# Patient Record
Sex: Female | Born: 1985
Health system: Southern US, Community
[De-identification: ages and names within clinical notes are randomized; demographics above are authoritative.]

## PROBLEM LIST (undated history)

## (undated) DIAGNOSIS — F3281 Premenstrual dysphoric disorder: Secondary | ICD-10-CM

## (undated) DIAGNOSIS — M329 Systemic lupus erythematosus, unspecified: Secondary | ICD-10-CM

## (undated) DIAGNOSIS — M069 Rheumatoid arthritis, unspecified: Secondary | ICD-10-CM

## (undated) HISTORY — DX: Premenstrual dysphoric disorder: F32.81

## (undated) HISTORY — DX: Systemic lupus erythematosus, unspecified: M32.9

## (undated) HISTORY — PX: RENAL BIOPSY: SHX156

## (undated) HISTORY — PX: WRIST SURGERY: SHX841

---
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
## 2004-03-25 ENCOUNTER — Emergency Department (HOSPITAL_COMMUNITY): Admission: EM | Admit: 2004-03-25 | Discharge: 2004-03-25 | Payer: Self-pay | Admitting: *Deleted

## 2006-03-20 ENCOUNTER — Emergency Department (HOSPITAL_COMMUNITY): Admission: EM | Admit: 2006-03-20 | Discharge: 2006-03-20 | Payer: Self-pay | Admitting: Orthopedic Surgery

## 2006-04-04 ENCOUNTER — Emergency Department (HOSPITAL_COMMUNITY): Admission: EM | Admit: 2006-04-04 | Discharge: 2006-04-04 | Payer: Self-pay | Admitting: Family Medicine

## 2008-03-20 ENCOUNTER — Encounter: Admission: RE | Admit: 2008-03-20 | Discharge: 2008-03-20 | Payer: Self-pay | Admitting: Internal Medicine

## 2008-11-19 ENCOUNTER — Emergency Department (HOSPITAL_COMMUNITY): Admission: EM | Admit: 2008-11-19 | Discharge: 2008-11-19 | Payer: Self-pay | Admitting: Emergency Medicine

## 2009-05-06 ENCOUNTER — Emergency Department (HOSPITAL_COMMUNITY): Admission: EM | Admit: 2009-05-06 | Discharge: 2009-05-06 | Payer: Self-pay | Admitting: Emergency Medicine

## 2010-08-30 LAB — POCT URINALYSIS DIP (DEVICE)
Bilirubin Urine: NEGATIVE
Glucose, UA: NEGATIVE mg/dL
Hgb urine dipstick: NEGATIVE
Nitrite: NEGATIVE
Protein, ur: NEGATIVE mg/dL
Specific Gravity, Urine: 1.02 (ref 1.005–1.030)
Urobilinogen, UA: 1 mg/dL (ref 0.0–1.0)
pH: 6.5 (ref 5.0–8.0)

## 2010-08-30 LAB — POCT PREGNANCY, URINE: Preg Test, Ur: NEGATIVE

## 2010-12-07 ENCOUNTER — Emergency Department (HOSPITAL_COMMUNITY)
Admission: EM | Admit: 2010-12-07 | Discharge: 2010-12-07 | Disposition: A | Discharge status: Home / Self Care | Payer: 59 | Attending: Emergency Medicine | Admitting: Emergency Medicine

## 2010-12-07 ENCOUNTER — Emergency Department (HOSPITAL_COMMUNITY): Payer: 59

## 2010-12-07 DIAGNOSIS — IMO0002 Reserved for concepts with insufficient information to code with codable children: Secondary | ICD-10-CM | POA: Insufficient documentation

## 2010-12-07 DIAGNOSIS — S139XXA Sprain of joints and ligaments of unspecified parts of neck, initial encounter: Secondary | ICD-10-CM | POA: Insufficient documentation

## 2010-12-07 DIAGNOSIS — Y92410 Unspecified street and highway as the place of occurrence of the external cause: Secondary | ICD-10-CM | POA: Insufficient documentation

## 2010-12-07 DIAGNOSIS — S96819A Strain of other specified muscles and tendons at ankle and foot level, unspecified foot, initial encounter: Secondary | ICD-10-CM | POA: Insufficient documentation

## 2010-12-07 DIAGNOSIS — Y998 Other external cause status: Secondary | ICD-10-CM | POA: Insufficient documentation

## 2010-12-07 DIAGNOSIS — S93499A Sprain of other ligament of unspecified ankle, initial encounter: Secondary | ICD-10-CM | POA: Insufficient documentation

## 2010-12-07 DIAGNOSIS — J45909 Unspecified asthma, uncomplicated: Secondary | ICD-10-CM | POA: Insufficient documentation

## 2010-12-07 DIAGNOSIS — S6390XA Sprain of unspecified part of unspecified wrist and hand, initial encounter: Secondary | ICD-10-CM | POA: Insufficient documentation

## 2011-10-04 ENCOUNTER — Emergency Department (HOSPITAL_COMMUNITY)
Admission: EM | Admit: 2011-10-04 | Discharge: 2011-10-04 | Disposition: A | Discharge status: Home / Self Care | Payer: BC Managed Care – PPO | Attending: Emergency Medicine | Admitting: Emergency Medicine

## 2011-10-04 ENCOUNTER — Encounter (HOSPITAL_COMMUNITY): Payer: Self-pay | Admitting: Emergency Medicine

## 2011-10-04 ENCOUNTER — Emergency Department (HOSPITAL_COMMUNITY): Payer: BC Managed Care – PPO

## 2011-10-04 DIAGNOSIS — S161XXA Strain of muscle, fascia and tendon at neck level, initial encounter: Secondary | ICD-10-CM

## 2011-10-04 DIAGNOSIS — S0990XA Unspecified injury of head, initial encounter: Secondary | ICD-10-CM | POA: Insufficient documentation

## 2011-10-04 DIAGNOSIS — Y998 Other external cause status: Secondary | ICD-10-CM | POA: Insufficient documentation

## 2011-10-04 DIAGNOSIS — J45909 Unspecified asthma, uncomplicated: Secondary | ICD-10-CM | POA: Insufficient documentation

## 2011-10-04 DIAGNOSIS — M069 Rheumatoid arthritis, unspecified: Secondary | ICD-10-CM | POA: Insufficient documentation

## 2011-10-04 DIAGNOSIS — W010XXA Fall on same level from slipping, tripping and stumbling without subsequent striking against object, initial encounter: Secondary | ICD-10-CM | POA: Insufficient documentation

## 2011-10-04 DIAGNOSIS — R51 Headache: Secondary | ICD-10-CM | POA: Insufficient documentation

## 2011-10-04 DIAGNOSIS — Y9383 Activity, rough housing and horseplay: Secondary | ICD-10-CM | POA: Insufficient documentation

## 2011-10-04 DIAGNOSIS — S139XXA Sprain of joints and ligaments of unspecified parts of neck, initial encounter: Secondary | ICD-10-CM | POA: Insufficient documentation

## 2011-10-04 HISTORY — DX: Rheumatoid arthritis, unspecified: M06.9

## 2011-10-04 MED ORDER — IBUPROFEN 800 MG PO TABS
800.0000 mg | ORAL_TABLET | Freq: Once | ORAL | Status: AC
  Administered 2011-10-04: 800 mg via ORAL
  Filled 2011-10-04: qty 1

## 2011-10-04 NOTE — ED Notes (Signed)
Patient transported to CT 

## 2011-10-04 NOTE — ED Notes (Signed)
Pt jumped up to kiss her boyfriend and fell backward hitting her head on the concrete  Denies LOC  Pt is c/o headache,neck and upper back pain

## 2011-10-04 NOTE — ED Notes (Signed)
Patient returned from CT

## 2011-10-04 NOTE — ED Provider Notes (Signed)
History     CSN: [id number]  Arrival date & time 10/04/11  0122   First MD Initiated Contact with Patient 10/04/11 0140      Chief Complaint  Patient presents with  . Fall  . Head Injury   HPI  History provided by the patient and significant other. Patient is a 26 year old female with history of asthma or presents with complaints of headache following minor head injury and fall. Patient reports playing around with her boyfriend and was jumping up to grab hold of him when she fell backwards onto her back and hitting her at on cement. There was no loss of consciousness. Patient reports having headache following injury with some neck pains. Patient also reports having some sensitivity to light. She denies any nausea or vomiting. Patient denies any dizziness or vertigo symptoms. Patient has been able to tolerate. She denies any other injury. Denies back or chest pain or difficulty breathing. Patient has not taken anything for her symptoms. Symptoms are described as moderate. She denies any other aggravating or alleviating factors.    Past Medical History  Diagnosis Date  . Asthma   . Rheumatoid arthritis     History reviewed. No pertinent past surgical history.  Family History  Problem Relation Age of Onset  . Hypertension Mother   . Arthritis Father   . Hypertension Other   . Diabetes Other   . Arthritis Other     History  Substance Use Topics  . Smoking status: Former Games developer  . Smokeless tobacco: Not on file  . Alcohol Use: Yes     social    OB History    Grav Para Term Preterm Abortions TAB SAB Ect Mult Living                  Review of Systems  Constitutional: Positive for fatigue.  HENT: Positive for neck pain.   Eyes: Negative for pain and visual disturbance.  Gastrointestinal: Negative for nausea and vomiting.  Neurological: Positive for headaches. Negative for dizziness, weakness, light-headedness and numbness.  Psychiatric/Behavioral: Negative for  confusion.    Allergies  Penicillins and Sulfa antibiotics  Home Medications  No current outpatient prescriptions on file.  LMP 09/24/2011  Physical Exam  Nursing note and vitals reviewed. Constitutional: She is oriented to person, place, and time. She appears well-developed and well-nourished. No distress.  HENT:  Head: Normocephalic.  Right Ear: Tympanic membrane normal.  Left Ear: Tympanic membrane normal.       Mild to moderate tenderness of her posterior scalp. Slight fluctuance without significant hematoma. No lacerations. No battle sign or raccoon eyes.  Eyes: Conjunctivae and EOM are normal. Pupils are equal, round, and reactive to light.  Neck: No tracheal deviation present.       Moderate midline tenderness. No step-offs or deformity.  Cardiovascular: Normal rate and regular rhythm.   Pulmonary/Chest: Effort normal and breath sounds normal. No stridor.  Musculoskeletal:       Back:  Neurological: She is alert and oriented to person, place, and time. She has normal strength. No cranial nerve deficit or sensory deficit. Coordination normal.  Skin: Skin is warm and dry. No rash noted.  Psychiatric: She has a normal mood and affect. Her behavior is normal.    ED Course  Procedures      Dg Cervical Spine Complete  10/04/2011  *RADIOLOGY REPORT*  Clinical Data: Status post fall; posterior neck pain.  CERVICAL SPINE - COMPLETE 4+ VIEW  Comparison: Cervical spine radiographs  performed 12/07/2010  Findings: There is no evidence of fracture or subluxation. Vertebral bodies demonstrate normal height and alignment. Intervertebral disc spaces are preserved.  Prevertebral soft tissues are within normal limits.  The provided odontoid view demonstrates no significant abnormality.  The visualized lung apices are clear.  IMPRESSION: No evidence of fracture or subluxation along the cervical spine.  Original Report Authenticated By: Tonia Ghent, M.D.     1. Minor head injury   2.  Neck strain       MDM  Patient seen and evaluated. Patient no acute distress. Patient with normal nonfocal neuro exam. No significant signs of trauma to the head.        Angus Seller, [place] 10/04/11 [phone]

## 2011-10-04 NOTE — ED Notes (Signed)
PA Dammen at bedside. 

## 2011-10-04 NOTE — Discharge Instructions (Signed)
You were seen and evaluated today for your head injury after a fall. Your x-rays today did not show any concerning findings or broken bones to explain your neck pains. Providers feel you have no other concerning findings on your exam. Use Tylenol or ibuprofen for aches and pains. Followup with your primary care provider for general medical care.   Head Injury, Adult You have had a head injury that does not appear serious at this time. A concussion is a state of changed mental ability, usually from a blow to the head. You should take clear liquids for the rest of the day and then resume your regular diet. You should not take sedatives or alcoholic beverages for as long as directed by your caregiver after discharge. After injuries such as yours, most problems occur within the first 24 hours. SYMPTOMS These minor symptoms may be experienced after discharge:  Memory difficulties.   Dizziness.   Headaches.   Double vision.   Hearing difficulties.   Depression.   Tiredness.   Weakness.   Difficulty with concentration.  If you experience any of these problems, you should not be alarmed. A concussion requires a few days for recovery. Many patients with head injuries frequently experience such symptoms. Usually, these problems disappear without medical care. If symptoms last for more than one day, notify your caregiver. See your caregiver sooner if symptoms are becoming worse rather than better. HOME CARE INSTRUCTIONS   During the next 24 hours you must stay with someone who can watch you for the warning signs listed below.  Although it is unlikely that serious side effects will occur, you should be aware of signs and symptoms which may necessitate your return to this location. Side effects may occur up to 7 - 10 days following the injury. It is important for you to carefully monitor your condition and contact your caregiver or seek immediate medical attention if there is a change in your  condition. SEEK IMMEDIATE MEDICAL CARE IF:   There is confusion or drowsiness.   You can not awaken the injured person.   There is nausea (feeling sick to your stomach) or continued, forceful vomiting.   You notice dizziness or unsteadiness which is getting worse, or inability to walk.   You have convulsions or unconsciousness.   You experience severe, persistent headaches not relieved by over-the-counter or prescription medicines for pain. (Do not take aspirin as this impairs clotting abilities). Take other pain medications only as directed.   You can not use arms or legs normally.   There is clear or bloody discharge from the nose or ears.  MAKE SURE YOU:   Understand these instructions.   Will watch your condition.   Will get help right away if you are not doing well or get worse.  Document Released: 05/15/2005 Document Revised: 05/04/2011 Document Reviewed: 04/02/2009 [facility] Patient Information 2012 [place], [place].   Cervical Sprain A cervical sprain is an injury in the neck in which the ligaments are stretched or torn. The ligaments are the tissues that hold the bones of the neck (vertebrae) in place.Cervical sprains can range from very mild to very severe. Most cervical sprains get better in 1 to 3 weeks, but it depends on the cause and extent of the injury. Severe cervical sprains can cause the neck vertebrae to be unstable. This can lead to damage of the spinal cord and can result in serious nervous system problems. Your caregiver will determine whether your cervical sprain is mild or severe.  CAUSES  Severe cervical sprains may be caused by:  Contact sport injuries (football, rugby, wrestling, hockey, auto racing, gymnastics, diving, martial arts, boxing).   Motor vehicle collisions.   Whiplash injuries. This means the neck is forcefully whipped backward and forward.   Falls.  Mild cervical sprains may be caused by:   Awkward positions, such as cradling a  telephone between your ear and shoulder.   Sitting in a chair that does not offer proper support.   Working at a poorly Marketing executive station.   Activities that require looking up or down for long periods of time.  SYMPTOMS   Pain, soreness, stiffness, or a burning sensation in the front, back, or sides of the neck. This discomfort may develop immediately after injury or it may develop slowly and not begin for 24 hours or more after an injury.   Pain or tenderness directly in the middle of the back of the neck.   Shoulder or upper back pain.   Limited ability to move the neck.   Headache.   Dizziness.   Weakness, numbness, or tingling in the hands or arms.   Muscle spasms.   Difficulty swallowing or chewing.   Tenderness and swelling of the neck.  DIAGNOSIS  Most of the time, your caregiver can diagnose this problem by taking your history and doing a physical exam. Your caregiver will ask about any known problems, such as arthritis in the neck or a previous neck injury. X-rays may be taken to find out if there are any other problems, such as problems with the bones of the neck. However, an X-ray often does not reveal the full extent of a cervical sprain. Other tests such as a computed tomography (CT) scan or magnetic resonance imaging (MRI) may be needed. TREATMENT  Treatment depends on the severity of the cervical sprain. Mild sprains can be treated with rest, keeping the neck in place (immobilization), and pain medicines. Severe cervical sprains need immediate immobilization and an appointment with an orthopedist or neurosurgeon. Several treatment options are available to help with pain, muscle spasms, and other symptoms. Your caregiver may prescribe:  Medicines, such as pain relievers, numbing medicines, or muscle relaxants.   Physical therapy. This can include stretching exercises, strengthening exercises, and posture training. Exercises and improved posture can help  stabilize the neck, strengthen muscles, and help stop symptoms from returning.   A neck collar to be worn for short periods of time. Often, these collars are worn for comfort. However, certain collars may be worn to protect the neck and prevent further worsening of a serious cervical sprain.  HOME CARE INSTRUCTIONS   Put ice on the injured area.   Put ice in a plastic bag.   Place a towel between your skin and the bag.   Leave the ice on for 15 to 20 minutes, 3 to 4 times a day.   Only take over-the-counter or prescription medicines for pain, discomfort, or fever as directed by your caregiver.   Keep all follow-up appointments as directed by your caregiver.   Keep all physical therapy appointments as directed by your caregiver.   If a neck collar is prescribed, wear it as directed by your caregiver.   Do not drive while wearing a neck collar.   Make any needed adjustments to your work station to promote good posture.   Avoid positions and activities that make your symptoms worse.   Warm up and stretch before being active to help prevent problems.  SEEK MEDICAL CARE IF:   Your pain is not controlled with medicine.   You are unable to decrease your pain medicine over time as planned.   Your activity level is not improving as expected.  SEEK IMMEDIATE MEDICAL CARE IF:   You develop any bleeding, stomach upset, or signs of an allergic reaction to your medicine.   Your symptoms get worse.   You develop new, unexplained symptoms.   You have numbness, tingling, weakness, or paralysis in any part of your body.  MAKE SURE YOU:   Understand these instructions.   Will watch your condition.   Will get help right away if you are not doing well or get worse.  Document Released: 03/12/2007 Document Revised: 05/04/2011 Document Reviewed: 02/15/2011 [facility] Patient Information 2012 [place], [place].    RESOURCE GUIDE  Dental Problems  Patients with Medicaid: [facility] [phone] [address]                                           [id number] Phone:  [phone]                                                  Phone:  [phone]  If unable to pay or uninsured, contact:  [facility] or [facility]. to become qualified for the adult dental clinic.  Chronic Pain Problems Contact [facility]  [phone] Patients need to be referred by their primary care doctor.  Insufficient Money for Medicine Contact [employer]:  call "211" or [facility] [phone].  No Primary Care Doctor Call [facility]  [phone] Other agencies that provide inexpensive medical care    [facility]  6076225187    [facility]  [phone]    [facility]  [phone]    [facility]  [phone]    [facility]  [phone]    [facility]  [phone]  Psychological Services [facility]  [phone] [facility]  [phone] [employer] Mental Health   [phone] (emergency services [phone])  Substance Abuse Resources [facility]  [phone] [facility] [phone] [facility] 619-044-2790 Floydene Flock [phone] Residential & Outpatient Substance Abuse Program  9311978752  Abuse/Neglect [facility] Child Abuse Hotline 220-754-8115 [facility] Child Abuse Hotline 424-420-2962 (After Hours)  Emergency Shelter [facility] ([phone]  Maternity Homes [facility] [place] [facility] 203-771-0340 [facility] [phone]  MRSA Hotline #:   364-876-7214    [facility] Resources  [facility]     [employer]                          [facility] [address] [place]                       [address]      [address]  [address]  Sela Hua Phone:  [phone]                                   Phone:  [phone]                 Phone:  [place] Phone:  [place] [phone] (After Hours)

## 2011-10-07 NOTE — ED Provider Notes (Signed)
Medical screening examination/treatment/procedure(s) were performed by non-physician practitioner and as supervising physician I was immediately available for consultation/collaboration.   Suzi Roots, MD 10/07/11 [phone]

## 2012-10-27 ENCOUNTER — Inpatient Hospital Stay (HOSPITAL_COMMUNITY)
Admission: AD | Admit: 2012-10-27 | Discharge: 2012-10-27 | Disposition: A | Discharge status: Home / Self Care | Payer: 59 | Source: Ambulatory Visit | Attending: Obstetrics and Gynecology | Admitting: Obstetrics and Gynecology

## 2012-10-27 ENCOUNTER — Encounter (HOSPITAL_COMMUNITY): Payer: Self-pay | Admitting: Family

## 2012-10-27 DIAGNOSIS — N760 Acute vaginitis: Secondary | ICD-10-CM | POA: Insufficient documentation

## 2012-10-27 DIAGNOSIS — B9689 Other specified bacterial agents as the cause of diseases classified elsewhere: Secondary | ICD-10-CM | POA: Insufficient documentation

## 2012-10-27 DIAGNOSIS — N949 Unspecified condition associated with female genital organs and menstrual cycle: Secondary | ICD-10-CM | POA: Insufficient documentation

## 2012-10-27 DIAGNOSIS — N938 Other specified abnormal uterine and vaginal bleeding: Secondary | ICD-10-CM | POA: Insufficient documentation

## 2012-10-27 DIAGNOSIS — N970 Female infertility associated with anovulation: Secondary | ICD-10-CM

## 2012-10-27 DIAGNOSIS — A499 Bacterial infection, unspecified: Secondary | ICD-10-CM | POA: Insufficient documentation

## 2012-10-27 LAB — URINALYSIS, ROUTINE W REFLEX MICROSCOPIC
Bilirubin Urine: NEGATIVE
Glucose, UA: NEGATIVE mg/dL
Ketones, ur: NEGATIVE mg/dL
Leukocytes, UA: NEGATIVE
Nitrite: NEGATIVE
Protein, ur: NEGATIVE mg/dL
Specific Gravity, Urine: 1.02 (ref 1.005–1.030)
Urobilinogen, UA: 1 mg/dL (ref 0.0–1.0)
pH: 6.5 (ref 5.0–8.0)

## 2012-10-27 LAB — URINE MICROSCOPIC-ADD ON

## 2012-10-27 LAB — WET PREP, GENITAL
Trich, Wet Prep: NONE SEEN
Yeast Wet Prep HPF POC: NONE SEEN

## 2012-10-27 LAB — POCT PREGNANCY, URINE: Preg Test, Ur: NEGATIVE

## 2012-10-27 MED ORDER — METRONIDAZOLE 500 MG PO TABS: 500.0000 mg | ORAL_TABLET | Freq: Two times a day (BID) | ORAL | Status: DC

## 2012-10-27 NOTE — MAU Note (Addendum)
Patient presents to MAU with c/o vaginal bleeding. LMP 5/14-5/22; intercourse for first time in 2 months on 5/24; began bleeding again on 5/26 and has been bleeding since.  Reports blood is very bright at times during this second bleeding. Condoms for protection. Reports lower abdominal cramping.

## 2012-10-27 NOTE — MAU Provider Note (Signed)
None     Chief Complaint:  Vaginal Bleeding   Kristin Murphy is  27 y.o. No obstetric history on file..  Patient's last menstrual period was 10/09/2012.Marland Kitchen  Her pregnancy status is negative.  She presents complaining of Vaginal Bleeding that started 3 days ago, bright red, not heavy, but persistent   Past Medical History  Diagnosis Date  . Asthma   . Rheumatoid arthritis(714.0)     No past surgical history on file.  Family History  Problem Relation Age of Onset  . Hypertension Mother   . Arthritis Father   . Hypertension Other   . Diabetes Other   . Arthritis Other     History  Substance Use Topics  . Smoking status: Former Games developer  . Smokeless tobacco: Not on file  . Alcohol Use: Yes     Comment: social    Allergies:  Allergies  Allergen Reactions  . Paba Derivatives Swelling  . Penicillins Other (See Comments)    Unknown childhood rxn  . Sulfa Antibiotics Other (See Comments)    Unknown childhood rxn    Prescriptions prior to admission  Medication Sig Dispense Refill  . albuterol (PROVENTIL HFA;VENTOLIN HFA) 108 (90 BASE) MCG/ACT inhaler Inhale 2 puffs into the lungs every 6 (six) hours as needed (asthma).      . cetirizine (ZYRTEC) 10 MG tablet Take 10 mg by mouth daily.      Marland Kitchen ibuprofen (ADVIL,MOTRIN) 200 MG tablet Take 800 mg by mouth every 6 (six) hours as needed for pain.      . naproxen sodium (ANAPROX) 220 MG tablet Take 440 mg by mouth 2 (two) times daily with a meal.        Review of Systems   Constitutional: Negative for fever and chills Eyes: Negative for visual disturbances Respiratory: Negative for shortness of breath, dyspnea Cardiovascular: Negative for chest pain or palpitations  Gastrointestinal: Negative for vomiting, diarrhea and constipation Genitourinary: Negative for dysuria and urgency Musculoskeletal: Negative for back pain, joint pain, myalgias  Neurological: Negative for dizziness and headaches     Physical Exam   Blood  pressure 122/75, pulse 63, resp. rate 18, last menstrual period 10/09/2012.  General: General appearance - alert, well appearing, and in no distress and oriented to person, place, and time Chest - clear to auscultation, no wheezes, rales or rhonchi, symmetric air entry Heart - normal rate and regular rhythm Abdomen - soft, nontender, nondistended, no masses or organomegaly Pelvic - SSE:  Small amount of bright to dark red blood coming from cervical os.  CX non friabale.  WET MOUNT done - results: clue cells,  Extremities - no pedal edema noted   Labs: Results for orders placed during the hospital encounter of 10/27/12 (from the past 24 hour(s))  URINALYSIS, ROUTINE W REFLEX MICROSCOPIC   Collection Time    10/27/12  4:00 PM      Result Value Range   Color, Urine YELLOW  YELLOW   APPearance CLEAR  CLEAR   Specific Gravity, Urine 1.020  1.005 - 1.030   pH 6.5  5.0 - 8.0   Glucose, UA NEGATIVE  NEGATIVE mg/dL   Hgb urine dipstick LARGE (*) NEGATIVE   Bilirubin Urine NEGATIVE  NEGATIVE   Ketones, ur NEGATIVE  NEGATIVE mg/dL   Protein, ur NEGATIVE  NEGATIVE mg/dL   Urobilinogen, UA 1.0  0.0 - 1.0 mg/dL   Nitrite NEGATIVE  NEGATIVE   Leukocytes, UA NEGATIVE  NEGATIVE  URINE MICROSCOPIC-ADD ON   Collection  Time    10/27/12  4:00 PM      Result Value Range   Squamous Epithelial / LPF RARE  RARE   RBC / HPF 0-2  <3 RBC/hpf   Urine-Other MUCOUS PRESENT    POCT PREGNANCY, URINE   Collection Time    10/27/12  4:31 PM      Result Value Range   Preg Test, Ur NEGATIVE  NEGATIVE  WET PREP, GENITAL   Collection Time    10/27/12  4:38 PM      Result Value Range   Yeast Wet Prep HPF POC NONE SEEN  NONE SEEN   Trich, Wet Prep NONE SEEN  NONE SEEN   Clue Cells Wet Prep HPF POC MANY (*) NONE SEEN   WBC, Wet Prep HPF POC FEW (*) NONE SEEN   Imaging Studies:  No results found.   Assessment: Most likely anovulatory cycle Incidental BV  Plan: Expectant management for now. Consider  u/s to r/o structural source of mid cycle bleeding if becomes regular Treat BV with flagyl  Kristin Murphy,Kristin Murphy

## 2012-10-27 NOTE — MAU Provider Note (Signed)
Attestation of Attending Supervision of Advanced Practitioner: Evaluation and management procedures were performed by the PA/NP/CNM/OB Fellow under my supervision/collaboration. Chart reviewed and agree with management and plan.  Tilda Burrow 10/27/2012 6:33 PM

## 2012-10-28 LAB — GC/CHLAMYDIA PROBE AMP
CT Probe RNA: NEGATIVE
GC Probe RNA: NEGATIVE

## 2014-09-27 ENCOUNTER — Encounter (HOSPITAL_COMMUNITY): Payer: Self-pay | Admitting: Emergency Medicine

## 2014-09-27 ENCOUNTER — Emergency Department (INDEPENDENT_AMBULATORY_CARE_PROVIDER_SITE_OTHER)
Admission: EM | Admit: 2014-09-27 | Discharge: 2014-09-27 | Disposition: A | Discharge status: Home / Self Care | Payer: Self-pay | Source: Home / Self Care | Attending: Family Medicine | Admitting: Family Medicine

## 2014-09-27 DIAGNOSIS — M545 Low back pain, unspecified: Secondary | ICD-10-CM

## 2014-09-27 DIAGNOSIS — S338XXA Sprain of other parts of lumbar spine and pelvis, initial encounter: Secondary | ICD-10-CM

## 2014-09-27 DIAGNOSIS — S39012A Strain of muscle, fascia and tendon of lower back, initial encounter: Secondary | ICD-10-CM

## 2014-09-27 MED ORDER — MELOXICAM 15 MG PO TABS: 15.0000 mg | ORAL_TABLET | Freq: Every day | ORAL | Status: DC

## 2014-09-27 MED ORDER — DICLOFENAC SODIUM 1 % TD GEL: 1.0000 "application " | Freq: Four times a day (QID) | TRANSDERMAL | Status: DC

## 2014-09-27 MED ORDER — KETOROLAC TROMETHAMINE 60 MG/2ML IM SOLN
INTRAMUSCULAR | Status: AC
  Filled 2014-09-27: qty 2

## 2014-09-27 MED ORDER — CYCLOBENZAPRINE HCL 5 MG PO TABS: 5.0000 mg | ORAL_TABLET | Freq: Three times a day (TID) | ORAL | Status: DC | PRN

## 2014-09-27 MED ORDER — KETOROLAC TROMETHAMINE 60 MG/2ML IM SOLN
60.0000 mg | Freq: Once | INTRAMUSCULAR | Status: AC
  Administered 2014-09-27: 60 mg via INTRAMUSCULAR

## 2014-09-27 NOTE — ED Provider Notes (Signed)
CSN: [id number]     Arrival date & time 09/27/14  1439 History   First MD Initiated Contact with Patient 09/27/14 1537     Chief Complaint  Patient presents with  . 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 It occurred yesterday. She states that she would not know where to begin to describe what may have caused her back pain. She states is worse with movement, bending, twisting and ambulation. She denies focal paresthesias or weakness.   Past Medical History  Diagnosis Date  . Asthma   . Rheumatoid arthritis(714.0)    History reviewed. No pertinent past surgical history. Family History  Problem Relation Age of Onset  . Hypertension Mother   . Arthritis Father   . Hypertension Other   . Diabetes Other   . Arthritis Other    History  Substance Use Topics  . Smoking status: Former Games developer  . Smokeless tobacco: Not on file  . Alcohol Use: Yes     Comment: social   OB History    No data available     Review of Systems  Constitutional: Positive for activity change. Negative for fever.  Respiratory: Negative.   Cardiovascular: Negative for chest pain.  Gastrointestinal: Negative.   Genitourinary: Negative.   Musculoskeletal: Positive for back pain. Negative for joint swelling, neck pain and neck stiffness.  Skin: Negative.     Allergies  Paba derivatives; Penicillins; and Sulfa antibiotics  Home Medications   Prior to Admission medications   Medication Sig Start Date End Date Taking? Authorizing Provider  albuterol (PROVENTIL HFA;VENTOLIN HFA) 108 (90 BASE) MCG/ACT inhaler Inhale 2 puffs into the lungs every 6 (six) hours as needed (asthma).    Historical Provider, MD  cetirizine (ZYRTEC) 10 MG tablet Take 10 mg by mouth daily.    Historical Provider, MD   cyclobenzaprine (FLEXERIL) 5 MG tablet Take 1 tablet (5 mg total) by mouth 3 (three) times daily as needed for muscle spasms. This may cause drowsiness 09/27/14   Hayden Rasmussen, NP  diclofenac sodium (VOLTAREN) 1 % GEL Apply 1 application topically 4 (four) times daily. 09/27/14   Hayden Rasmussen, NP  ibuprofen (ADVIL,MOTRIN) 200 MG tablet Take 800 mg by mouth every 6 (six) hours as needed for pain.    Historical Provider, MD  meloxicam (MOBIC) 15 MG tablet Take 1 tablet (15 mg total) by mouth daily. 09/27/14   Hayden Rasmussen, NP  metroNIDAZOLE (FLAGYL) 500 MG tablet Take 1 tablet (500 mg total) by mouth 2 (two) times daily. 10/27/12   Archie Patten, CNM  naproxen sodium (ANAPROX) 220 MG tablet Take 440 mg by mouth 2 (two) times daily with a meal.    Historical Provider, MD   BP 132/72 mmHg  Pulse 63  Temp(Src) 97.8 F (36.6 C) (Oral)  Resp 20  SpO2 100%  LMP 09/09/2014 Physical Exam  Constitutional: She is oriented to person, place, and time. She appears well-developed and well-nourished. No distress.  Neck: Normal range of motion. Neck supple.  Cardiovascular: Normal rate.   Pulmonary/Chest: Effort normal. No respiratory distress.  Musculoskeletal: She exhibits tenderness. She exhibits no edema.  Tenderness across the lower paralumbar musculature. The patient preferred not to test range of motion due to pain Ambulatory moves lower extremities as directed. No palpable spinal deformities.  Neurological: She is alert and oriented to  person, place, and time.  Skin: Skin is warm and dry.  Psychiatric: She has a normal mood and affect.  Nursing note and vitals reviewed.   ED Course  Procedures (including critical care time) Labs Review Labs Reviewed - No data to display  Imaging Review No results found.   MDM   1. Bilateral low back pain without sciatica   2. Lumbosacral strain, initial encounter    toradol 60 mg IM Diclofenac gel Ice then heat Flexeril with sedation prec. Stretches.  Limit back activity See your PCP as needed    Hayden Rasmussen, NP 09/27/14 1611

## 2014-09-27 NOTE — ED Notes (Signed)
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

## 2014-09-27 NOTE — Discharge Instructions (Signed)
Back Pain, Adult Low back pain is very common. About 1 in 5 people have back pain.The cause of low back pain is rarely dangerous. The pain often gets better over time.About half of people with a sudden onset of back pain feel better in just 2 weeks. About 8 in 10 people feel better by 6 weeks.  CAUSES Some common causes of back pain include:  Strain of the muscles or ligaments supporting the spine.  Wear and tear (degeneration) of the spinal discs.  Arthritis.  Direct injury to the back. DIAGNOSIS Most of the time, the direct cause of low back pain is not known.However, back pain can be treated effectively even when the exact cause of the pain is unknown.Answering your caregiver's questions about your overall health and symptoms is one of the most accurate ways to make sure the cause of your pain is not dangerous. If your caregiver needs more information, he or she may order lab work or imaging tests (X-rays or MRIs).However, even if imaging tests show changes in your back, this usually does not require surgery. HOME CARE INSTRUCTIONS For many people, back pain returns.Since low back pain is rarely dangerous, it is often a condition that people can learn to [facility] their own.   Remain active. It is stressful on the back to sit or stand in one place. Do not sit, drive, or stand in one place for more than 30 minutes at a time. Take short walks on level surfaces as soon as pain allows.Try to increase the length of time you walk each day.  Do not stay in bed.Resting more than 1 or 2 days can delay your recovery.  Do not avoid exercise or work.Your body is made to move.It is not dangerous to be active, even though your back may hurt.Your back will likely heal faster if you return to being active before your pain is gone.  Pay attention to your body when you bend and lift. Many people have less discomfortwhen lifting if they bend their knees, keep the load close to their bodies,and  avoid twisting. Often, the most comfortable positions are those that put less stress on your recovering back.  Find a comfortable position to sleep. Use a firm mattress and lie on your side with your knees slightly bent. If you lie on your back, put a pillow under your knees.  Only take over-the-counter or prescription medicines as directed by your caregiver. Over-the-counter medicines to reduce pain and inflammation are often the most helpful.Your caregiver may prescribe muscle relaxant drugs.These medicines help dull your pain so you can more quickly return to your normal activities and healthy exercise.  Put ice on the injured area.  Put ice in a plastic bag.  Place a towel between your skin and the bag.  Leave the ice on for 15-20 minutes, 03-04 times a day for the first 2 to 3 days. After that, ice and heat may be alternated to reduce pain and spasms.  Ask your caregiver about trying back exercises and gentle massage. This may be of some benefit.  Avoid feeling anxious or stressed.Stress increases muscle tension and can worsen back pain.It is important to recognize when you are anxious or stressed and learn ways to manage it.Exercise is a great option. SEEK MEDICAL CARE IF:  You have pain that is not relieved with rest or medicine.  You have pain that does not improve in 1 week.  You have new symptoms.  You are generally not feeling well. SEEK  IMMEDIATE MEDICAL CARE IF:   You have pain that radiates from your back into your legs.  You develop new bowel or bladder control problems.  You have unusual weakness or numbness in your arms or legs.  You develop nausea or vomiting.  You develop abdominal pain.  You feel faint. Document Released: 05/15/2005 Document Revised: 11/14/2011 Document Reviewed: 09/16/2013 [facility] Patient Information 2015 [place], [place]. This information is not intended to replace advice given to you by your health care provider. Make sure you  discuss any questions you have with your health care provider.  Lumbosacral Strain Lumbosacral strain is a strain of any of the parts that make up your lumbosacral vertebrae. Your lumbosacral vertebrae are the bones that make up the lower third of your backbone. Your lumbosacral vertebrae are held together by muscles and tough, fibrous tissue (ligaments).  CAUSES  A sudden blow to your back can cause lumbosacral strain. Also, anything that causes an excessive stretch of the muscles in the low back can cause this strain. This is typically seen when people exert themselves strenuously, fall, lift heavy objects, bend, or crouch repeatedly. RISK FACTORS  Physically demanding work.  Participation in pushing or pulling sports or sports that require a sudden twist of the back (tennis, golf, baseball).  Weight lifting.  Excessive lower back curvature.  Forward-tilted pelvis.  Weak back or abdominal muscles or both.  Tight hamstrings. SIGNS AND SYMPTOMS  Lumbosacral strain may cause pain in the area of your injury or pain that moves (radiates) down your leg.  DIAGNOSIS Your health care provider can often diagnose lumbosacral strain through a physical exam. In some cases, you may need tests such as X-ray exams.  TREATMENT  Treatment for your lower back injury depends on many factors that your clinician will have to evaluate. However, most treatment will include the use of anti-inflammatory medicines. HOME CARE INSTRUCTIONS   Avoid hard physical activities (tennis, racquetball, waterskiing) if you are not in proper physical condition for it. This may aggravate or create problems.  If you have a back problem, avoid sports requiring sudden body movements. Swimming and walking are generally safer activities.  Maintain good posture.  Maintain a healthy weight.  For acute conditions, you may put ice on the injured area.  Put ice in a plastic bag.  Place a towel between your skin and the  bag.  Leave the ice on for 20 minutes, 2-3 times a day.  When the low back starts healing, stretching and strengthening exercises may be recommended. SEEK MEDICAL CARE IF:  Your back pain is getting worse.  You experience severe back pain not relieved with medicines. SEEK IMMEDIATE MEDICAL CARE IF:   You have numbness, tingling, weakness, or problems with the use of your arms or legs.  There is a change in bowel or bladder control.  You have increasing pain in any area of the body, including your belly (abdomen).  You notice shortness of breath, dizziness, or feel faint.  You feel sick to your stomach (nauseous), are throwing up (vomiting), or become sweaty.  You notice discoloration of your toes or legs, or your feet get very cold. MAKE SURE YOU:   Understand these instructions.  Will watch your condition.  Will get help right away if you are not doing well or get worse. Document Released: 02/22/2005 Document Revised: 05/20/2013 Document Reviewed: 01/01/2013 [facility] Patient Information 2015 [place], [place]. This information is not intended to replace advice given to you by your health care provider.  Make sure you discuss any questions you have with your health care provider.  Muscle Strain A muscle strain (pulled muscle) happens when a muscle is stretched beyond normal length. It happens when a sudden, violent force stretches your muscle too far. Usually, a few of the fibers in your muscle are torn. Muscle strain is common in athletes. Recovery usually takes 1-2 weeks. Complete healing takes 5-6 weeks.  HOME CARE   Follow the PRICE method of treatment to help your injury get better. Do this the first 2-3 days after the injury:  Protect. Protect the muscle to keep it from getting injured again.  Rest. Limit your activity and rest the injured body part.  Ice. Put ice in a plastic bag. Place a towel between your skin and the bag. Then, apply the ice and leave it on from  15-20 minutes each hour. After the third day, switch to moist heat packs.  Compression. Use a splint or elastic bandage on the injured area for comfort. Do not put it on too tightly.  Elevate. Keep the injured body part above the level of your heart.  Only take medicine as told by your doctor.  Warm up before doing exercise to prevent future muscle strains. GET HELP IF:   You have more pain or puffiness (swelling) in the injured area.  You feel numbness, tingling, or notice a loss of strength in the injured area. MAKE SURE YOU:   Understand these instructions.  Will watch your condition.  Will get help right away if you are not doing well or get worse. Document Released: 02/22/2008 Document Revised: 03/05/2013 Document Reviewed: 12/12/2012 [facility] Patient Information 2015 [place], [place]. This information is not intended to replace advice given to you by your health care provider. Make sure you discuss any questions you have with your health care provider.

## 2015-11-18 ENCOUNTER — Other Ambulatory Visit: Payer: Self-pay | Admitting: Obstetrics & Gynecology

## 2015-11-18 ENCOUNTER — Other Ambulatory Visit (HOSPITAL_COMMUNITY)
Admission: RE | Admit: 2015-11-18 | Discharge: 2015-11-18 | Disposition: A | Discharge status: Home / Self Care | Payer: BC Managed Care – PPO | Source: Ambulatory Visit | Attending: Obstetrics & Gynecology | Admitting: Obstetrics & Gynecology

## 2015-11-18 DIAGNOSIS — Z113 Encounter for screening for infections with a predominantly sexual mode of transmission: Secondary | ICD-10-CM | POA: Insufficient documentation

## 2015-11-18 DIAGNOSIS — Z01419 Encounter for gynecological examination (general) (routine) without abnormal findings: Secondary | ICD-10-CM | POA: Insufficient documentation

## 2015-11-18 DIAGNOSIS — Z1151 Encounter for screening for human papillomavirus (HPV): Secondary | ICD-10-CM | POA: Insufficient documentation

## 2015-11-23 LAB — CYTOLOGY - PAP

## 2017-08-03 ENCOUNTER — Emergency Department (HOSPITAL_COMMUNITY): Payer: Self-pay

## 2017-08-03 ENCOUNTER — Other Ambulatory Visit: Payer: Self-pay

## 2017-08-03 ENCOUNTER — Encounter (HOSPITAL_COMMUNITY): Payer: Self-pay

## 2017-08-03 ENCOUNTER — Emergency Department (HOSPITAL_COMMUNITY)
Admission: EM | Admit: 2017-08-03 | Discharge: 2017-08-03 | Disposition: A | Discharge status: Home / Self Care | Payer: Self-pay | Attending: Emergency Medicine | Admitting: Emergency Medicine

## 2017-08-03 DIAGNOSIS — J45901 Unspecified asthma with (acute) exacerbation: Secondary | ICD-10-CM | POA: Insufficient documentation

## 2017-08-03 DIAGNOSIS — Z87891 Personal history of nicotine dependence: Secondary | ICD-10-CM | POA: Insufficient documentation

## 2017-08-03 DIAGNOSIS — Z79899 Other long term (current) drug therapy: Secondary | ICD-10-CM | POA: Insufficient documentation

## 2017-08-03 DIAGNOSIS — M069 Rheumatoid arthritis, unspecified: Secondary | ICD-10-CM | POA: Insufficient documentation

## 2017-08-03 LAB — I-STAT BETA HCG BLOOD, ED (MC, WL, AP ONLY)

## 2017-08-03 LAB — I-STAT CHEM 8, ED
BUN: 9 mg/dL (ref 6–20)
CALCIUM ION: 1.15 mmol/L (ref 1.15–1.40)
CREATININE: 0.7 mg/dL (ref 0.44–1.00)
Chloride: 104 mmol/L (ref 101–111)
GLUCOSE: 87 mg/dL (ref 65–99)
HCT: 43 % (ref 36.0–46.0)
HEMOGLOBIN: 14.6 g/dL (ref 12.0–15.0)
Potassium: 3.4 mmol/L — ABNORMAL LOW (ref 3.5–5.1)
Sodium: 143 mmol/L (ref 135–145)
TCO2: 25 mmol/L (ref 22–32)

## 2017-08-03 LAB — I-STAT TROPONIN, ED: TROPONIN I, POC: 0 ng/mL (ref 0.00–0.08)

## 2017-08-03 MED ORDER — IPRATROPIUM-ALBUTEROL 0.5-2.5 (3) MG/3ML IN SOLN
3.0000 mL | Freq: Once | RESPIRATORY_TRACT | Status: AC
  Administered 2017-08-03: 3 mL via RESPIRATORY_TRACT
  Filled 2017-08-03: qty 3

## 2017-08-03 MED ORDER — ALBUTEROL SULFATE ER 4 MG PO TB12: 4.0000 mg | ORAL_TABLET | Freq: Two times a day (BID) | ORAL | 0 refills | Status: DC

## 2017-08-03 MED ORDER — GUAIFENESIN ER 600 MG PO TB12: 600.0000 mg | ORAL_TABLET | Freq: Two times a day (BID) | ORAL | 0 refills | Status: DC

## 2017-08-03 MED ORDER — IPRATROPIUM BROMIDE 0.02 % IN SOLN: 0.5000 mg | Freq: Once | RESPIRATORY_TRACT | Status: DC

## 2017-08-03 NOTE — ED Provider Notes (Signed)
[facility]-EMERGENCY DEPT Provider Note   CSN: [id number] Arrival date & time: 08/03/17  1258     History   Chief Complaint Chief Complaint  Patient presents with  . Cough  . Sore Throat  . Asthma    HPI Kristin Murphy is a 32 y.o. female.  HPI  Is a 32 year old female with history of asthma presents the ED complaining of cough, shortness of breath, wheezing, central chest pain that began 5 days ago.  Reports cough has intermittently been productive with yellow/green sputum. No hemoptysis. She also reports sweats, chills, body aches, sore throat that began 3 days ago.  No difficulty swallowing or changes in her voice.  Patient states that she has tried using her albuterol inhaler at home, however she states it gives her chest pain so she does not like to use it very often.  Denies leg pain/swelling, hemoptysis, recent surgery/trauma, recent long travel, hormone use, personal hx of cancer, or hx of DVT/PE.   Pt states she is not currently being tx for RA.   Past Medical History:  Diagnosis Date  . Asthma   . Rheumatoid arthritis(714.0)     There are no active problems to display for this patient.   History reviewed. No pertinent surgical history.  OB History    No data available       Home Medications    Prior to Admission medications   Medication Sig Start Date End Date Taking? Authorizing Provider  albuterol (VOSPIRE ER) 4 MG 12 hr tablet Take 1 tablet (4 mg total) by mouth 2 (two) times daily. You may take 1 tablet by mouth every 12 hours as needed for shortness of breath and wheezing. 08/03/17   Kaan Tosh S, PA-C  cetirizine (ZYRTEC) 10 MG tablet Take 10 mg by mouth daily.    [provider]  cyclobenzaprine (FLEXERIL) 5 MG tablet Take 1 tablet (5 mg total) by mouth 3 (three) times daily as needed for muscle spasms. This may cause drowsiness 09/27/14   Hayden Rasmussen, NP  diclofenac sodium (VOLTAREN) 1 % GEL Apply 1 application  topically 4 (four) times daily. 09/27/14   Hayden Rasmussen, NP  guaiFENesin (MUCINEX) 600 MG 12 hr tablet Take 1 tablet (600 mg total) by mouth 2 (two) times daily. 08/03/17   Merriel Zinger S, PA-C  ibuprofen (ADVIL,MOTRIN) 200 MG tablet Take 800 mg by mouth every 6 (six) hours as needed for pain.    [provider]  meloxicam (MOBIC) 15 MG tablet Take 1 tablet (15 mg total) by mouth daily. 09/27/14   Hayden Rasmussen, NP  metroNIDAZOLE (FLAGYL) 500 MG tablet Take 1 tablet (500 mg total) by mouth 2 (two) times daily. 10/27/12   Archie Patten, CNM  naproxen sodium (ANAPROX) 220 MG tablet Take 440 mg by mouth 2 (two) times daily with a meal.    [provider]    Family History Family History  Problem Relation Age of Onset  . Hypertension Mother   . Arthritis Father   . Hypertension Other   . Diabetes Other   . Arthritis Other     Social History Social History   Tobacco Use  . Smoking status: Former Smoker  Substance Use Topics  . Alcohol use: Yes    Comment: social  . Drug use: No     Allergies   Paba derivatives; Penicillins; and Sulfa antibiotics   Review of Systems Review of Systems  Constitutional: Positive for chills.  HENT: Positive  for congestion, postnasal drip, rhinorrhea and sore throat. Negative for ear pain, trouble swallowing and voice change.   Eyes: Negative for visual disturbance.  Respiratory: Positive for cough, shortness of breath and wheezing.   Cardiovascular: Positive for chest pain. Negative for palpitations and leg swelling.  Gastrointestinal: Negative for abdominal pain, constipation, diarrhea, nausea and vomiting.  Genitourinary: Negative for flank pain.  Musculoskeletal: Positive for myalgias.  Skin: Negative for rash.  Neurological: Negative for dizziness, syncope, weakness and light-headedness.     Physical Exam Updated Vital Signs BP 116/77 (BP Location: Left Arm)   Pulse 78   Temp 98.2 F (36.8 C) (Oral)   Resp 20   LMP  07/25/2017 (Approximate)   SpO2 100%   Physical Exam  Constitutional: She appears well-developed and well-nourished. No distress.  Nontoxic, NAD  HENT:  Head: Normocephalic and atraumatic.  Right Ear: Hearing, tympanic membrane and ear canal normal. No middle ear effusion.  Left Ear: Hearing, tympanic membrane and ear canal normal.  No middle ear effusion.  Mouth/Throat: Uvula is midline and mucous membranes are normal. No oral lesions. No uvula swelling. Posterior oropharyngeal erythema present. No oropharyngeal exudate, posterior oropharyngeal edema or tonsillar abscesses. Tonsils are 1+ on the right. Tonsils are 1+ on the left. No tonsillar exudate.  Eyes: Conjunctivae and EOM are normal. Pupils are equal, round, and reactive to light.  Neck: Normal range of motion. Neck supple.  Cardiovascular: Normal rate, regular rhythm, normal heart sounds and intact distal pulses.  No murmur heard. Pulmonary/Chest: Effort normal. No stridor. No respiratory distress. She has wheezes. She has no rales.  Apnea, speaking in full sentences.  Chest tenderness to mid and bilateral upper chest which reproduces her pain.  Abdominal: Soft. There is no tenderness.  Musculoskeletal: She exhibits no edema.  No calf TTP, erythema, swelling.  Neurological: She is alert.  Skin: Skin is warm and dry. Capillary refill takes less than 2 seconds.  Psychiatric: She has a normal mood and affect.  Nursing note and vitals reviewed.    ED Treatments / Results  Labs (all labs ordered are listed, but only abnormal results are displayed) Labs Reviewed  I-STAT CHEM 8, ED - Abnormal; Notable for the following components:      Result Value   Potassium 3.4 (*)    All other components within normal limits  I-STAT BETA HCG BLOOD, ED ([facility], [facility], [facility] ONLY)  I-STAT TROPONIN, ED    EKG  EKG Interpretation  Date/Time:  Friday August 03 2017 13:59:10 EST Ventricular Rate:  75 PR Interval:    QRS Duration: 77 QT  Interval:  394 QTC Calculation: 441 R Axis:   69 Text Interpretation:  Sinus rhythm normal. no change from previous Confirmed by Arby Barrette [phone]) on 08/03/2017 2:06:51 PM       Radiology Dg Chest 2 View  Result Date: 08/03/2017 CLINICAL DATA:  Chest pain and cough for 4 days EXAM: CHEST - 2 VIEW COMPARISON:  03/20/2006 FINDINGS: The heart size and mediastinal contours are within normal limits. Both lungs are clear. The visualized skeletal structures are unremarkable. IMPRESSION: No active cardiopulmonary disease. Electronically Signed   By: Elige Ko   On: 08/03/2017 13:50    Procedures Procedures (including critical care time)  Medications Ordered in ED Medications  ipratropium-albuterol (DUONEB) 0.5-2.5 (3) MG/3ML nebulizer solution 3 mL (3 mLs Nebulization Given 08/03/17 1414)     Initial Impression / Assessment and Plan / ED Course  I have reviewed the triage vital signs  and the nursing notes.  Pertinent labs & imaging results that were available during my care of the patient were reviewed by me and considered in my medical decision making (see chart for details).      Final Clinical Impressions(s) / ED Diagnoses   Final diagnoses:  Mild asthma with exacerbation, unspecified whether persistent   32 year-old female with history of asthma presenting with likely asthma exacerbation in setting of flulike illness.  Also complaining of chest pain to mid and bilat upper chest worse with coughing. Reproducible on exam.   I-STAT Chem-8 within normal limits.  Troponin negative.  EKG without ischemic changes.  Chest x-ray negative for infiltrates. Heart score 1.  Well score for PE 0.  Doubt PE as no tachycardia, sats improved with neb tx. no reported risk factors.  DuoNeb given in the ED and patient reports breathing is back to normal and is at baseline.  Repeat pulmonary exam reveals mild extrpiraTory wheezes in the right upper lung, but good air exchange throughout.  Patient  speaking in full sentences and is in no respiratory distress.  Pulse ox 100% on room air at dc.  Will send patient home with oral albuterol.  She states she does not want steroids as she cannot tolerate them.  Advised to follow-up with PCP in 1 week, return precautions given for any new or worsening symptoms.  Patient given opportunity ask questions, all questions answered and patient understands plan.  ED Discharge Orders        Ordered    albuterol (VOSPIRE ER) 4 MG 12 hr tablet  2 times daily,   Status:  Discontinued     08/03/17 1507    guaiFENesin (MUCINEX) 600 MG 12 hr tablet  2 times daily,   Status:  Discontinued     08/03/17 1507    albuterol (VOSPIRE ER) 4 MG 12 hr tablet  2 times daily     08/03/17 1507    guaiFENesin (MUCINEX) 600 MG 12 hr tablet  2 times daily     08/03/17 1507       Vittoria Noreen S, PA-C 08/03/17 1523    Arby Barrette, MD 08/04/17 0725

## 2017-08-03 NOTE — ED Triage Notes (Addendum)
Pt reports cough and sore throat since Monday. She states that it started like a normal asthma flare-up, but it has not improved. She reports some nasal drainage and chest pain starting around Wednesday. Also reports tactile fever at home. She states that she thinks her chest aching is from the coughing, but she wants it checked out. A&Ox4.   Pt has a spot on her thumb that she wants looked at.

## 2017-08-03 NOTE — Discharge Instructions (Signed)
You are given a prescription for albuterol tablets which he should take as directed.  I also gave you a perception for Mucinex which you should take with a full glass of water.  If any of these medications give you side effects that you cannot tolerate, please stop taking the medication.  Please follow-up with the [facility] wellness clinic to establish primary care, would like to be seen within 1 week if possible.  Please return to the emergency department for any new or worsening symptoms including any persistent shortness of breath, chest pain, fevers.

## 2017-08-30 ENCOUNTER — Encounter (HOSPITAL_COMMUNITY): Payer: Self-pay | Admitting: Family Medicine

## 2017-08-30 ENCOUNTER — Ambulatory Visit (HOSPITAL_COMMUNITY)
Admission: EM | Admit: 2017-08-30 | Discharge: 2017-08-30 | Disposition: A | Discharge status: Home / Self Care | Payer: Self-pay | Attending: Family Medicine | Admitting: Family Medicine

## 2017-08-30 DIAGNOSIS — M79674 Pain in right toe(s): Secondary | ICD-10-CM

## 2017-08-30 DIAGNOSIS — S99921A Unspecified injury of right foot, initial encounter: Secondary | ICD-10-CM
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
## 2017-08-30 NOTE — Discharge Instructions (Addendum)
Wear cast shoe until you can wear your normal shoes. Ibuprofen as needed.

## 2017-11-08 ENCOUNTER — Ambulatory Visit (HOSPITAL_COMMUNITY)
Admission: EM | Admit: 2017-11-08 | Discharge: 2017-11-08 | Disposition: A | Discharge status: Home / Self Care | Payer: Self-pay | Attending: Family Medicine | Admitting: Family Medicine

## 2017-11-08 ENCOUNTER — Ambulatory Visit (INDEPENDENT_AMBULATORY_CARE_PROVIDER_SITE_OTHER): Payer: Self-pay

## 2017-11-08 ENCOUNTER — Encounter (HOSPITAL_COMMUNITY): Payer: Self-pay | Admitting: Emergency Medicine

## 2017-11-08 DIAGNOSIS — M79671 Pain in right foot: Secondary | ICD-10-CM

## 2017-11-08 NOTE — Discharge Instructions (Addendum)
You may use over the counter ibuprofen or acetaminophen as needed.  ° °

## 2017-11-08 NOTE — ED Provider Notes (Signed)
[facility]   [id number] 11/08/17 Arrival Time: 1003  ASSESSMENT & PLAN:  1. Right foot pain     Imaging: Dg Foot Complete Right  Result Date: 11/08/2017 CLINICAL DATA:  Lateral foot discomfort. EXAM: RIGHT FOOT COMPLETE - 3+ VIEW COMPARISON:  None. FINDINGS: There is no evidence of fracture or dislocation. There is no evidence of arthropathy or other focal bone abnormality. Soft tissues are unremarkable. IMPRESSION: Negative. Electronically Signed   By: Charlett Nose M.D.   On: 11/08/2017 11:05   Likely strain from overuse/dancing. Will limit. OTC analgesics as needed.  Follow-up Information    [facility].   Specialty:  Urgent Care Why:  As needed. Contact information: [address] [place] [address] [phone]         Reviewed expectations re: course of current medical issues. Questions answered. Outlined signs and symptoms indicating need for more acute intervention. Patient verbalized understanding. After Visit Summary given.  SUBJECTIVE: History from: patient. Kristin Murphy is a 32 y.o. female who reports distal/lateral R foot discomfort. I saw her back in April with a suspected R 5th toe fracture. She wore cast shoe and was better in 5-6 weeks. 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 Now with 'just some discomfort'. Ambulatory without difficulty. No direct trauma reported. No extremity sensation changes or weakness. More discomfort after dancing or after prolonged ambulation.  ROS: As per HPI.   OBJECTIVE:  Vitals:   11/08/17 1031  BP: (!) 161/97  Pulse: (!) 57  Resp: 16  Temp: 98 F (36.7 C)  SpO2: 99%    General appearance: alert; no distress Extremities: warm and well perfused; symmetrical with no gross deformities; poorly localized tenderness over R 5th toe extending over distal 5th metatarsal; no bruising or swelling CV: normal extremity capillary refill Skin: warm  and dry Neurologic: normal gait; normal symmetric reflexes in all extremities; normal sensation in all extremities Psychological: alert and cooperative; normal mood and affect  Allergies  Allergen Reactions  . Paba Derivatives Swelling  . Penicillins Other (See Comments)    Unknown childhood rxn  . Sulfa Antibiotics Other (See Comments)    Unknown childhood rxn    Past Medical History:  Diagnosis Date  . Asthma   . Rheumatoid arthritis(714.0)    Social History   Socioeconomic History  . Marital status: Single    Spouse name: Not on file  . Number of children: Not on file  . Years of education: Not on file  . Highest education level: Not on file  Occupational History  . Not on file  Social Needs  . Financial resource strain: Not on file  . Food insecurity:    Worry: Not on file    Inability: Not on file  . Transportation needs:    Medical: Not on file    Non-medical: Not on file  Tobacco Use  . Smoking status: Former Smoker  Substance and Sexual Activity  . Alcohol use: Yes    Comment: social  . Drug use: No  . Sexual activity: Not on file  Lifestyle  . Physical activity:    Days per week: Not on file    Minutes per session: Not on file  . Stress: Not on file  Relationships  . Social connections:    Talks on phone: Not on file    Gets together: Not on file    Attends religious service: Not on file  Active member of club or organization: Not on file    Attends meetings of clubs or organizations: Not on file    Relationship status: Not on file  . Intimate partner violence:    Fear of current or ex partner: Not on file    Emotionally abused: Not on file    Physically abused: Not on file    Forced sexual activity: Not on file  Other Topics Concern  . Not on file  Social History Narrative  . Not on file   Family History  Problem Relation Age of Onset  . Hypertension Mother   . Arthritis Father   . Hypertension Other   . Diabetes Other   . Arthritis  Other    History reviewed. No pertinent surgical history.    Mardella Layman, MD 11/08/17 1115

## 2017-11-08 NOTE — ED Triage Notes (Signed)
Pt states she broke her toe back in April and her job requires her a note saying she can go back to work, pt states she is on her feet a lot and its sore.

## 2019-08-26 NOTE — Progress Notes (Signed)
Office Visit Note  Patient: Kristin Murphy             Date of Birth: [birth year]           MRN: [id number]             PCP: Patient, No Pcp Per Referring: Beverley Fiedler, * Visit Date: 09/02/2019 Occupation: @GUAROCC @  Subjective:  New Patient (Initial Visit) (Abnormal labs)   History of Present Illness: Kristin Murphy is a 34 y.o. female seen in consultation per request of her PCP for evaluation of joint pain and positive ANA.  According to patient in 2013 she developed some hand discomfort and was seen by an orthopedic surgeon who advised braces only.  Patient did not wear braces but the symptoms improved.  She also had some lower back pain one time requiring injection.  In the last year she has been experiencing increased joint pain and swelling.  She states she gets pain and discomfort in her bilateral shoulders, bilateral elbows, bilateral hands, bilateral knee joints and her feet.  Her elbow joints get locked.  She states her left elbow does not open up completely.  Last year she had injection in her left elbow and right knee joint by the orthopedic surgeon and did well for a while.  She states she has noticed swelling in her elbows, hands, knees and her feet.  She believes her feet are still swollen.  She was recently seen by her PCP after acquiring insurance and the lab work showed positive ANA.  She denies any history of oral ulcers, nasal ulcers, malar rash, Raynaud's phenomenon.  She gives history of photosensitivity and lymphadenopathy.  There is positive family history of sarcoidosis in several of her maternal grand aunts. G0P0.  Activities of Daily Living:  Patient reports morning stiffness for 2 hours.   Patient Reports nocturnal pain.  Difficulty dressing/grooming: Reports Difficulty climbing stairs: Reports Difficulty getting out of chair: Reports Difficulty using hands for taps, buttons, cutlery, and/or writing: Reports  Review of Systems  Constitutional:  Positive for fatigue. Negative for night sweats, weight gain and weight loss.  HENT: Negative for mouth sores, trouble swallowing, trouble swallowing, mouth dryness and nose dryness.   Eyes: Positive for dryness. Negative for pain, redness and visual disturbance.  Respiratory: Positive for shortness of breath. Negative for cough and difficulty breathing.        Asthma  Cardiovascular: Negative for chest pain, palpitations, hypertension, irregular heartbeat and swelling in legs/feet.  Gastrointestinal: Negative for blood in stool, constipation and diarrhea.  Endocrine: Positive for heat intolerance. Negative for increased urination.  Genitourinary: Negative for difficulty urinating and vaginal dryness.  Musculoskeletal: Positive for arthralgias, joint pain, joint swelling, muscle weakness, morning stiffness and muscle tenderness. Negative for myalgias and myalgias.  Skin: Positive for sensitivity to sunlight. Negative for color change, rash, hair loss, skin tightness and ulcers.  Allergic/Immunologic: Negative for susceptible to infections.  Neurological: Positive for weakness. Negative for dizziness, numbness, memory loss and night sweats.  Hematological: Positive for swollen glands. Negative for bruising/bleeding tendency.  Psychiatric/Behavioral: Positive for depressed mood and sleep disturbance. The patient is nervous/anxious.     PMFS History:  There are no problems to display for this patient.   Past Medical History:  Diagnosis Date  . Asthma   . Rheumatoid arthritis(714.0)     Family History  Problem Relation Age of Onset  . Hypertension Mother   . Cancer Mother   . Arthritis Father   .  Hypertension Other   . Diabetes Other   . Arthritis Other    Past Surgical History:  Procedure Laterality Date  . WRIST SURGERY     biopsy   Social History   Social History Narrative  . Not on file    There is no immunization history on file for this patient.   Objective: Vital  Signs: BP 127/74 (BP Location: Right Arm, Patient Position: Sitting, Cuff Size: Normal)   Pulse 76   Resp 14   Ht 5' 6.5" (1.689 m)   Wt 232 lb 12.8 oz (105.6 kg)   LMP 08/19/2019   BMI 37.01 kg/m    Physical Exam Vitals and nursing note reviewed.  Constitutional:      Appearance: She is well-developed.  HENT:     Head: Normocephalic and atraumatic.  Eyes:     Conjunctiva/sclera: Conjunctivae normal.  Cardiovascular:     Rate and Rhythm: Normal rate and regular rhythm.     Heart sounds: Normal heart sounds.  Pulmonary:     Effort: Pulmonary effort is normal.     Breath sounds: Normal breath sounds.  Abdominal:     General: Bowel sounds are normal.     Palpations: Abdomen is soft.  Musculoskeletal:     Cervical back: Normal range of motion.  Lymphadenopathy:     Cervical: No cervical adenopathy.  Skin:    General: Skin is warm and dry.     Capillary Refill: Capillary refill takes less than 2 seconds.  Neurological:     Mental Status: She is alert and oriented to person, place, and time.  Psychiatric:        Behavior: Behavior normal.      Musculoskeletal Exam: C-spine was in good range of motion.  She had painful range of motion of bilateral shoulders.  She had contracture in her both elbows with some synovitis in her elbow joints.  Wrist joints with good range of motion.  She has synovitis in some of her MCPs as described below.  Hip joints and knee joints in good range of motion.  She has warmth and swelling in bilateral knee joints.  She has synovitis in some of her MCPs and PIPs as described below.  CDAI Exam: CDAI Score: 16.2  Patient Global: 6 mm; Provider Global: 6 mm Swollen: 8 ; Tender: 15  Joint Exam 09/02/2019      Right  Left  Glenohumeral   Tender   Tender  Elbow  Swollen Tender  Swollen Tender  Wrist   Tender   Tender  PIP 3   Tender   Tender  PIP 4      Tender  Knee  Swollen Tender  Swollen Tender  MTP 2  Swollen Tender     MTP 3  Swollen Tender   Swollen Tender  PIP 3 (toe)     Swollen Tender     Investigation: No additional findings.  Imaging: XR Elbow 2 Views Left  Result Date: 09/02/2019 Narrowing of elbow joint was noted.  No erosive changes were noted. Impression: Elbow joint space narrowing was noted consistent with inflammatory arthritis.  XR Elbow 2 Views Right  Result Date: 09/02/2019 No elbow joint narrowing was noted.  No erosive changes were noted.  No spurring was noted. Impression: Unremarkable x-ray of the elbow joint.  XR Foot 2 Views Left  Result Date: 09/02/2019 Mild PIP and DIP narrowing was noted.  No MTP, intertarsal tibiotalar joint space narrowing was noted.  No erosive changes were  noted. Impression: Mild osteoarthritic changes were noted in the foot.  XR Foot 2 Views Right  Result Date: 09/02/2019 Mild PIP and DIP narrowing was noted.  No MTP, intertarsal tibiotalar joint space narrowing was noted.  No erosive changes were noted. Impression: Mild osteoarthritic changes were noted in the foot.  XR Hand 2 View Left  Result Date: 09/02/2019 No MCP, PIP or DIP narrowing was noted.  No intercarpal or radiocarpal joint space narrowing was noted.  No erosive changes were noted. Impression: Unremarkable x-ray of the hand.  XR Hand 2 View Right  Result Date: 09/02/2019 No MCP, PIP or DIP narrowing was noted.  No intercarpal or radiocarpal joint space narrowing was noted.  No erosive changes were noted. Impression: Unremarkable x-ray of the hand.  XR KNEE 3 VIEW LEFT  Result Date: 09/02/2019 No medial or lateral compartment narrowing was noted.  No patellofemoral narrowing was noted. Impression: Unremarkable x-ray of the knee joint.  XR KNEE 3 VIEW RIGHT  Result Date: 09/02/2019 No medial or lateral compartment narrowing was noted.  No patellofemoral narrowing was noted. Impression: Unremarkable x-ray of the knee joint.   Recent Labs: Lab Results  Component Value Date   HGB 14.6 08/03/2017   NA 143  08/03/2017   K 3.4 (L) 08/03/2017   CL 104 08/03/2017   GLUCOSE 87 08/03/2017   BUN 9 08/03/2017   CREATININE 0.70 08/03/2017  May 15, 2019 CBC normal, CMP normal except low albumin and low calcium, RF 7, CCP 7, ESR 71, uric acid 4.2, Mg 2.0, TSH 1.157, Vit D 19.24, PTH 43, T3 103.58  Speciality Comments: No specialty comments available.  Procedures:  No procedures performed Allergies: Paba derivatives, Penicillins, and Sulfa antibiotics   Assessment / Plan:     Visit Diagnoses: Systemic lupus erythematosus (SLE) in adult [facility]) - 05/15/19: ANA positive, dsDNA 1, RNP>8, smith>8, Ro>8, La-, ESR 71, fatigue, inflammatory arthritis.  Detailed counseling regarding systemic lupus was provided.  Different treatment options and their side effects were discussed.  We discussed initiating Plaquenil.  Indications side effects contraindications were reviewed.  Patient is willing to proceed with Plaquenil.  I will start her on Plaquenil 200 mg p.o. twice daily.  She will need eye examination baseline and annually.  We will check labs in a month and 3 months and then every 5 months in the future.  I will use prednisone as a bridging therapy as she has pain and inflammation in multiple joints is starting at 20 mg p.o. daily and taper by 5 mg every 4 days.  Side effects of prednisone were also discussed.  Use of strict contraception was discussed.  Low estrogen oral contraceptives or IUD was emphasized.  She is using condoms for contraception currently.  Use of sunscreen was also emphasized.  She states that she is allergic to sunscreen.  Association of anti-Ro antibody with rash, arrhythmias and fetal cardiac abnormalities were discussed at length.  Patient states she has had a EKG which was normal.- Plan: Urinalysis, Routine w reflex microscopic, Anti-scleroderma antibody, ANA, C3 and C4, Beta-2 glycoprotein antibodies, Cardiolipin antibodies, IgG, IgM, IgA, Lupus Anticoagulant Eval  w/Reflex  Polyarthralgia-she has pain and discomfort in almost all of her joints.  Pain of both elbows -she has pain and discomfort in her bilateral upper joints.  Synovial thickening was noted.  She has contractures in her both elbows.  Plan: XR Elbow 2 Views Right, XR Elbow 2 Views Left radial chip joint x-ray was unremarkable.  Left elbow  joint x-ray showed joint space narrowing.  Contracture of joint of both elbows  Pain in both hands -she has tenderness over PIPs bilaterally.  No synovitis was noted.  Plan: XR Hand 2 View Right, XR Hand 2 View Left, x-ray of bilateral hands were unremarkable.  14-3-3 eta Protein  Chronic pain of both knees -she has warmth and swelling in her bilateral knee joints.  Plan: XR KNEE 3 VIEW RIGHT, XR KNEE 3 VIEW LEFT.  X-ray of bilateral knee joints were unremarkable.  Pain in both feet -she has tenderness over several of her PIPs and MTPs.  Plan: XR Foot 2 Views Right, XR Foot 2 Views Left.  X-ray of bilateral feet showed mild osteoarthritic changes in the PIPs.  High risk medication use -Plaquenil may not be adequate to control her symptoms.  In anticipation to start her on more aggressive therapy I will obtain additional labs today.  Patient had a chest x-ray in 2019 which was normal.  Plan: Hepatitis B core antibody, IgM, Hepatitis B surface antigen, Hepatitis C antibody, HIV Antibody (routine testing w rflx), QuantiFERON-TB Gold Plus, Serum protein electrophoresis with reflex, IgG, IgA, IgM, Thiopurine methyltransferase(tpmt)rbc, Glucose 6 phosphate dehydrogenase, CBC with Differential/Platelet, COMPLETE METABOLIC PANEL WITH GFR  Other fatigue - Plan: CK  Vitamin D deficiency-it is important to treat vitamin D deficiency aggressively in people with autoimmune disease.  Have placed her on vitamin D 50,000 units twice a week for 3 months.  We will repeat vitamin D level in 3 months.  Other medical problems are listed as follows:  History of  asthma  Seasonal allergies  Former smoker - 1 pack/day for 1 year and quit in 2010.  Orders: Orders Placed This Encounter  Procedures  . XR Hand 2 View Right  . XR Hand 2 View Left  . XR Elbow 2 Views Right  . XR Elbow 2 Views Left  . XR KNEE 3 VIEW RIGHT  . XR KNEE 3 VIEW LEFT  . XR Foot 2 Views Right  . XR Foot 2 Views Left  . Urinalysis, Routine w reflex microscopic  . CK  . 14-3-3 eta Protein  . Anti-scleroderma antibody  . ANA  . C3 and C4  . Beta-2 glycoprotein antibodies  . Cardiolipin antibodies, IgG, IgM, IgA  . Lupus Anticoagulant Eval w/Reflex  . Hepatitis B core antibody, IgM  . Hepatitis B surface antigen  . Hepatitis C antibody  . HIV Antibody (routine testing w rflx)  . QuantiFERON-TB Gold Plus  . Serum protein electrophoresis with reflex  . IgG, IgA, IgM  . Thiopurine methyltransferase(tpmt)rbc  . Glucose 6 phosphate dehydrogenase  . CBC with Differential/Platelet  . COMPLETE METABOLIC PANEL WITH GFR  . CBC with Differential/Platelet  . COMPLETE METABOLIC PANEL WITH GFR  . Ambulatory referral to Allergy   Meds ordered this encounter  Medications  . predniSONE (DELTASONE) 5 MG tablet    Sig: Take 4 tablets (20 mg total) by mouth daily for 4 days, THEN 3 tablets (15 mg total) daily for 4 days, THEN 2 tablets (10 mg total) daily for 4 days, THEN 1 tablet (5 mg total) daily for 4 days, THEN 0.5 tablets (2.5 mg total) daily for 4 days.    Dispense:  42 tablet    Refill:  0  . hydroxychloroquine (PLAQUENIL) 200 MG tablet    Sig: Take 1 tablet (200 mg total) by mouth 2 (two) times daily.    Dispense:  60 tablet    Refill:  2  . Cholecalciferol (VITAMIN D3) 1.25 MG (50000 UT) CAPS    Sig: Take 1 capsule by mouth 2 (two) times a week. For 12 weeks.    Dispense:  24 capsule    Refill:  0    Face-to-face time spent with patient was 60 minutes. Greater than 50% of time was spent in counseling and coordination of care.  Follow-Up Instructions: Return for  Systemic lupus.   Bo Merino, MD  Note - This record has been created using Editor, commissioning.  Chart creation errors have been sought, but may not always  have been located. Such creation errors do not reflect on  the standard of medical care.

## 2019-09-02 ENCOUNTER — Ambulatory Visit: Payer: Self-pay

## 2019-09-02 ENCOUNTER — Encounter: Payer: Self-pay | Admitting: Rheumatology

## 2019-09-02 ENCOUNTER — Other Ambulatory Visit: Payer: Self-pay

## 2019-09-02 ENCOUNTER — Ambulatory Visit: Payer: PRIVATE HEALTH INSURANCE | Admitting: Rheumatology

## 2019-09-02 VITALS — BP 127/74 | HR 76 | Resp 14 | Ht 66.5 in | Wt 232.8 lb

## 2019-09-02 DIAGNOSIS — M25561 Pain in right knee: Secondary | ICD-10-CM

## 2019-09-02 DIAGNOSIS — M24521 Contracture, right elbow: Secondary | ICD-10-CM | POA: Diagnosis not present

## 2019-09-02 DIAGNOSIS — M79641 Pain in right hand: Secondary | ICD-10-CM | POA: Diagnosis not present

## 2019-09-02 DIAGNOSIS — Z889 Allergy status to unspecified drugs, medicaments and biological substances status: Secondary | ICD-10-CM

## 2019-09-02 DIAGNOSIS — E559 Vitamin D deficiency, unspecified: Secondary | ICD-10-CM

## 2019-09-02 DIAGNOSIS — M25521 Pain in right elbow: Secondary | ICD-10-CM

## 2019-09-02 DIAGNOSIS — Z87891 Personal history of nicotine dependence: Secondary | ICD-10-CM

## 2019-09-02 DIAGNOSIS — M79672 Pain in left foot: Secondary | ICD-10-CM | POA: Diagnosis not present

## 2019-09-02 DIAGNOSIS — M329 Systemic lupus erythematosus, unspecified: Secondary | ICD-10-CM | POA: Diagnosis not present

## 2019-09-02 DIAGNOSIS — M79642 Pain in left hand: Secondary | ICD-10-CM

## 2019-09-02 DIAGNOSIS — M2550 Pain in unspecified joint: Secondary | ICD-10-CM

## 2019-09-02 DIAGNOSIS — Z9189 Other specified personal risk factors, not elsewhere classified: Secondary | ICD-10-CM

## 2019-09-02 DIAGNOSIS — M79671 Pain in right foot: Secondary | ICD-10-CM | POA: Diagnosis not present

## 2019-09-02 DIAGNOSIS — M24522 Contracture, left elbow: Secondary | ICD-10-CM

## 2019-09-02 DIAGNOSIS — R5383 Other fatigue: Secondary | ICD-10-CM

## 2019-09-02 DIAGNOSIS — Z79899 Other long term (current) drug therapy: Secondary | ICD-10-CM

## 2019-09-02 DIAGNOSIS — Z8709 Personal history of other diseases of the respiratory system: Secondary | ICD-10-CM

## 2019-09-02 DIAGNOSIS — G8929 Other chronic pain: Secondary | ICD-10-CM

## 2019-09-02 DIAGNOSIS — J302 Other seasonal allergic rhinitis: Secondary | ICD-10-CM

## 2019-09-02 DIAGNOSIS — M25522 Pain in left elbow: Secondary | ICD-10-CM

## 2019-09-02 DIAGNOSIS — M25562 Pain in left knee: Secondary | ICD-10-CM

## 2019-09-02 MED ORDER — HYDROXYCHLOROQUINE SULFATE 200 MG PO TABS: 200.0000 mg | ORAL_TABLET | Freq: Two times a day (BID) | ORAL | 2 refills | Status: DC

## 2019-09-02 MED ORDER — VITAMIN D3 1.25 MG (50000 UT) PO CAPS: 1.0000 | ORAL_CAPSULE | ORAL | 0 refills | Status: DC

## 2019-09-02 MED ORDER — PREDNISONE 5 MG PO TABS: ORAL_TABLET | ORAL | 0 refills | Status: AC

## 2019-09-02 NOTE — Progress Notes (Signed)
Pharmacy Note  Subjective: Patient presents today to [facility] for follow up office visit.   Patient seen by the pharmacist for counseling on hydroxychloroquine autoimmune disease.  She is naive to therapy.  Objective: CMP     Component Value Date/Time   NA 143 08/03/2017 1419   K 3.4 (L) 08/03/2017 1419   CL 104 08/03/2017 1419   GLUCOSE 87 08/03/2017 1419   BUN 9 08/03/2017 1419   CREATININE 0.70 08/03/2017 1419  Repeat in December 2020 stable  CBC    Component Value Date/Time   HGB 14.6 08/03/2017 1419   HCT 43.0 08/03/2017 1419  Repeat in December 2020 stable  Assessment/Plan: Patient was counseled on the purpose, proper use, and adverse effects of hydroxychloroquine including nausea/diarrhea, skin rash, headaches, and sun sensitivity.  Discussed importance of annual eye exams while on hydroxychloroquine to monitor to ocular toxicity and discussed importance of frequent laboratory monitoring.  Provided patient with eye exam form for baseline ophthalmologic exam and standing lab instructions.  Provided patient with educational materials on hydroxychloroquine and answered all questions.  Patient consented to hydroxychloroquine.  Will upload consent in the media tab.     Dose will be Plaquenil 200 mg twice daily based on ABW of 72kg and height of 5'6.5".    Patient also given prednisone taper starting at 20 mg and tapering by 5 mg every 4 days. Patient was counseled on the purpose, proper use, and adverse effects of prednisone including upset stomach, increased appetite, and trouble sleeping. Encouraged patient to take with breakfast to minimize stomach upset and prevent insomnia.  Stressed that prednisone is not to be used for long term management due to adverse effects with chronic use such as osteoporosis, skin thinning, increased risk of infection, and increase blood glucose/blood pressure.Patient verbalized understanding.  Her vitamin D level is low.  Prescription  sent for vitamin D 50,000 units twice a week for 12 weeks.  She will then need to be on mainteance dose of Vitamin D at 2000 units daily. Patient verbalized understanding.  All questions encouraged and answered.  Instructed patient to call with any questions or concerns.   Verlin Fester, PharmD, [place], CPP Clinical Specialty Pharmacist [phone]  09/02/2019 9:24 AM

## 2019-09-02 NOTE — Patient Instructions (Addendum)
START Plaquenil 200 mg 1 tablet twice daily, Vitamin D 50,000 units 1 capsule twice a week, and prednisone taper  Complete baseline Plaquenil toxicity exam within the next month  Standing Labs We placed an order today for your standing lab work.    Please come back and get your standing labs in 1 month, 3 months, then every 5 months.  We have open lab daily Monday through Thursday from 8:30-12:30 PM and 1:30-4:30 PM and Friday from 8:30-12:30 PM and 1:30-4:00 PM at the office of Dr. Pollyann Savoy.   You may experience shorter wait times on Monday and Friday afternoons. The office is located at [address], [address], [place], [place] [address] No appointment is necessary.   Labs are drawn by [facility].  You may receive a bill from [facility] for your lab work.  If you wish to have your labs drawn at another location, please call the office 24 hours in advance to send orders.  If you have any questions regarding directions or hours of operation,  please call [phone].   Just as a reminder please drink plenty of water prior to coming for your lab work. Thanks!  Hydroxychloroquine tablets What is this medicine? HYDROXYCHLOROQUINE (hye drox ee KLOR oh kwin) is used to treat rheumatoid arthritis and systemic lupus erythematosus. It is also used to treat malaria. This medicine may be used for other purposes; ask your health care provider or pharmacist if you have questions. COMMON BRAND NAME(S): Plaquenil, Quineprox What should I tell my health care provider before I take this medicine? They need to know if you have any of these conditions:  diabetes  eye disease, vision problems  G6PD deficiency  heart disease  history of irregular heartbeat  if you often drink alcohol  kidney disease  liver disease  porphyria  psoriasis  an unusual or allergic reaction to chloroquine, hydroxychloroquine, other medicines, foods, dyes, or preservatives  pregnant or trying to get  pregnant  breast-feeding How should I use this medicine? Take this medicine by mouth with a glass of water. Follow the directions on the prescription label. Do not cut, crush or chew this medicine. Swallow the tablets whole. Take this medicine with food. Avoid taking antacids within 4 hours of taking this medicine. It is best to separate these medicines by at least 4 hours. Take your medicine at regular intervals. Do not take it more often than directed. Take all of your medicine as directed even if you think you are better. Do not skip doses or stop your medicine early. Talk to your pediatrician regarding the use of this medicine in children. While this drug may be prescribed for selected conditions, precautions do apply. Overdosage: If you think you have taken too much of this medicine contact a poison control center or emergency room at once. NOTE: This medicine is only for you. Do not share this medicine with others. What if I miss a dose? If you miss a dose, take it as soon as you can. If it is almost time for your next dose, take only that dose. Do not take double or extra doses. What may interact with this medicine? Do not take this medicine with any of the following medications:  cisapride  dronedarone  pimozide  thioridazine This medicine may also interact with the following medications:  ampicillin  antacids  cimetidine  cyclosporine  digoxin  kaolin  medicines for diabetes, like insulin, glipizide, glyburide  medicines for seizures like carbamazepine, phenobarbital, phenytoin  mefloquine  methotrexate  other medicines that prolong the QT interval (cause an abnormal heart rhythm)  praziquantel This list may not describe all possible interactions. Give your health care provider a list of all the medicines, herbs, non-prescription drugs, or dietary supplements you use. Also tell them if you smoke, drink alcohol, or use illegal drugs. Some items may interact with  your medicine. What should I watch for while using this medicine? Visit your health care professional for regular checks on your progress. Tell your health care professional if your symptoms do not start to get better or if they get worse. You may need blood work done while you are taking this medicine. If you take other medicines that can affect heart rhythm, you may need more testing. Talk to your health care professional if you have questions. Your vision may be tested before and during use of this medicine. Tell your health care professional right away if you have any change in your eyesight. What side effects may I notice from receiving this medicine? Side effects that you should report to your doctor or health care professional as soon as possible:  allergic reactions like skin rash, itching or hives, swelling of the face, lips, or tongue  changes in vision  decreased hearing or ringing of the ears  muscle weakness  redness, blistering, peeling or loosening of the skin, including inside the mouth  sensitivity to light  signs and symptoms of a dangerous change in heartbeat or heart rhythm like chest pain; dizziness; fast or irregular heartbeat; palpitations; feeling faint or lightheaded, falls; breathing problems  signs and symptoms of liver injury like dark yellow or brown urine; general ill feeling or flu-like symptoms; light-colored stools; loss of appetite; nausea; right upper belly pain; unusually weak or tired; yellowing of the eyes or skin  signs and symptoms of low blood sugar such as feeling anxious; confusion; dizziness; increased hunger; unusually weak or tired; sweating; shakiness; cold; irritable; headache; blurred vision; fast heartbeat; loss of consciousness  suicidal thoughts  uncontrollable head, mouth, neck, arm, or leg movements Side effects that usually do not require medical attention (report to your doctor or health care professional if they continue or are  bothersome):  diarrhea  dizziness  hair loss  headache  irritable  loss of appetite  nausea, vomiting  stomach pain This list may not describe all possible side effects. Call your doctor for medical advice about side effects. You may report side effects to FDA at 1-800-FDA-1088. Where should I keep my medicine? Keep out of the reach of children. Store at room temperature between 15 and 30 degrees C (59 and 86 degrees F). Protect from moisture and light. Throw away any unused medicine after the expiration date. NOTE: This sheet is a summary. It may not cover all possible information. If you have questions about this medicine, talk to your doctor, pharmacist, or health care provider.  2020 Elsevier/Gold Standard (2018-09-23 12:56:32)

## 2019-09-03 ENCOUNTER — Telehealth: Payer: Self-pay | Admitting: *Deleted

## 2019-09-03 DIAGNOSIS — M329 Systemic lupus erythematosus, unspecified: Secondary | ICD-10-CM

## 2019-09-03 NOTE — Telephone Encounter (Signed)
-----   Message from Pollyann Savoy, MD sent at 09/03/2019  8:07 AM EDT ----- Patient will need a spot urine protein creatinine ratio.  If she prefers to come before her appointment or at the follow-up visit

## 2019-09-03 NOTE — Progress Notes (Signed)
Patient will need a spot urine protein creatinine ratio.  If she prefers to come before her appointment or at the follow-up visit

## 2019-09-05 ENCOUNTER — Telehealth: Payer: Self-pay | Admitting: Rheumatology

## 2019-09-05 ENCOUNTER — Other Ambulatory Visit: Payer: Self-pay | Admitting: *Deleted

## 2019-09-05 DIAGNOSIS — M329 Systemic lupus erythematosus, unspecified: Secondary | ICD-10-CM

## 2019-09-05 NOTE — Telephone Encounter (Signed)
Patient came by office for additional labs. Patient advised from a lupus standpoint it is ok to proceed with dyeing her hair. Ok to continue taking zyrtec.

## 2019-09-05 NOTE — Telephone Encounter (Signed)
I spoke with Dr. Corliss Skains and from a lupus standpoint it is ok to proceed with dyeing her hair. Ok to continue taking zyrtec.

## 2019-09-05 NOTE — Telephone Encounter (Signed)
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 Also, patient wants to know if she is okay  To  take regular allergy medication? Please call to advise.

## 2019-09-06 LAB — PROTEIN / CREATININE RATIO, URINE
Creatinine, Urine: 50 mg/dL (ref 20–275)
Protein/Creat Ratio: 980 mg/g creat — ABNORMAL HIGH (ref 21–161)
Protein/Creatinine Ratio: 0.98 mg/mg creat — ABNORMAL HIGH (ref 0.021–0.16)
Total Protein, Urine: 49 mg/dL — ABNORMAL HIGH (ref 5–24)

## 2019-09-07 NOTE — Progress Notes (Signed)
Protein/ Cr ratio is high. Please, notify patient. Forward results to her PCP and refer to nephrology. Reason for referral - SLE and proteinuria, r/o lupus nephritis.

## 2019-09-07 NOTE — Progress Notes (Signed)
I will discuss results at the fu visit. Pt. was placed on Prednisone and PLQ.

## 2019-09-08 ENCOUNTER — Telehealth: Payer: Self-pay | Admitting: *Deleted

## 2019-09-08 DIAGNOSIS — M329 Systemic lupus erythematosus, unspecified: Secondary | ICD-10-CM

## 2019-09-08 DIAGNOSIS — R808 Other proteinuria: Secondary | ICD-10-CM

## 2019-09-08 NOTE — Telephone Encounter (Signed)
-----   Message from Pollyann Savoy, MD sent at 09/07/2019 10:12 PM EDT ----- Protein/ Cr ratio is high. Please, notify patient. Forward results to her PCP and refer to nephrology. Reason for referral - SLE and proteinuria, r/o lupus nephritis.

## 2019-09-10 LAB — CBC WITH DIFFERENTIAL/PLATELET
Absolute Monocytes: 520 cells/uL (ref 200–950)
Basophils Absolute: 30 cells/uL (ref 0–200)
Basophils Relative: 0.7 %
Eosinophils Absolute: 271 cells/uL (ref 15–500)
Eosinophils Relative: 6.3 %
HCT: 38 % (ref 35.0–45.0)
Hemoglobin: 12.2 g/dL (ref 11.7–15.5)
Lymphs Abs: 1754 cells/uL (ref 850–3900)
MCH: 28.5 pg (ref 27.0–33.0)
MCHC: 32.1 g/dL (ref 32.0–36.0)
MCV: 88.8 fL (ref 80.0–100.0)
MPV: 12 fL (ref 7.5–12.5)
Monocytes Relative: 12.1 %
Neutro Abs: 1724 cells/uL (ref 1500–7800)
Neutrophils Relative %: 40.1 %
Platelets: 271 10*3/uL (ref 140–400)
RBC: 4.28 10*6/uL (ref 3.80–5.10)
RDW: 13.1 % (ref 11.0–15.0)
Total Lymphocyte: 40.8 %
WBC: 4.3 10*3/uL (ref 3.8–10.8)

## 2019-09-10 LAB — BETA-2 GLYCOPROTEIN ANTIBODIES
Beta-2 Glyco 1 IgA: <9 SAU (ref <=20)
Beta-2 Glyco 1 IgM: 9 SMU (ref <=20)
Beta-2 Glyco I IgG: <9 SGU (ref <=20)

## 2019-09-10 LAB — PROTEIN ELECTROPHORESIS, SERUM, WITH REFLEX
Albumin ELP: 3.1 g/dL — ABNORMAL LOW (ref 3.8–4.8)
Alpha 1: 0.3 g/dL (ref 0.2–0.3)
Alpha 2: 0.8 g/dL (ref 0.5–0.9)
Beta 2: 0.4 g/dL (ref 0.2–0.5)
Beta Globulin: 0.4 g/dL (ref 0.4–0.6)
Gamma Globulin: 2.7 g/dL — ABNORMAL HIGH (ref 0.8–1.7)
Total Protein: 7.6 g/dL (ref 6.1–8.1)

## 2019-09-10 LAB — URINALYSIS, ROUTINE W REFLEX MICROSCOPIC
Bacteria, UA: NONE SEEN /HPF
Bilirubin Urine: NEGATIVE
Glucose, UA: NEGATIVE
Hgb urine dipstick: NEGATIVE
Hyaline Cast: NONE SEEN /LPF
Ketones, ur: NEGATIVE
Leukocytes,Ua: NEGATIVE
Nitrite: NEGATIVE
Specific Gravity, Urine: 1.014 (ref 1.001–1.03)
Squamous Epithelial / LPF: NONE SEEN /HPF (ref <=5)
WBC, UA: NONE SEEN /HPF (ref 0–5)
pH: 6.5 (ref 5.0–8.0)

## 2019-09-10 LAB — LUPUS ANTICOAGULANT EVAL W/ REFLEX
PTT-LA Screen: 36 s (ref <=40)
dRVVT: 45 s (ref <=45)

## 2019-09-10 LAB — QUANTIFERON-TB GOLD PLUS
Mitogen-NIL: 6.97 IU/mL
NIL: 0.04 IU/mL
QuantiFERON-TB Gold Plus: NEGATIVE
TB1-NIL: 0 IU/mL
TB2-NIL: <0 IU/mL

## 2019-09-10 LAB — ANTI-NUCLEAR AB-TITER (ANA TITER): ANA Titer 1: 1:1280 {titer} — ABNORMAL HIGH

## 2019-09-10 LAB — COMPLETE METABOLIC PANEL WITH GFR
AG Ratio: 0.6 (calc) — ABNORMAL LOW (ref 1.0–2.5)
ALT: 17 U/L (ref 6–29)
AST: 17 U/L (ref 10–30)
Albumin: 3 g/dL — ABNORMAL LOW (ref 3.6–5.1)
Alkaline phosphatase (APISO): 65 U/L (ref 31–125)
BUN: 10 mg/dL (ref 7–25)
CO2: 22 mmol/L (ref 20–32)
Calcium: 8.2 mg/dL — ABNORMAL LOW (ref 8.6–10.2)
Chloride: 106 mmol/L (ref 98–110)
Creat: 0.65 mg/dL (ref 0.50–1.10)
GFR, Est African American: 134 mL/min/{1.73_m2} (ref >=60)
GFR, Est Non African American: 116 mL/min/{1.73_m2} (ref >=60)
Globulin: 4.7 g/dL (calc) — ABNORMAL HIGH (ref 1.9–3.7)
Glucose, Bld: 86 mg/dL (ref 65–99)
Potassium: 4 mmol/L (ref 3.5–5.3)
Sodium: 136 mmol/L (ref 135–146)
Total Bilirubin: 0.1 mg/dL — ABNORMAL LOW (ref 0.2–1.2)
Total Protein: 7.7 g/dL (ref 6.1–8.1)

## 2019-09-10 LAB — CARDIOLIPIN ANTIBODIES, IGG, IGM, IGA
Anticardiolipin IgA: <11 [APL'U]
Anticardiolipin IgG: <14 [GPL'U]
Anticardiolipin IgM: <12 [MPL'U]

## 2019-09-10 LAB — HEPATITIS C ANTIBODY
Hepatitis C Ab: NONREACTIVE
SIGNAL TO CUT-OFF: 0.04 (ref <1.00)

## 2019-09-10 LAB — ANA: Anti Nuclear Antibody (ANA): POSITIVE — AB

## 2019-09-10 LAB — THIOPURINE METHYLTRANSFERASE (TPMT), RBC: Thiopurine Methyltransferase, RBC: 13 nmol/hr/mL RBC

## 2019-09-10 LAB — HIV ANTIBODY (ROUTINE TESTING W REFLEX): HIV 1&2 Ab, 4th Generation: NONREACTIVE

## 2019-09-10 LAB — C3 AND C4
C3 Complement: 79 mg/dL — ABNORMAL LOW (ref 83–193)
C4 Complement: 18 mg/dL (ref 15–57)

## 2019-09-10 LAB — HEPATITIS B SURFACE ANTIGEN: Hepatitis B Surface Ag: NONREACTIVE

## 2019-09-10 LAB — GLUCOSE 6 PHOSPHATE DEHYDROGENASE: G-6PDH: 11.6 U/g Hgb (ref 7.0–20.5)

## 2019-09-10 LAB — ANTI-SCLERODERMA ANTIBODY: Scleroderma (Scl-70) (ENA) Antibody, IgG: <1 AI

## 2019-09-10 LAB — 14-3-3 ETA PROTEIN: 14-3-3 eta Protein: <0.2 ng/mL (ref <0.2)

## 2019-09-10 LAB — IGG, IGA, IGM
IgG (Immunoglobin G), Serum: 3105 mg/dL — ABNORMAL HIGH (ref 600–1640)
IgM, Serum: 248 mg/dL (ref 50–300)
Immunoglobulin A: 401 mg/dL — ABNORMAL HIGH (ref 47–310)

## 2019-09-10 LAB — HEPATITIS B CORE ANTIBODY, IGM: Hep B C IgM: NONREACTIVE

## 2019-09-10 LAB — CK: Total CK: 96 U/L (ref 29–143)

## 2019-09-22 NOTE — Progress Notes (Addendum)
Office Visit Note  Patient: Kristin Murphy             Date of Birth: 04/13/1986           MRN: [id number]             PCP: Patient, No Pcp Per Referring: No ref. provider found Visit Date: 10/01/2019 Occupation: @GUAROCC @  Subjective:  Medication management   History of Present Illness: Kristin Murphy is a 34 y.o. female with history of systemic lupus dermatosis and proteinuria.  She was placed on Plaquenil and prednisone taper at the last visit.  She has finished her prednisone taper.  She has been taking Plaquenil on a regular basis.  She has noticed improvement in her overall symptoms.  She states she was seen by nephrologist and is a scheduled to have renal biopsy on Oct 07, 2019.  Activities of Daily Living:  Patient reports morning stiffness for 0 minutes.   Patient Reports nocturnal pain.  Difficulty dressing/grooming: Denies Difficulty climbing stairs: Denies Difficulty getting out of chair: Denies Difficulty using hands for taps, buttons, cutlery, and/or writing: Reports  Review of Systems  Constitutional: Negative for fatigue, night sweats, weight gain and weight loss.  HENT: Negative for mouth sores, trouble swallowing, trouble swallowing, mouth dryness and nose dryness.   Eyes: Negative for pain, redness, itching, visual disturbance and dryness.  Respiratory: Negative for cough, shortness of breath and difficulty breathing.   Cardiovascular: Negative for chest pain, palpitations, hypertension, irregular heartbeat and swelling in legs/feet.  Gastrointestinal: Positive for diarrhea. Negative for abdominal pain, blood in stool and constipation.  Endocrine: Negative for increased urination.  Genitourinary: Negative for difficulty urinating and vaginal dryness.  Musculoskeletal: Positive for arthralgias and joint pain. Negative for joint swelling, myalgias, muscle weakness, morning stiffness, muscle tenderness and myalgias.  Skin: Negative for color change, rash,  hair loss, redness, skin tightness, ulcers and sensitivity to sunlight.  Allergic/Immunologic: Negative for susceptible to infections.  Neurological: Positive for headaches. Negative for dizziness, numbness, memory loss, night sweats and weakness.       Related to hunger  Hematological: Negative for bruising/bleeding tendency and swollen glands.  Psychiatric/Behavioral: Negative for depressed mood, confusion and sleep disturbance. The patient is not nervous/anxious.     PMFS History:  There are no problems to display for this patient.   Past Medical History:  Diagnosis Date   Asthma    Rheumatoid arthritis(714.0)     Family History  Problem Relation Age of Onset   Hypertension Mother    Cancer Mother    Arthritis Father    Hypertension Other    Diabetes Other    Arthritis Other    Sarcoidosis Paternal Grandmother    Sarcoidosis Other    Past Surgical History:  Procedure Laterality Date   WRIST SURGERY     biopsy   Social History   Social History Narrative   Not on file    There is no immunization history on file for this patient.   Objective: Vital Signs: BP 110/73 (BP Location: Left Arm, Patient Position: Sitting, Cuff Size: Normal)    Pulse 85    Resp 15    Ht 5' 6.5" (1.689 m)    Wt 230 lb 3.2 oz (104.4 kg)    BMI 36.60 kg/m    Physical Exam Vitals and nursing note reviewed.  Constitutional:      Appearance: She is well-developed.  HENT:     Head: Normocephalic and atraumatic.  Eyes:  Conjunctiva/sclera: Conjunctivae normal.  Cardiovascular:     Rate and Rhythm: Normal rate and regular rhythm.     Heart sounds: Normal heart sounds.  Pulmonary:     Effort: Pulmonary effort is normal.     Breath sounds: Normal breath sounds.  Abdominal:     General: Bowel sounds are normal.     Palpations: Abdomen is soft.  Musculoskeletal:     Cervical back: Normal range of motion.  Lymphadenopathy:     Cervical: No cervical adenopathy.  Skin:     General: Skin is warm and dry.     Capillary Refill: Capillary refill takes less than 2 seconds.  Neurological:     Mental Status: She is alert and oriented to person, place, and time.  Psychiatric:        Behavior: Behavior normal.      Musculoskeletal Exam: C-spine thoracic and lumbar spine with good range of motion.  Shoulder joints,  wrist joints, MCPs PIPs DIPs with good range of motion with no synovitis.  She has left elbow joint contracture and synovial thickening.  She has some warmth and palpation of her right knee joint.  Ankle joints MTPs PIPs with good range of motion with no synovitis.  CDAI Exam: CDAI Score: -- Patient Global: --; Provider Global: -- Swollen: --; Tender: -- Joint Exam 10/01/2019   No joint exam has been documented for this visit   There is currently no information documented on the homunculus. Go to the Rheumatology activity and complete the homunculus joint exam.  Investigation: No additional findings.  Imaging: XR Elbow 2 Views Left  Result Date: 09/02/2019 Narrowing of elbow joint was noted.  No erosive changes were noted. Impression: Elbow joint space narrowing was noted consistent with inflammatory arthritis.  XR Elbow 2 Views Right  Result Date: 09/02/2019 No elbow joint narrowing was noted.  No erosive changes were noted.  No spurring was noted. Impression: Unremarkable x-ray of the elbow joint.  XR Foot 2 Views Left  Result Date: 09/02/2019 Mild PIP and DIP narrowing was noted.  No MTP, intertarsal tibiotalar joint space narrowing was noted.  No erosive changes were noted. Impression: Mild osteoarthritic changes were noted in the foot.  XR Foot 2 Views Right  Result Date: 09/02/2019 Mild PIP and DIP narrowing was noted.  No MTP, intertarsal tibiotalar joint space narrowing was noted.  No erosive changes were noted. Impression: Mild osteoarthritic changes were noted in the foot.  XR Hand 2 View Left  Result Date: 09/02/2019 No MCP, PIP or  DIP narrowing was noted.  No intercarpal or radiocarpal joint space narrowing was noted.  No erosive changes were noted. Impression: Unremarkable x-ray of the hand.  XR Hand 2 View Right  Result Date: 09/02/2019 No MCP, PIP or DIP narrowing was noted.  No intercarpal or radiocarpal joint space narrowing was noted.  No erosive changes were noted. Impression: Unremarkable x-ray of the hand.  XR KNEE 3 VIEW LEFT  Result Date: 09/02/2019 No medial or lateral compartment narrowing was noted.  No patellofemoral narrowing was noted. Impression: Unremarkable x-ray of the knee joint.  XR KNEE 3 VIEW RIGHT  Result Date: 09/02/2019 No medial or lateral compartment narrowing was noted.  No patellofemoral narrowing was noted. Impression: Unremarkable x-ray of the knee joint.   Recent Labs: Lab Results  Component Value Date   WBC 4.3 09/02/2019   HGB 12.2 09/02/2019   PLT 271 09/02/2019   NA 136 09/02/2019   K 4.0 09/02/2019  CL 106 09/02/2019   CO2 22 09/02/2019   GLUCOSE 86 09/02/2019   BUN 10 09/02/2019   CREATININE 0.65 09/02/2019   BILITOT 0.1 (L) 09/02/2019   AST 17 09/02/2019   ALT 17 09/02/2019   PROT 7.6 09/02/2019   PROT 7.7 09/02/2019   CALCIUM 8.2 (L) 09/02/2019   GFRAA 134 09/02/2019   QFTBGOLDPLUS NEGATIVE 09/02/2019  September 02, 2019 UA showed 1+ protein, urine protein creatinine ratio 0.98, SPEP consistent with inflammatory disease, TB Gold negative, IgG elevated at  3105, hepatitis B-, hepatitis C negative, HIV negative, G6PD normal, TP MT normal, CK 96 Lupus anticoagulant negative, C3 79 low, ANA 1: 1280 speckled, 14 3 3  eta negative  May 15, 2019 CBC normal, CMP normal except low albumin and low calcium, RF 7, CCP 7, ESR 71, uric acid 4.2, Mg 2.0, TSH 1.157, Vit D 19.24, PTH 43, T3 103.58, 05/15/19: ANA positive, dsDNA 1, RNP>8, smith>8, Ro>8, La-, ESR 71    Speciality Comments: No specialty comments available.  Procedures:  No procedures performed Allergies:  Amoxicillin, Paba derivatives, Penicillins, and Sulfa antibiotics   Assessment / Plan:     Visit Diagnoses: Systemic lupus erythematosus (SLE) in adult ([place]) - Positive ANA, positive Ro, positive Sm, positive RNP, proteinuria, inflammatory arthritis.  Patient finished prednisone taper.  She is on Plaquenil for a month now and has been feeling better.  She continues to have some discomfort in her left elbow where she has contracture.  She has some warmth in her right knee joint.  My plan is to start her on Imuran after her renal biopsy.  She has difficulty walking at times to her apartment.  As per patient's request a note was given to her apartment manager for parking.  Other proteinuria-patient was evaluated by [facility].  Patient states she is scheduled for renal biopsy.  High risk medication use - Plan: CBC with Differential/Platelet, COMPLETE METABOLIC PANEL WITH GFR today.  Contracture of joint of both elbows-right elbow joint contracture has improved although she still have significant contracture in her left elbow joint.  Other fatigue-improved.  Vitamin D deficiency-she is on vitamin D supplement.  History of asthma  Seasonal allergies  Former smoker  H/O multiple allergies  Orders: Orders Placed This Encounter  Procedures   CBC with Differential/Platelet   COMPLETE METABOLIC PANEL WITH GFR   No orders of the defined types were placed in this encounter.   Face-to-face time spent with patient was 30 minutes. Greater than 50% of time was spent in counseling and coordination of care.  Follow-Up Instructions: Return in about 6 weeks (around 11/12/2019) for Systemic lupus.   Bo Merino, MD  Note - This record has been created using Editor, commissioning.  Chart creation errors have been sought, but may not always  have been located. Such creation errors do not reflect on  the standard of medical care.

## 2019-09-26 ENCOUNTER — Other Ambulatory Visit (HOSPITAL_COMMUNITY): Payer: Self-pay | Admitting: Nephrology

## 2019-09-26 DIAGNOSIS — R809 Proteinuria, unspecified: Secondary | ICD-10-CM

## 2019-09-26 DIAGNOSIS — R319 Hematuria, unspecified: Secondary | ICD-10-CM

## 2019-10-01 ENCOUNTER — Other Ambulatory Visit: Payer: Self-pay

## 2019-10-01 ENCOUNTER — Ambulatory Visit (INDEPENDENT_AMBULATORY_CARE_PROVIDER_SITE_OTHER): Payer: PRIVATE HEALTH INSURANCE | Admitting: Rheumatology

## 2019-10-01 ENCOUNTER — Encounter: Payer: Self-pay | Admitting: Rheumatology

## 2019-10-01 VITALS — BP 110/73 | HR 85 | Resp 15 | Ht 66.5 in | Wt 230.2 lb

## 2019-10-01 DIAGNOSIS — R808 Other proteinuria: Secondary | ICD-10-CM | POA: Diagnosis not present

## 2019-10-01 DIAGNOSIS — E559 Vitamin D deficiency, unspecified: Secondary | ICD-10-CM

## 2019-10-01 DIAGNOSIS — M24522 Contracture, left elbow: Secondary | ICD-10-CM

## 2019-10-01 DIAGNOSIS — M2550 Pain in unspecified joint: Secondary | ICD-10-CM

## 2019-10-01 DIAGNOSIS — M24521 Contracture, right elbow: Secondary | ICD-10-CM | POA: Diagnosis not present

## 2019-10-01 DIAGNOSIS — Z79899 Other long term (current) drug therapy: Secondary | ICD-10-CM | POA: Diagnosis not present

## 2019-10-01 DIAGNOSIS — M329 Systemic lupus erythematosus, unspecified: Secondary | ICD-10-CM

## 2019-10-01 DIAGNOSIS — Z8709 Personal history of other diseases of the respiratory system: Secondary | ICD-10-CM

## 2019-10-01 DIAGNOSIS — Z889 Allergy status to unspecified drugs, medicaments and biological substances status: Secondary | ICD-10-CM

## 2019-10-01 DIAGNOSIS — R5383 Other fatigue: Secondary | ICD-10-CM

## 2019-10-01 DIAGNOSIS — Z87891 Personal history of nicotine dependence: Secondary | ICD-10-CM

## 2019-10-01 DIAGNOSIS — Z9189 Other specified personal risk factors, not elsewhere classified: Secondary | ICD-10-CM

## 2019-10-01 DIAGNOSIS — J302 Other seasonal allergic rhinitis: Secondary | ICD-10-CM

## 2019-10-02 ENCOUNTER — Ambulatory Visit: Payer: Self-pay | Admitting: Allergy

## 2019-10-02 LAB — CBC WITH DIFFERENTIAL/PLATELET
Absolute Monocytes: 562 cells/uL (ref 200–950)
Basophils Absolute: 30 cells/uL (ref 0–200)
Basophils Relative: 0.8 %
Eosinophils Absolute: 391 cells/uL (ref 15–500)
Eosinophils Relative: 10.3 %
HCT: 38.4 % (ref 35.0–45.0)
Hemoglobin: 12.5 g/dL (ref 11.7–15.5)
Lymphs Abs: 2025 cells/uL (ref 850–3900)
MCH: 28.7 pg (ref 27.0–33.0)
MCHC: 32.6 g/dL (ref 32.0–36.0)
MCV: 88.3 fL (ref 80.0–100.0)
MPV: 11.8 fL (ref 7.5–12.5)
Monocytes Relative: 14.8 %
Neutro Abs: 790 cells/uL — ABNORMAL LOW (ref 1500–7800)
Neutrophils Relative %: 20.8 %
Platelets: 279 10*3/uL (ref 140–400)
RBC: 4.35 10*6/uL (ref 3.80–5.10)
RDW: 13 % (ref 11.0–15.0)
Total Lymphocyte: 53.3 %
WBC: 3.8 10*3/uL (ref 3.8–10.8)

## 2019-10-02 LAB — COMPLETE METABOLIC PANEL WITH GFR
AG Ratio: 0.7 (calc) — ABNORMAL LOW (ref 1.0–2.5)
ALT: 15 U/L (ref 6–29)
AST: 15 U/L (ref 10–30)
Albumin: 3.3 g/dL — ABNORMAL LOW (ref 3.6–5.1)
Alkaline phosphatase (APISO): 59 U/L (ref 31–125)
BUN: 7 mg/dL (ref 7–25)
CO2: 23 mmol/L (ref 20–32)
Calcium: 8.9 mg/dL (ref 8.6–10.2)
Chloride: 105 mmol/L (ref 98–110)
Creat: 0.75 mg/dL (ref 0.50–1.10)
GFR, Est African American: 121 mL/min/{1.73_m2} (ref >=60)
GFR, Est Non African American: 104 mL/min/{1.73_m2} (ref >=60)
Globulin: 4.8 g/dL (calc) — ABNORMAL HIGH (ref 1.9–3.7)
Glucose, Bld: 77 mg/dL (ref 65–99)
Potassium: 4 mmol/L (ref 3.5–5.3)
Sodium: 135 mmol/L (ref 135–146)
Total Bilirubin: 0.3 mg/dL (ref 0.2–1.2)
Total Protein: 8.1 g/dL (ref 6.1–8.1)

## 2019-10-02 NOTE — Progress Notes (Signed)
Decrease  in WBCs and absolute neutrophils was noted.  It is most likely related to the Plaquenil use.  No change in therapy needed.

## 2019-10-05 ENCOUNTER — Other Ambulatory Visit: Payer: Self-pay | Admitting: Radiology

## 2019-10-06 ENCOUNTER — Telehealth: Payer: Self-pay | Admitting: Rheumatology

## 2019-10-06 NOTE — Telephone Encounter (Signed)
At appointment on 10/01/2019, patient requested a letter for special parking at her apartment complex. Patient is now requesting a handicap placard. Okay to provide handicap placard? Temporary or permanent? Please advise.

## 2019-10-06 NOTE — Telephone Encounter (Signed)
Temporary placard has been signed by Dr. Corliss Skains and is at the front desk ready to be picked up. Advised patient and patient verbalized understanding.

## 2019-10-06 NOTE — Telephone Encounter (Signed)
ok 

## 2019-10-06 NOTE — Telephone Encounter (Signed)
Patient called stating Dr. Corliss Skains wrote her a letter for her apartment complex for special parking access.  Patient states her apartment complex needs her to get a handicap placard.  Patient is requesting a return call when the form is ready to be picked up.

## 2019-10-06 NOTE — Telephone Encounter (Signed)
Okay to give temporary handicap placard.

## 2019-10-07 ENCOUNTER — Ambulatory Visit (HOSPITAL_COMMUNITY): Payer: PRIVATE HEALTH INSURANCE

## 2019-10-14 ENCOUNTER — Telehealth: Payer: Self-pay | Admitting: Rheumatology

## 2019-10-14 NOTE — Telephone Encounter (Signed)
Patient called stating she is due to take her injection today, but is experiencing sore throat and upset stomach.  Patient is requesting a return call.

## 2019-10-14 NOTE — Telephone Encounter (Signed)
Patient states she woke up at 3 am this morning and has been vomitting and has had diarrhea. Patient states she has had a death in the family so she is unsure if the symptoms are due to being upset or if she has a stomach bug. Patient did not take plaquenil today due to not being able to keep anything down. Patient has been drinking ginger ale and Gatorade. She will try to take plaquenil tonight or in the morning if she can keep liquids/food down. Patient will contact her PCP about  Symptoms she is having.

## 2019-10-20 ENCOUNTER — Other Ambulatory Visit: Payer: Self-pay | Admitting: Student

## 2019-10-21 ENCOUNTER — Ambulatory Visit (HOSPITAL_COMMUNITY)
Admission: RE | Admit: 2019-10-21 | Discharge: 2019-10-21 | Disposition: A | Discharge status: Home / Self Care | Payer: PRIVATE HEALTH INSURANCE | Source: Ambulatory Visit | Attending: Nephrology | Admitting: Nephrology

## 2019-10-21 ENCOUNTER — Other Ambulatory Visit: Payer: Self-pay

## 2019-10-21 ENCOUNTER — Encounter (HOSPITAL_COMMUNITY): Payer: Self-pay

## 2019-10-21 DIAGNOSIS — R809 Proteinuria, unspecified: Secondary | ICD-10-CM | POA: Diagnosis present

## 2019-10-21 DIAGNOSIS — M329 Systemic lupus erythematosus, unspecified: Secondary | ICD-10-CM | POA: Diagnosis not present

## 2019-10-21 DIAGNOSIS — R319 Hematuria, unspecified: Secondary | ICD-10-CM

## 2019-10-21 LAB — CBC WITH DIFFERENTIAL/PLATELET
Abs Immature Granulocytes: 0.01 10*3/uL (ref 0.00–0.07)
Basophils Absolute: 0 10*3/uL (ref 0.0–0.1)
Basophils Relative: 1 %
Eosinophils Absolute: 0.4 10*3/uL (ref 0.0–0.5)
Eosinophils Relative: 7 %
HCT: 31.8 % — ABNORMAL LOW (ref 36.0–46.0)
Hemoglobin: 10.3 g/dL — ABNORMAL LOW (ref 12.0–15.0)
Immature Granulocytes: 0 %
Lymphocytes Relative: 48 %
Lymphs Abs: 2.3 10*3/uL (ref 0.7–4.0)
MCH: 28.7 pg (ref 26.0–34.0)
MCHC: 32.4 g/dL (ref 30.0–36.0)
MCV: 88.6 fL (ref 80.0–100.0)
Monocytes Absolute: 0.6 10*3/uL (ref 0.1–1.0)
Monocytes Relative: 12 %
Neutro Abs: 1.5 10*3/uL — ABNORMAL LOW (ref 1.7–7.7)
Neutrophils Relative %: 32 %
Platelets: 226 10*3/uL (ref 150–400)
RBC: 3.59 MIL/uL — ABNORMAL LOW (ref 3.87–5.11)
RDW: 12.7 % (ref 11.5–15.5)
WBC: 4.7 10*3/uL (ref 4.0–10.5)
nRBC: 0 % (ref 0.0–0.2)

## 2019-10-21 LAB — BASIC METABOLIC PANEL
Anion gap: 8 (ref 5–15)
BUN: 6 mg/dL (ref 6–20)
CO2: 24 mmol/L (ref 22–32)
Calcium: 8.4 mg/dL — ABNORMAL LOW (ref 8.9–10.3)
Chloride: 106 mmol/L (ref 98–111)
Creatinine, Ser: 0.68 mg/dL (ref 0.44–1.00)
GFR calc Af Amer: >60 mL/min (ref >60)
GFR calc non Af Amer: >60 mL/min (ref >60)
Glucose, Bld: 90 mg/dL (ref 70–99)
Potassium: 3.4 mmol/L — ABNORMAL LOW (ref 3.5–5.1)
Sodium: 138 mmol/L (ref 135–145)

## 2019-10-21 LAB — PROTIME-INR
INR: 1.1 (ref 0.8–1.2)
Prothrombin Time: 13.8 seconds (ref 11.4–15.2)

## 2019-10-21 LAB — PREGNANCY, URINE: Preg Test, Ur: NEGATIVE

## 2019-10-21 MED ORDER — FENTANYL CITRATE (PF) 100 MCG/2ML IJ SOLN
INTRAMUSCULAR | Status: AC | PRN
  Administered 2019-10-21: 25 ug via INTRAVENOUS
  Administered 2019-10-21: 50 ug via INTRAVENOUS

## 2019-10-21 MED ORDER — FENTANYL CITRATE (PF) 100 MCG/2ML IJ SOLN
INTRAMUSCULAR | Status: AC
  Filled 2019-10-21: qty 2

## 2019-10-21 MED ORDER — MIDAZOLAM HCL 2 MG/2ML IJ SOLN
INTRAMUSCULAR | Status: AC | PRN
  Administered 2019-10-21: 0.5 mg via INTRAVENOUS
  Administered 2019-10-21: 1 mg via INTRAVENOUS

## 2019-10-21 MED ORDER — SODIUM CHLORIDE 0.9 % IV SOLN: INTRAVENOUS | Status: DC

## 2019-10-21 MED ORDER — MIDAZOLAM HCL 2 MG/2ML IJ SOLN
INTRAMUSCULAR | Status: AC
  Filled 2019-10-21: qty 2

## 2019-10-21 MED ORDER — LIDOCAINE-EPINEPHRINE 1 %-1:100000 IJ SOLN
INTRAMUSCULAR | Status: AC
  Filled 2019-10-21: qty 1

## 2019-10-21 MED ORDER — GELATIN ABSORBABLE 12-7 MM EX MISC
CUTANEOUS | Status: AC
  Filled 2019-10-21: qty 1

## 2019-10-21 NOTE — Discharge Instructions (Addendum)
Percutaneous Kidney Biopsy, Care After This sheet gives you information about how to care for yourself after your procedure. Your health care provider may also give you more specific instructions. If you have problems or questions, contact your health care provider. What can I expect after the procedure? After the procedure, it is common to have:  Pain or soreness near the biopsy site.  Pink or cloudy urine for 24 hours after the procedure. Follow these instructions at home: Activity  Return to your normal activities as told by your health care provider. Ask your health care provider what activities are safe for you.  If you were given a sedative during the procedure, it can affect you for several hours. Do not drive or operate machinery until your health care provider says that it is safe.  Do not lift anything that is heavier than 10 lb (4.5 kg), or the limit that you are told, until your health care provider says that it is safe.  Avoid activities that take a lot of effort (are strenuous) until your health care provider approves. Most people will have to wait 2 weeks before returning to activities such as exercise or sex. General instructions   Take over-the-counter and prescription medicines only as told by your health care provider.  You may eat and drink after your procedure. Follow instructions from your health care provider about eating or drinking restrictions.  Check your biopsy site every day for signs of infection. Check for: ? More redness, swelling, or pain. ? Fluid or blood. ? Warmth. ? Pus or a bad smell.  Keep all follow-up visits as told by your health care provider. This is important. Contact a health care provider if:  You have more redness, swelling, or pain around your biopsy site.  You have fluid or blood coming from your biopsy site.  Your biopsy site feels warm to the touch.  You have pus or a bad smell coming from your biopsy site.  You have blood  in your urine more than 24 hours after your procedure.  You have a fever. Get help right away if:  Your urine is dark red or brown.  You cannot urinate.  It burns when you urinate.  You feel dizzy or light-headed.  You have severe pain in your abdomen or side. Summary  After the procedure, it is common to have pain or soreness at the biopsy site and pink or cloudy urine for the first 24 hours.  Check your biopsy site each day for signs of infection, such as more redness, swelling, or pain; fluid, blood, pus or a bad smell coming from the biopsy site; or the biopsy site feeling warm to touch.  Return to your normal activities as told by your health care provider. This information is not intended to replace advice given to you by your health care provider. Make sure you discuss any questions you have with your health care provider. Document Revised: 01/16/2019 Document Reviewed: 01/16/2019 Elsevier Patient Education  2020 Elsevier Inc. Moderate Conscious Sedation, Adult Sedation is the use of medicines to promote relaxation and relieve discomfort and anxiety. Moderate conscious sedation is a type of sedation. Under moderate conscious sedation, you are less alert than normal, but you are still able to respond to instructions, touch, or both. Moderate conscious sedation is used during short medical and dental procedures. It is milder than deep sedation, which is a type of sedation under which you cannot be easily woken up. It is also milder than   general anesthesia, which is the use of medicines to make you unconscious. Moderate conscious sedation allows you to return to your regular activities sooner. Tell a health care provider about:  Any allergies you have.  All medicines you are taking, including vitamins, herbs, eye drops, creams, and over-the-counter medicines.  Use of steroids (by mouth or creams).  Any problems you or family members have had with sedatives and anesthetic  medicines.  Any blood disorders you have.  Any surgeries you have had.  Any medical conditions you have, such as sleep apnea.  Whether you are pregnant or may be pregnant.  Any use of cigarettes, alcohol, marijuana, or street drugs. What are the risks? Generally, this is a safe procedure. However, problems may occur, including:  Getting too much medicine (oversedation).  Nausea.  Allergic reaction to medicines.  Trouble breathing. If this happens, a breathing tube may be used to help with breathing. It will be removed when you are awake and breathing on your own.  Heart trouble.  Lung trouble. What happens before the procedure? Staying hydrated Follow instructions from your health care provider about hydration, which may include:  Up to 2 hours before the procedure - you may continue to drink clear liquids, such as water, clear fruit juice, black coffee, and plain tea. Eating and drinking restrictions Follow instructions from your health care provider about eating and drinking, which may include:  8 hours before the procedure - stop eating heavy meals or foods such as meat, fried foods, or fatty foods.  6 hours before the procedure - stop eating light meals or foods, such as toast or cereal.  6 hours before the procedure - stop drinking milk or drinks that contain milk.  2 hours before the procedure - stop drinking clear liquids. Medicine Ask your health care provider about:  Changing or stopping your regular medicines. This is especially important if you are taking diabetes medicines or blood thinners.  Taking medicines such as aspirin and ibuprofen. These medicines can thin your blood. Do not take these medicines before your procedure if your health care provider instructs you not to.  Tests and exams  You will have a physical exam.  You may have blood tests done to show: ? How well your kidneys and liver are working. ? How well your blood can clot. General  instructions  Plan to have someone take you home from the hospital or clinic.  If you will be going home right after the procedure, plan to have someone with you for 24 hours. What happens during the procedure?  An IV tube will be inserted into one of your veins.  Medicine to help you relax (sedative) will be given through the IV tube.  The medical or dental procedure will be performed. What happens after the procedure?  Your blood pressure, heart rate, breathing rate, and blood oxygen level will be monitored often until the medicines you were given have worn off.  Do not drive for 24 hours. This information is not intended to replace advice given to you by your health care provider. Make sure you discuss any questions you have with your health care provider. Document Revised: 04/27/2017 Document Reviewed: 09/04/2015 Elsevier Patient Education  2020 Elsevier Inc.  

## 2019-10-21 NOTE — Procedures (Signed)
Pre Procedure Dx: Systemic lupus erythematosus dermatosis and proteinuria Post Procedural Dx: Same  Technically successful US guided biopsy of inferior pole of the left kidney.   EBL: None  No immediate complications.   Katherina Right, MD Pager #: [phone]

## 2019-10-21 NOTE — Consult Note (Addendum)
Chief Complaint: proteinuria  Referring Physician(s): Dr. Britt Bottom  Supervising Physician: Simonne Come  Patient Status: [facility] - Out-pt  History of Present Illness: Kristin Murphy is a 34 y.o. female History of systemic lupus erythematosus dermatosis and proteinuria. Team is requesting kidney biopsy for further evaluation of her proteinura .   Past Medical History:  Diagnosis Date  . Asthma   . Rheumatoid arthritis(714.0)     Past Surgical History:  Procedure Laterality Date  . WRIST SURGERY     biopsy    Allergies: Amoxicillin, Paba derivatives, Penicillins, and Sulfa antibiotics  Medications: Prior to Admission medications   Medication Sig Start Date End Date Taking? Authorizing Provider  cetirizine (ZYRTEC) 10 MG tablet Take 10 mg by mouth daily.   Yes [provider]  Cholecalciferol (VITAMIN D3) 1.25 MG (50000 UT) CAPS Take 1 capsule by mouth 2 (two) times a week. For 12 weeks. Patient taking differently: Take 50,000 Units by mouth 2 (two) times a week. For 12 weeks. 09/04/19  Yes Deveshwar, Janalyn Rouse, MD  hydroxychloroquine (PLAQUENIL) 200 MG tablet Take 1 tablet (200 mg total) by mouth 2 (two) times daily. 09/02/19  Yes Deveshwar, Janalyn Rouse, MD  ibuprofen (ADVIL,MOTRIN) 200 MG tablet Take 800 mg by mouth every 6 (six) hours as needed for moderate pain. Liquid cap    [provider]     Family History  Problem Relation Age of Onset  . Hypertension Mother   . Cancer Mother   . Arthritis Father   . Hypertension Other   . Diabetes Other   . Arthritis Other   . Sarcoidosis Paternal Grandmother   . Sarcoidosis Other     Social History   Socioeconomic History  . Marital status: Single    Spouse name: Not on file  . Number of children: Not on file  . Years of education: Not on file  . Highest education level: Not on file  Occupational History  . Not on file  Tobacco Use  . Smoking status: Former Smoker    Types: Cigarettes  . Smokeless  tobacco: Never Used  . Tobacco comment: college  Substance and Sexual Activity  . Alcohol use: Yes    Comment: rarely  . Drug use: No    Types: Marijuana  . Sexual activity: Not on file  Other Topics Concern  . Not on file  Social History Narrative  . Not on file   Social Determinants of Health   Financial Resource Strain:   . Difficulty of Paying Living Expenses:   Food Insecurity:   . Worried About Programme researcher, broadcasting/film/video in the Last Year:   . Barista in the Last Year:   Transportation Needs:   . Freight forwarder (Medical):   Marland Kitchen Lack of Transportation (Non-Medical):   Physical Activity:   . Days of Exercise per Week:   . Minutes of Exercise per Session:   Stress:   . Feeling of Stress :   Social Connections:   . Frequency of Communication with Friends and Family:   . Frequency of Social Gatherings with Friends and Family:   . Attends Religious Services:   . Active Member of Clubs or Organizations:   . Attends Banker Meetings:   Marland Kitchen Marital Status:       Review of Systems: A 12 point ROS discussed and pertinent positives are indicated in the HPI above.  All other systems are negative.  Review of Systems  Constitutional: Negative for  fatigue and fever.  HENT: Negative for congestion.   Respiratory: Negative for cough and shortness of breath.   Gastrointestinal: Negative for abdominal pain, diarrhea, nausea and vomiting.  Musculoskeletal: Positive for arthralgias.    Vital Signs: BP 115/72   Pulse (!) 57   Temp 98.4 F (36.9 C) (Oral)   Resp 16   Ht 5' 6.5" (1.689 m)   Wt 238 lb (108 kg)   LMP 10/07/2019   SpO2 100%   BMI 37.84 kg/m   Physical Exam Vitals and nursing note reviewed.  Constitutional:      Appearance: She is well-developed.  HENT:     Head: Normocephalic and atraumatic.  Eyes:     Conjunctiva/sclera: Conjunctivae normal.  Cardiovascular:     Rate and Rhythm: Normal rate and regular rhythm.     Heart sounds:  Normal heart sounds.  Pulmonary:     Effort: Pulmonary effort is normal.     Breath sounds: Normal breath sounds.  Musculoskeletal:     Cervical back: Normal range of motion.  Neurological:     Mental Status: She is alert and oriented to person, place, and time.     Imaging: No results found.  Labs:  CBC: Recent Labs    09/02/19 0918 10/01/19 1557 10/21/19 0610  WBC 4.3 3.8 4.7  HGB 12.2 12.5 10.3*  HCT 38.0 38.4 31.8*  PLT 271 279 226    COAGS: Recent Labs    10/21/19 0610  INR 1.1    BMP: Recent Labs    09/02/19 0918 10/01/19 1557 10/21/19 0610  NA 136 135 138  K 4.0 4.0 3.4*  CL 106 105 106  CO2 22 23 24   GLUCOSE 86 77 90  BUN 10 7 6   CALCIUM 8.2* 8.9 8.4*  CREATININE 0.65 0.75 0.68  GFRNONAA 116 104 >60  GFRAA 134 121 >60    LIVER FUNCTION TESTS: Recent Labs    09/02/19 0918 10/01/19 1557  BILITOT 0.1* 0.3  AST 17 15  ALT 17 15  PROT 7.7  7.6 8.1    TUMOR MARKERS: No results for input(s): AFPTM, CEA, CA199, CHROMGRNA in the last 8760 hours.  Assessment and Plan:  33 y.o, female outpatient. History of systemic lupus erythematosus dermatosis and proteinuria. Team is requesting kidney biopsy for further evaluation of her proteinura .  Pertinent Imaging none  Pertinent IR History none  Pertinent Allergies PCN Sulfa   All labs and medications are within acceptable parameters.  Patient is afebrile.   Risks and benefits of kidney biopsy was discussed with the patient and/or patient's family including, but not limited to bleeding, infection, damage to adjacent structures or low yield requiring additional tests.  All of the questions were answered and there is agreement to proceed.  Consent signed and in chart.   Thank you for this interesting consult.  I greatly enjoyed meeting Melora M Clutter and look forward to participating in their care.  A copy of this report was sent to the requesting provider on this date.   Electronically Signed: Avel Peace, NP 10/21/2019, 7:07 AM   I spent a total of 40 Minutes    in face to face in clinical consultation, greater than 50% of which was counseling/coordinating care for liver biopsy

## 2019-10-30 NOTE — Progress Notes (Signed)
Office Visit Note  Patient: Kristin Murphy             Date of Birth: [birth year]           MRN: [id number]             PCP: [facility] Referring: No ref. provider found Visit Date: 11/12/2019 Occupation: @GUAROCC @  Subjective:  Medication management.   History of Present Illness: Kristin Murphy is a 34 y.o. female with history of systemic lupus dermatosis.  She had renal biopsy on May 25th 2021 which came positive for membranous lupus nephritis class V.  She was placed on Plaquenil which she has been taking on a regular basis.  She has noticed improvement in her joint symptoms.  She currently does not have any symptoms.  Fatigue has improved.  She denies any history of sores in her mouth or nose, Raynaud's, photosensitivity.  Activities of Daily Living:  Patient reports morning stiffness for 0 minutes.   Patient Denies nocturnal pain.  Difficulty dressing/grooming: Denies Difficulty climbing stairs: Denies Difficulty getting out of chair: Denies Difficulty using hands for taps, buttons, cutlery, and/or writing: Denies  Review of Systems  Constitutional: Negative for fatigue, night sweats, weight gain and weight loss.  HENT: Negative for mouth sores, trouble swallowing, trouble swallowing, mouth dryness and nose dryness.   Eyes: Positive for dryness. Negative for pain, redness, itching and visual disturbance.  Respiratory: Negative for cough, shortness of breath, wheezing and difficulty breathing.   Cardiovascular: Negative for chest pain, palpitations, hypertension, irregular heartbeat and swelling in legs/feet.  Gastrointestinal: Negative for blood in stool, constipation and diarrhea.  Endocrine: Negative for increased urination.  Genitourinary: Negative for difficulty urinating and vaginal dryness.  Musculoskeletal: Negative for arthralgias, joint pain, joint swelling, myalgias, muscle weakness, morning stiffness, muscle tenderness and myalgias.  Skin:  Negative for color change, rash, hair loss, redness, skin tightness, ulcers and sensitivity to sunlight.  Allergic/Immunologic: Negative for susceptible to infections.  Neurological: Negative for dizziness, numbness, headaches, memory loss, night sweats and weakness.  Hematological: Negative for bruising/bleeding tendency and swollen glands.  Psychiatric/Behavioral: Negative for depressed mood, confusion and sleep disturbance. The patient is not nervous/anxious.     PMFS History:  Patient Active Problem List   Diagnosis Date Noted  . Systemic lupus erythematosus (SLE) in adult (HCC) 11/12/2019  . Other proteinuria 11/12/2019  . Vitamin D deficiency 11/12/2019  . History of asthma 11/12/2019  . Seasonal allergies 11/12/2019  . Former smoker 11/12/2019    Past Medical History:  Diagnosis Date  . Asthma   . Rheumatoid arthritis(714.0)     Family History  Problem Relation Age of Onset  . Hypertension Mother   . Cancer Mother   . Arthritis Father   . Hypertension Other   . Diabetes Other   . Arthritis Other   . Sarcoidosis Paternal Grandmother   . Sarcoidosis Other    Past Surgical History:  Procedure Laterality Date  . RENAL BIOPSY    . WRIST SURGERY     biopsy   Social History   Social History Narrative  . Not on file    There is no immunization history on file for this patient.   Objective: Vital Signs: BP 121/80 (BP Location: Left Wrist, Patient Position: Sitting, Cuff Size: Normal)   Pulse 74   Resp 14   Ht 5' 6.5" (1.689 m)   Wt 229 lb 12.8 oz (104.2 kg)   BMI 36.54 kg/m    Physical  Exam Vitals and nursing note reviewed.  Constitutional:      Appearance: She is well-developed.  HENT:     Head: Normocephalic and atraumatic.  Eyes:     Conjunctiva/sclera: Conjunctivae normal.  Cardiovascular:     Rate and Rhythm: Normal rate and regular rhythm.     Heart sounds: Normal heart sounds.  Pulmonary:     Effort: Pulmonary effort is normal.     Breath  sounds: Normal breath sounds.  Abdominal:     General: Bowel sounds are normal.     Palpations: Abdomen is soft.  Musculoskeletal:     Cervical back: Normal range of motion.  Lymphadenopathy:     Cervical: No cervical adenopathy.  Skin:    General: Skin is warm and dry.     Capillary Refill: Capillary refill takes less than 2 seconds.  Neurological:     Mental Status: She is alert and oriented to person, place, and time.  Psychiatric:        Behavior: Behavior normal.      Musculoskeletal Exam: C-spine thoracic and lumbar spine with good range of motion.  Shoulder joints, elbow joints, wrist joints, MCPs PIPs and DIPs with good range of motion with no synovitis.  Hip joints, knee joints, ankles, MTPs and PIPs with good range of motion with no synovitis.  CDAI Exam: CDAI Score: -- Patient Global: --; Provider Global: -- Swollen: --; Tender: -- Joint Exam 11/12/2019   No joint exam has been documented for this visit   There is currently no information documented on the homunculus. Go to the Rheumatology activity and complete the homunculus joint exam.  Investigation: No additional findings.  Imaging: US BIOPSY (KIDNEY)  Result Date: 10/21/2019 INDICATION: History of lupus, now with proteinuria. Please perform ultrasound-guided renal biopsy for tissue diagnostic purposes. EXAM: ULTRASOUND GUIDED RENAL BIOPSY COMPARISON:  None. MEDICATIONS: None. ANESTHESIA/SEDATION: Fentanyl 75 mcg IV; Versed 1.5 mg IV Total Moderate Sedation time: 12 minutes; The patient was continuously monitored during the procedure by the interventional radiology nurse under my direct supervision. COMPLICATIONS: None immediate. PROCEDURE: Informed written consent was obtained from the patient after a discussion of the risks, benefits and alternatives to treatment. The patient understands and consents the procedure. A timeout was performed prior to the initiation of the procedure. Ultrasound scanning was performed  of the bilateral flanks. The inferior pole of the left kidney was selected for biopsy due to location and sonographic window. The procedure was planned. The operative site was prepped and draped in the usual sterile fashion. The overlying soft tissues were anesthetized with 1% lidocaine with epinephrine. A 17 gauge core needle biopsy device was advanced into the inferior cortex of the left kidney and 3 core biopsies were obtained under direct ultrasound guidance. Images were saved for documentation purposes. The biopsy device was removed and hemostasis was obtained with manual compression. Post procedural scanning was negative for significant post procedural hemorrhage or additional complication. A dressing was placed. The patient tolerated the procedure well without immediate post procedural complication. IMPRESSION: Technically successful ultrasound guided left renal biopsy. Electronically Signed   By: Sandi Mariscal M.D.   On: 10/21/2019 09:49    Recent Labs: Lab Results  Component Value Date   WBC 4.7 10/21/2019   HGB 10.3 (L) 10/21/2019   PLT 226 10/21/2019   NA 138 10/21/2019   K 3.4 (L) 10/21/2019   CL 106 10/21/2019   CO2 24 10/21/2019   GLUCOSE 90 10/21/2019   BUN 6 10/21/2019  CREATININE 0.68 10/21/2019   BILITOT 0.3 10/01/2019   AST 15 10/01/2019   ALT 15 10/01/2019   PROT 8.1 10/01/2019   CALCIUM 8.4 (L) 10/21/2019   GFRAA >60 10/21/2019   QFTBGOLDPLUS NEGATIVE 09/02/2019    Speciality Comments: No specialty comments available.  Procedures:  No procedures performed Allergies: Amoxicillin, Paba derivatives, Penicillins, and Sulfa antibiotics   Assessment / Plan:     Visit Diagnoses: Systemic lupus erythematosus (SLE) in adult (HCC) - Positive ANA, positive Ro, positive Sm, positive RNP, proteinuria, inflammatory arthritis, lupus nephritis.   -Patient is feeling much better on Plaquenil.  She has been on it for almost 2 months now.  She denies any joint pain or joint swelling.   Plan: CBC with Differential/Platelet, COMPLETE METABOLIC PANEL WITH GFR, Urinalysis, Routine w reflex microscopic, Anti-DNA antibody, double-stranded, C3 and C4, Sedimentation rate  Lupus nephritis, ISN/RPS class V (HCC) - Renal biopsy Oct 21, 2019 consistent with membranous lupus nephritis (class V), proteinuria.  Followed by Dr. Marisue Humble at [facility].  On chart review I found that Dr. Marisue Humble is having difficulty reaching the patient.  While she was in the office we scheduled an appointment for the patient to see Dr. Marisue Humble on Monday.  I also spoke with Dr. Kathrene Bongo who said there is no need to put patient on prednisone at this time.  She should be able to start on immunosuppressive agent soon.  High risk medication use - Plaquenil 200 mg twice daily  Contracture of joint of both elbows-resolved.  Other fatigue-improved.  Vitamin D deficiency  History of asthma  Seasonal allergies  Former smoker  Orders: Orders Placed This Encounter  Procedures  . CBC with Differential/Platelet  . COMPLETE METABOLIC PANEL WITH GFR  . Urinalysis, Routine w reflex microscopic  . Anti-DNA antibody, double-stranded  . C3 and C4  . Sedimentation rate   No orders of the defined types were placed in this encounter.     Follow-Up Instructions: Return in about 3 months (around 02/12/2020) for Systemic lupus, lupus nephritis.   Pollyann Savoy, MD  Note - This record has been created using Animal nutritionist.  Chart creation errors have been sought, but may not always  have been located. Such creation errors do not reflect on  the standard of medical care.

## 2019-11-12 ENCOUNTER — Encounter: Payer: Self-pay | Admitting: Rheumatology

## 2019-11-12 ENCOUNTER — Other Ambulatory Visit: Payer: Self-pay

## 2019-11-12 ENCOUNTER — Ambulatory Visit (INDEPENDENT_AMBULATORY_CARE_PROVIDER_SITE_OTHER): Payer: PRIVATE HEALTH INSURANCE | Admitting: Rheumatology

## 2019-11-12 VITALS — BP 121/80 | HR 74 | Resp 14 | Ht 66.5 in | Wt 229.8 lb

## 2019-11-12 DIAGNOSIS — Z79899 Other long term (current) drug therapy: Secondary | ICD-10-CM

## 2019-11-12 DIAGNOSIS — M24521 Contracture, right elbow: Secondary | ICD-10-CM | POA: Diagnosis not present

## 2019-11-12 DIAGNOSIS — R808 Other proteinuria: Secondary | ICD-10-CM | POA: Insufficient documentation

## 2019-11-12 DIAGNOSIS — M329 Systemic lupus erythematosus, unspecified: Secondary | ICD-10-CM | POA: Diagnosis not present

## 2019-11-12 DIAGNOSIS — M24522 Contracture, left elbow: Secondary | ICD-10-CM

## 2019-11-12 DIAGNOSIS — E559 Vitamin D deficiency, unspecified: Secondary | ICD-10-CM

## 2019-11-12 DIAGNOSIS — M3214 Glomerular disease in systemic lupus erythematosus: Secondary | ICD-10-CM | POA: Diagnosis not present

## 2019-11-12 DIAGNOSIS — R5383 Other fatigue: Secondary | ICD-10-CM

## 2019-11-12 DIAGNOSIS — Z87891 Personal history of nicotine dependence: Secondary | ICD-10-CM

## 2019-11-12 DIAGNOSIS — J302 Other seasonal allergic rhinitis: Secondary | ICD-10-CM | POA: Insufficient documentation

## 2019-11-12 DIAGNOSIS — Z8709 Personal history of other diseases of the respiratory system: Secondary | ICD-10-CM

## 2019-11-12 NOTE — Patient Instructions (Addendum)
Appointment with Dr. Marisue Humble on 11/18/2019 at 11:15am.    Standing Labs We placed an order today for your standing lab work.   Please have your standing labs drawn in August and every 3 months  If possible, please have your labs drawn 2 weeks prior to your appointment so that the provider can discuss your results at your appointment.  We have open lab daily Monday through Thursday from 8:30-12:30 PM and 1:30-4:30 PM and Friday from 8:30-12:30 PM and 1:30-4:00 PM at the office of Dr. Pollyann Savoy, [facility].   You may experience shorter wait times on Monday and Friday afternoons. The office is located at [address], [address], [place], [place] [address] No appointment is necessary.   Labs are drawn by [employer].  You may receive a bill from [facility] for your lab work.  If you wish to have your labs drawn at another location, please call the office 24 hours in advance to send orders.  If you have any questions regarding directions or hours of operation,  please call [phone].   As a reminder, please drink plenty of water prior to coming for your lab work. Thanks!

## 2019-11-17 ENCOUNTER — Other Ambulatory Visit: Payer: Self-pay | Admitting: Rheumatology

## 2019-11-17 DIAGNOSIS — E559 Vitamin D deficiency, unspecified: Secondary | ICD-10-CM

## 2019-11-17 NOTE — Telephone Encounter (Signed)
Patient advised she will need to have to have Vitamin D repeated prior to refill.

## 2019-11-18 ENCOUNTER — Other Ambulatory Visit: Payer: Self-pay

## 2019-11-18 DIAGNOSIS — E559 Vitamin D deficiency, unspecified: Secondary | ICD-10-CM

## 2019-11-19 LAB — VITAMIN D 25 HYDROXY (VIT D DEFICIENCY, FRACTURES): Vit D, 25-Hydroxy: 115 ng/mL — ABNORMAL HIGH (ref 30–100)

## 2019-11-19 NOTE — Progress Notes (Signed)
Vitamin D is  high.  Please advise patient to stop vitamin D.  She will start vitamin D 1000 units daily over-the-counter after 1 month.

## 2019-11-27 ENCOUNTER — Other Ambulatory Visit: Payer: Self-pay | Admitting: Rheumatology

## 2019-11-27 DIAGNOSIS — M329 Systemic lupus erythematosus, unspecified: Secondary | ICD-10-CM

## 2019-11-27 NOTE — Telephone Encounter (Addendum)
Last visit: 11/12/2019 Next visit: 02/12/2020 Labs: 10/01/2019 Decrease in WBCs and absolute neutrophils was noted. It is most likely related to the Plaquenil use. No change in therapy needed. Repeat WBC 10/21/2019: 4.7 Neutro Abs 1.5 PLQ Eye Exam: no baseline PLQ eye exam on file (start April 2021)   Patient advised we need her PLQ eye exam. Patient advised to make appointment and contact the office with appointment date and time.   Current Dose per office note 09/02/2019: Plaquenil 200 mg p.o. twice daily  Okay to refill PLQ?

## 2019-12-08 ENCOUNTER — Ambulatory Visit: Payer: PRIVATE HEALTH INSURANCE | Attending: Internal Medicine

## 2019-12-08 DIAGNOSIS — Z23 Encounter for immunization: Secondary | ICD-10-CM

## 2019-12-08 NOTE — Progress Notes (Signed)
   Covid-19 Vaccination Clinic  Name:  Kristin Murphy    MRN: [id number] DOB: [birth year]  12/08/2019  Kristin Murphy was observed post Covid-19 immunization for 15 minutes without incident. She was provided with Vaccine Information Sheet and instruction to access the V-Safe system.   Kristin Murphy was instructed to call 911 with any severe reactions post vaccine: Marland Kitchen Difficulty breathing  . Swelling of face and throat  . A fast heartbeat  . A bad rash all over body  . Dizziness and weakness   Immunizations Administered    Name Date Dose VIS Date Route   Pfizer COVID-19 Vaccine 12/08/2019  2:10 PM 0.3 mL 07/23/2018 Intramuscular   Manufacturer: [employer], [employer]   Lot: [id number]   NDC: [address]-6

## 2019-12-09 ENCOUNTER — Encounter (HOSPITAL_COMMUNITY): Payer: Self-pay

## 2019-12-09 LAB — SURGICAL PATHOLOGY

## 2019-12-30 ENCOUNTER — Ambulatory Visit: Payer: PRIVATE HEALTH INSURANCE

## 2020-01-19 ENCOUNTER — Telehealth: Payer: Self-pay | Admitting: Rheumatology

## 2020-01-19 NOTE — Telephone Encounter (Signed)
Patient states when her refill of PLQ was due she was unable to afford it at that time. Patient thought her prescription had expired. Patient advised she should have additional refills at the pharmacy as we sent the prescription in for a 30 day supply with 2 additional refills. Patient states she will contact the pharmacy.

## 2020-01-19 NOTE — Telephone Encounter (Signed)
Patient left a voicemail requesting a return call regarding her Plaquenil.

## 2020-02-03 NOTE — Progress Notes (Signed)
Office Visit Note  Patient: Kristin Murphy             Date of Birth: 1985/10/09           MRN: [id number]             [facility]P: [facility] Referring: [facility] Visit Date: 02/12/2020 Occupation: @GUAROCC @  Subjective:  Other (bilateral foot/leg numbness, dark spots on bottoms of feet )   History of Present Illness: Kristin Murphy is a 34 y.o. female with history of systemic lupus and lupus nephritis.  She states she was advised to discontinue ACE inhibitor's by the nephrologist.  No additional treatment is needed based on nephritis.  She states she has been experiencing increased pain and discomfort in her left elbow which is locked now.  She had problems off and on in her elbow joints.  She also has noticed darker spots on the bottom of her feet.  She states she experiences tingling in her feet at times.  Activities of Daily Living:  Patient reports morning stiffness for several  minutes.   Patient Reports nocturnal pain.  Difficulty dressing/grooming: Denies Difficulty climbing stairs: Denies Difficulty getting out of chair: Denies Difficulty using hands for taps, buttons, cutlery, and/or writing: Denies  Review of Systems  Constitutional: Positive for fatigue.  HENT: Negative for mouth sores, mouth dryness and nose dryness.   Eyes: Positive for dryness. Negative for pain and itching.  Respiratory: Positive for difficulty breathing. Negative for shortness of breath.   Cardiovascular: Negative for chest pain and palpitations.  Gastrointestinal: Positive for constipation and diarrhea. Negative for blood in stool.  Endocrine: Negative for increased urination.  Genitourinary: Negative for difficulty urinating.  Musculoskeletal: Positive for arthralgias, joint pain and morning stiffness. Negative for joint swelling, myalgias, muscle tenderness and myalgias.  Skin: Negative for color change, rash and redness.  Allergic/Immunologic: Negative for  susceptible to infections.  Neurological: Positive for numbness, headaches and weakness. Negative for dizziness and memory loss.  Hematological: Positive for bruising/bleeding tendency.  Psychiatric/Behavioral: Negative for confusion.    PMFS History:  Patient Active Problem List   Diagnosis Date Noted  . Systemic lupus erythematosus (SLE) in adult (HCC) 11/12/2019  . Other proteinuria 11/12/2019  . Vitamin D deficiency 11/12/2019  . History of asthma 11/12/2019  . Seasonal allergies 11/12/2019  . Former smoker 11/12/2019    Past Medical History:  Diagnosis Date  . Asthma   . Rheumatoid arthritis(714.0)     Family History  Problem Relation Age of Onset  . Hypertension Mother   . Cancer Mother   . Arthritis Father   . Hypertension Other   . Diabetes Other   . Arthritis Other   . Sarcoidosis Paternal Grandmother   . Sarcoidosis Other    Past Surgical History:  Procedure Laterality Date  . RENAL BIOPSY    . WRIST SURGERY     biopsy   Social History   Social History Narrative  . Not on file   Immunization History  Administered Date(s) Administered  . PFIZER SARS-COV-2 Vaccination 12/08/2019, 12/29/2019     Objective: Vital Signs: BP 113/79 (BP Location: Left Arm, Patient Position: Sitting, Cuff Size: Normal)   Pulse 76   Resp 15   Ht 5\' 6"  (1.676 m)   Wt 225 lb (102.1 kg)   BMI 36.32 kg/m    Physical Exam Vitals and nursing note reviewed.  Constitutional:      Appearance: She is well-developed.  HENT:  Head: Normocephalic and atraumatic.  Eyes:     Conjunctiva/sclera: Conjunctivae normal.     Comments: Conjunctival injection was noted.  Cardiovascular:     Rate and Rhythm: Normal rate and regular rhythm.     Heart sounds: Normal heart sounds.  Pulmonary:     Effort: Pulmonary effort is normal.     Breath sounds: Normal breath sounds.  Abdominal:     General: Bowel sounds are normal.     Palpations: Abdomen is soft.  Musculoskeletal:      Cervical back: Normal range of motion.  Lymphadenopathy:     Cervical: No cervical adenopathy.  Skin:    General: Skin is warm and dry.     Capillary Refill: Capillary refill takes less than 2 seconds.     Comments: Hyperpigmented lesions were noted on the plantar surface bilaterally.  Neurological:     Mental Status: She is alert and oriented to person, place, and time.  Psychiatric:        Behavior: Behavior normal.      Musculoskeletal Exam: C-spine was in good range of motion.  Shoulder joints were in good range of motion.  She had left elbow joint contracture and synovitis.  Wrist joints, MCPs PIPs and DIPs with good range of motion.  Hip joints, knee joints, ankles with good range of motion with no synovitis.  CDAI Exam: CDAI Score: -- Patient Global: --; Provider Global: -- Swollen: --; Tender: -- Joint Exam 02/12/2020   No joint exam has been documented for this visit   There is currently no information documented on the homunculus. Go to the Rheumatology activity and complete the homunculus joint exam.  Investigation: No additional findings.  Imaging: No results found.  Recent Labs: Lab Results  Component Value Date   WBC 4.7 10/21/2019   HGB 10.3 (L) 10/21/2019   PLT 226 10/21/2019   NA 138 10/21/2019   K 3.4 (L) 10/21/2019   CL 106 10/21/2019   CO2 24 10/21/2019   GLUCOSE 90 10/21/2019   BUN 6 10/21/2019   CREATININE 0.68 10/21/2019   BILITOT 0.3 10/01/2019   AST 15 10/01/2019   ALT 15 10/01/2019   PROT 8.1 10/01/2019   CALCIUM 8.4 (L) 10/21/2019   GFRAA >60 10/21/2019   QFTBGOLDPLUS NEGATIVE 09/02/2019    Speciality Comments: No specialty comments available.  Procedures:  No procedures performed Allergies: Amoxicillin, Paba derivatives, Penicillins, and Sulfa antibiotics   Assessment / Plan:     Visit Diagnoses: Systemic lupus erythematosus (SLE) in adult (HCC) -  Positive ANA, positive Ro, positive Sm, positive RNP, proteinuria, inflammatory  arthritis, lupus nephritis. -Patient appears to be having a flare.  She has warmth and swelling in her elbow joint.  None of the other joints are swollen.  She has been experiencing increased fatigue.  She is also noticed darker spots over bilateral plantar surface.  Plan: Anti-DNA antibody, double-stranded, C3 and C4, Sedimentation rate, Ambulatory referral to Dermatology.  I detailed discussion with patient regarding different treatment options and their side effects.  Indications side effects contraindications of Imuran were discussed.  Patient is willing to proceed with the medication.  Her TPMT was normal in the past.  I plan to start her on Imuran 50 mg p.o. daily and increase to 200 mg p.o. daily after 2 weeks if the labs are stable.  We will check labs every 2 weeks x 2 and then every 2 months to monitor for drug toxicity.  Medication counseling:  TPMT: Normal  Patient was counseled on the purpose, proper use, and adverse effects of azathioprine including risk of infection, nausea, rash, and hair loss.  Reviewed risk of cancer after long term use.  Discussed risk of myelosupression and reviewed importance of frequent lab work to monitor blood counts.  Reviewed drug-drug interactions including contraindication with allopurinol.  Provided patient with educational materials on azathioprine and answered all questions.  Patient consented to azathioprine.  Will upload consent into the media tab.   Lupus nephritis, ISN/RPS class V (HCC) - Renal biopsy Oct 21, 2019 consistent with membranous lupus nephritis (class V), proteinuria.  Followed by Dr. Marisue Humble at [facility].    Rash and other nonspecific skin eruption -she had hyperpigmented lesions on the plantar surface.  I am concerned that she may have vasculitis.  I will refer her to dermatology urgently for skin biopsy.  I will place her on prednisone starting at 30 mg p.o. daily and taper by 5 mg every week.  Side effects of prednisone  were discussed at length.  Plan: Ambulatory referral to Dermatology  High risk medication use - Plaquenil 200 mg twice daily - Plan: CBC with Differential/Platelet, COMPLETE METABOLIC PANEL WITH GFR today.  Contracture of joint of both elbows-she is contracture in her left elbow today.  She also has some tenderness on palpation.  Other fatigue-she continues to have fatigue.  Vitamin D deficiency-her vitamin D was high.  She was advised to stop vitamin D.  History of asthma  Seasonal allergies  Former smoker    Orders: Orders Placed This Encounter  Procedures  . CBC with Differential/Platelet  . COMPLETE METABOLIC PANEL WITH GFR  . Anti-DNA antibody, double-stranded  . C3 and C4  . Sedimentation rate  . Ambulatory referral to Dermatology   Meds ordered this encounter  Medications  . predniSONE (DELTASONE) 5 MG tablet    Sig: Take 6 tabs po qd x 7 days, 5  tabs po qd x 7 days, 4  tabs po qd x 7 days, 3  tabs po qd x 7 days, 2 tabs po qd x 7 days, 1 tab po qd x 7 days.    Dispense:  147 tablet    Refill:  0  . azaTHIOprine (IMURAN) 50 MG tablet    Sig: Take 50mg  by mouth daily for 2 weeks, if labs are stable, increase to 100mg  by mouth daily.    Dispense:  42 tablet    Refill:  0     Follow-Up Instructions: Return in about 6 weeks (around 03/25/2020) for Systemic lupus.   , MD  Note - This record has been created using 03/27/2020.  Chart creation errors have been sought, but may not always  have been located. Such creation errors do not reflect on  the standard of medical care.

## 2020-02-04 ENCOUNTER — Telehealth: Payer: Self-pay | Admitting: Rheumatology

## 2020-02-04 NOTE — Telephone Encounter (Signed)
Patient was unable to attend appointment with Dr. Dione Booze today due to expense. Patient would like to discuss going elsewhere that she could possible afford.

## 2020-02-04 NOTE — Telephone Encounter (Signed)
Patient advised she will have to call around to different eye doctors to find out if there where on more affordable may be. Patient advised we will need PLQ eye exam as soon as possible,

## 2020-02-06 DIAGNOSIS — M3214 Glomerular disease in systemic lupus erythematosus: Secondary | ICD-10-CM | POA: Diagnosis not present

## 2020-02-06 DIAGNOSIS — R319 Hematuria, unspecified: Secondary | ICD-10-CM | POA: Diagnosis not present

## 2020-02-12 ENCOUNTER — Encounter: Payer: Self-pay | Admitting: Rheumatology

## 2020-02-12 ENCOUNTER — Ambulatory Visit (INDEPENDENT_AMBULATORY_CARE_PROVIDER_SITE_OTHER): Payer: BC Managed Care – PPO | Admitting: Rheumatology

## 2020-02-12 ENCOUNTER — Other Ambulatory Visit: Payer: Self-pay

## 2020-02-12 VITALS — BP 113/79 | HR 76 | Resp 15 | Ht 66.0 in | Wt 225.0 lb

## 2020-02-12 DIAGNOSIS — M329 Systemic lupus erythematosus, unspecified: Secondary | ICD-10-CM

## 2020-02-12 DIAGNOSIS — Z79899 Other long term (current) drug therapy: Secondary | ICD-10-CM

## 2020-02-12 DIAGNOSIS — M3214 Glomerular disease in systemic lupus erythematosus: Secondary | ICD-10-CM

## 2020-02-12 DIAGNOSIS — R5383 Other fatigue: Secondary | ICD-10-CM

## 2020-02-12 DIAGNOSIS — M24521 Contracture, right elbow: Secondary | ICD-10-CM | POA: Diagnosis not present

## 2020-02-12 DIAGNOSIS — Z8709 Personal history of other diseases of the respiratory system: Secondary | ICD-10-CM

## 2020-02-12 DIAGNOSIS — R21 Rash and other nonspecific skin eruption: Secondary | ICD-10-CM

## 2020-02-12 DIAGNOSIS — M24522 Contracture, left elbow: Secondary | ICD-10-CM

## 2020-02-12 DIAGNOSIS — J302 Other seasonal allergic rhinitis: Secondary | ICD-10-CM

## 2020-02-12 DIAGNOSIS — E559 Vitamin D deficiency, unspecified: Secondary | ICD-10-CM

## 2020-02-12 DIAGNOSIS — Z87891 Personal history of nicotine dependence: Secondary | ICD-10-CM

## 2020-02-12 DIAGNOSIS — Z7189 Other specified counseling: Secondary | ICD-10-CM

## 2020-02-12 MED ORDER — AZATHIOPRINE 50 MG PO TABS: ORAL_TABLET | ORAL | 0 refills | Status: DC

## 2020-02-12 MED ORDER — PREDNISONE 5 MG PO TABS: ORAL_TABLET | ORAL | 0 refills | Status: DC

## 2020-02-12 NOTE — Patient Instructions (Addendum)
Azathioprine tablets What is this medicine? AZATHIOPRINE (ay za THYE oh preen) suppresses the immune system. It is used to prevent organ rejection after a transplant. It is also used to treat rheumatoid arthritis. This medicine may be used for other purposes; ask your health care provider or pharmacist if you have questions. COMMON BRAND NAME(S): Azasan, Imuran What should I tell my health care provider before I take this medicine? They need to know if you have any of these conditions:  infection  kidney disease  liver disease  an unusual or allergic reaction to azathioprine, other medicines, lactose, foods, dyes, or preservatives  pregnant or trying to get pregnant  breast feeding How should I use this medicine? Take this medicine by mouth with a full glass of water. Follow the directions on the prescription label. Take your medicine at regular intervals. Do not take your medicine more often than directed. Continue to take your medicine even if you feel better. Do not stop taking except on your doctor's advice. Talk to your pediatrician regarding the use of this medicine in children. Special care may be needed. Overdosage: If you think you have taken too much of this medicine contact a poison control center or emergency room at once. NOTE: This medicine is only for you. Do not share this medicine with others. What if I miss a dose? If you miss a dose, take it as soon as you can. If it is almost time for your next dose, take only that dose. Do not take double or extra doses. What may interact with this medicine? Do not take this medicine with any of the following medications:  febuxostat  mercaptopurine This medicine may also interact with the following medications:  allopurinol  aminosalicylates like sulfasalazine, mesalamine, balsalazide, and olsalazine  leflunomide  medicines called ACE inhibitors like benazepril, captopril, enalapril, fosinopril, quinapril, lisinopril,  ramipril, and trandolapril  mycophenolate  sulfamethoxazole; trimethoprim  vaccines  warfarin This list may not describe all possible interactions. Give your health care provider a list of all the medicines, herbs, non-prescription drugs, or dietary supplements you use. Also tell them if you smoke, drink alcohol, or use illegal drugs. Some items may interact with your medicine. What should I watch for while using this medicine? Visit your doctor or health care professional for regular checks on your progress. You will need frequent blood checks during the first few months you are receiving the medicine. If you get a cold or other infection while receiving this medicine, call your doctor or health care professional. Do not treat yourself. The medicine may increase your risk of getting an infection. Women should inform their doctor if they wish to become pregnant or think they might be pregnant. There is a potential for serious side effects to an unborn child. Talk to your health care professional or pharmacist for more information. Men may have a reduced sperm count while they are taking this medicine. Talk to your health care professional for more information. This medicine may increase your risk of getting certain kinds of cancer. Talk to your doctor about healthy lifestyle choices, important screenings, and your risk. What side effects may I notice from receiving this medicine? Side effects that you should report to your doctor or health care professional as soon as possible:  allergic reactions like skin rash, itching or hives, swelling of the face, lips, or tongue  changes in vision  confusion  fever, chills, or any other sign of infection  loss of balance or coordination  severe stomach pain  unusual bleeding, bruising  unusually weak or tired  vomiting  yellowing of the eyes or skin Side effects that usually do not require medical attention (report to your doctor or health  care professional if they continue or are bothersome):  hair loss  nausea This list may not describe all possible side effects. Call your doctor for medical advice about side effects. You may report side effects to FDA at 1-800-FDA-1088. Where should I keep my medicine? Keep out of the reach of children. Store at room temperature between 15 and 25 degrees C (59 and 77 degrees F). Protect from light. Throw away any unused medicine after the expiration date. NOTE: This sheet is a summary. It may not cover all possible information. If you have questions about this medicine, talk to your doctor, pharmacist, or health care provider.  2020 Elsevier/Gold Standard (2013-09-09 12:00:31) COVID-19 vaccine recommendations:   COVID-19 vaccine is recommended for everyone (unless you are allergic to a vaccine component), even if you are on a medication that suppresses your immune system.   If you are on Methotrexate, Cellcept (mycophenolate), Rinvoq, Harriette Ohara, and Olumiant- hold the medication for 1 week after each vaccine. Hold Methotrexate for 2 weeks after the single dose COVID-19 vaccine.   If you are on Orencia subcutaneous injection - hold medication one week prior to and one week after the first COVID-19 vaccine dose (only).   If you are on Orencia IV infusions- time vaccination administration so that the first COVID-19 vaccination will occur four weeks after the infusion and postpone the subsequent infusion by one week.   If you are on Cyclophosphamide or Rituxan infusions please contact your doctor prior to receiving the COVID-19 vaccine.   Do not take Tylenol or any anti-inflammatory medications (NSAIDs) 24 hours prior to the COVID-19 vaccination.   There is no direct evidence about the efficacy of the COVID-19 vaccine in individuals who are on medications that suppress the immune system.   Even if you are fully vaccinated, and you are on any medications that suppress your immune system, please  continue to wear a mask, maintain at least six feet social distance and practice hand hygiene.   If you develop a COVID-19 infection, please contact your PCP or our office to determine if you need antibody infusion.  The booster vaccine is now available for immunocompromised patients. It is advised that if you had Pfizer vaccine you should get [employer] booster.  If you had a Moderna vaccine then you should get a Moderna booster. Johnson and Laural Benes does not have a booster vaccine at this time.  Please see the following web sites for updated information.   [email or web address]://[email or web address]/[email or web address]-Vaccination-Patient-Resources.[email or web address]  [email or web address]://[email or web address]/About-[email or web address]-Releases/ID/1159   Standing Labs We placed an order today for your standing lab work.   Please have your standing labs drawn in 2 weeks x 2 and then 2 months.  If possible, please have your labs drawn 2 weeks prior to your appointment so that the provider can discuss your results at your appointment.  We have open lab daily Monday through Thursday from 8:30-12:30 PM and 1:30-4:30 PM and Friday from 8:30-12:30 PM and 1:30-4:00 PM at the office of Dr. Pollyann Savoy, [facility].   Please be advised, patients with office appointments requiring lab work will take precedents over walk-in lab work.  If possible, please come for your lab work on Monday and Friday afternoons, as you may experience shorter wait times. The office is located at [address], [address], [place], [place] [address] No appointment is necessary.   Labs are drawn by [facility]. Please bring your co-pay at the time of your lab draw.  You may receive a bill from [facility] for your lab work.  If you wish to have your labs drawn at another location, please call the office 24 hours in advance to send orders.  If you have any questions regarding directions or hours of operation,  please call [phone].   As a reminder,  please drink plenty of water prior to coming for your lab work. Thanks!

## 2020-02-13 DIAGNOSIS — R21 Rash and other nonspecific skin eruption: Secondary | ICD-10-CM | POA: Diagnosis not present

## 2020-02-13 LAB — COMPLETE METABOLIC PANEL WITH GFR
AG Ratio: 0.7 (calc) — ABNORMAL LOW (ref 1.0–2.5)
ALT: 8 U/L (ref 6–29)
AST: 13 U/L (ref 10–30)
Albumin: 3.1 g/dL — ABNORMAL LOW (ref 3.6–5.1)
Alkaline phosphatase (APISO): 67 U/L (ref 31–125)
BUN: 9 mg/dL (ref 7–25)
CO2: 24 mmol/L (ref 20–32)
Calcium: 8.6 mg/dL (ref 8.6–10.2)
Chloride: 107 mmol/L (ref 98–110)
Creat: 0.75 mg/dL (ref 0.50–1.10)
GFR, Est African American: 121 mL/min/{1.73_m2} (ref >=60)
GFR, Est Non African American: 104 mL/min/{1.73_m2} (ref >=60)
Globulin: 4.2 g/dL (calc) — ABNORMAL HIGH (ref 1.9–3.7)
Glucose, Bld: 83 mg/dL (ref 65–99)
Potassium: 3.7 mmol/L (ref 3.5–5.3)
Sodium: 138 mmol/L (ref 135–146)
Total Bilirubin: 0.2 mg/dL (ref 0.2–1.2)
Total Protein: 7.3 g/dL (ref 6.1–8.1)

## 2020-02-13 LAB — CBC WITH DIFFERENTIAL/PLATELET
Absolute Monocytes: 599 cells/uL (ref 200–950)
Basophils Absolute: 49 cells/uL (ref 0–200)
Basophils Relative: 1.2 %
Eosinophils Absolute: 349 cells/uL (ref 15–500)
Eosinophils Relative: 8.5 %
HCT: 35.5 % (ref 35.0–45.0)
Hemoglobin: 11.7 g/dL (ref 11.7–15.5)
Lymphs Abs: 1919 cells/uL (ref 850–3900)
MCH: 28.3 pg (ref 27.0–33.0)
MCHC: 33 g/dL (ref 32.0–36.0)
MCV: 85.7 fL (ref 80.0–100.0)
MPV: 12.8 fL — ABNORMAL HIGH (ref 7.5–12.5)
Monocytes Relative: 14.6 %
Neutro Abs: 1185 cells/uL — ABNORMAL LOW (ref 1500–7800)
Neutrophils Relative %: 28.9 %
Platelets: 260 10*3/uL (ref 140–400)
RBC: 4.14 10*6/uL (ref 3.80–5.10)
RDW: 13.1 % (ref 11.0–15.0)
Total Lymphocyte: 46.8 %
WBC: 4.1 10*3/uL (ref 3.8–10.8)

## 2020-02-13 LAB — SEDIMENTATION RATE: Sed Rate: 38 mm/h — ABNORMAL HIGH (ref 0–20)

## 2020-02-13 LAB — C3 AND C4
C3 Complement: 82 mg/dL — ABNORMAL LOW (ref 83–193)
C4 Complement: 21 mg/dL (ref 15–57)

## 2020-02-13 LAB — ANTI-DNA ANTIBODY, DOUBLE-STRANDED: ds DNA Ab: 1 IU/mL

## 2020-02-16 ENCOUNTER — Telehealth: Payer: Self-pay | Admitting: *Deleted

## 2020-02-16 NOTE — Telephone Encounter (Signed)
Per patient she is rescheduling biopsy of her foot, she is going to reschedule the biopsy for next week or the week after because she is going on vacation for her birthday and does not want her foot to be tender.   Per 02/12/2020 office note: Rash and other nonspecific skin eruption -she had hyperpigmented lesions on the plantar surface.  I am concerned that she may have vasculitis.  I will refer her to dermatology urgently for skin biopsy.  I will place her on prednisone starting at 30 mg p.o. daily and taper by 5 mg every week.  Side effects of prednisone were discussed at length.  Plan: Ambulatory referral to Dermatology

## 2020-02-16 NOTE — Telephone Encounter (Signed)
Please see above message!

## 2020-02-16 NOTE — Progress Notes (Signed)
Labs are stable.

## 2020-02-18 NOTE — Progress Notes (Signed)
I called to check on patient.  She states she did not pick up the prednisone prescription.  She did not get biopsy as she did not have the money to pay for the biopsy.  She states she will be paid on October 1.  She will schedule an appointment for the biopsy.  Have advised her to start prednisone as soon as possible after the biopsy.  If her symptoms get worse then she can start prednisone sooner.

## 2020-03-23 ENCOUNTER — Telehealth: Payer: Self-pay

## 2020-03-23 NOTE — Telephone Encounter (Signed)
She needs labs now so Imuran dose can be increased.  She also needs to get  the skin biopsy.  She postpone her dermatology visit.

## 2020-03-23 NOTE — Telephone Encounter (Signed)
Patient advised she will need to come for labs prior to refill. Patient has only been taking Imuran 50 mg daily for 2 weeks. Patient did not have labs and then after 2 weeks increased to 2 tablets daily. Patient plans to come this week for labs. What would you like patient's lab schedule to be now?

## 2020-03-23 NOTE — Telephone Encounter (Signed)
Patient advised she  needs labs now so Imuran dose can be increased.  She also needs to get  the skin biopsy. Patient states she will schedule after she gets paid.

## 2020-03-23 NOTE — Telephone Encounter (Signed)
Patient called requesting prescription refill of Imuran to [facility] at [address]

## 2020-03-24 ENCOUNTER — Other Ambulatory Visit: Payer: Self-pay | Admitting: Rheumatology

## 2020-03-24 DIAGNOSIS — M329 Systemic lupus erythematosus, unspecified: Secondary | ICD-10-CM

## 2020-03-24 NOTE — Telephone Encounter (Addendum)
Last Visit: 02/12/2020 Next Visit: 03/30/2020 Labs: 02/12/2020 Labs are stable. Eye exam: no baseline eye exam on file  Current Dose per office note 02/12/2020: Plaquenil 200 mg twice daily  DX: Systemic lupus erythematosus   Attempted to contact patient to advise she needs PLQ eye exam. Unable to leave a message, mailbox is full.

## 2020-03-25 ENCOUNTER — Other Ambulatory Visit: Payer: Self-pay

## 2020-03-25 DIAGNOSIS — M329 Systemic lupus erythematosus, unspecified: Secondary | ICD-10-CM

## 2020-03-25 DIAGNOSIS — Z79899 Other long term (current) drug therapy: Secondary | ICD-10-CM | POA: Diagnosis not present

## 2020-03-26 LAB — CBC WITH DIFFERENTIAL/PLATELET
Absolute Monocytes: 454 cells/uL (ref 200–950)
Basophils Absolute: 40 cells/uL (ref 0–200)
Basophils Relative: 1.1 %
Eosinophils Absolute: 353 cells/uL (ref 15–500)
Eosinophils Relative: 9.8 %
HCT: 34.6 % — ABNORMAL LOW (ref 35.0–45.0)
Hemoglobin: 11.1 g/dL — ABNORMAL LOW (ref 11.7–15.5)
Lymphs Abs: 1786 cells/uL (ref 850–3900)
MCH: 27.8 pg (ref 27.0–33.0)
MCHC: 32.1 g/dL (ref 32.0–36.0)
MCV: 86.5 fL (ref 80.0–100.0)
MPV: 12.4 fL (ref 7.5–12.5)
Monocytes Relative: 12.6 %
Neutro Abs: 968 cells/uL — ABNORMAL LOW (ref 1500–7800)
Neutrophils Relative %: 26.9 %
Platelets: 258 10*3/uL (ref 140–400)
RBC: 4 10*6/uL (ref 3.80–5.10)
RDW: 13 % (ref 11.0–15.0)
Total Lymphocyte: 49.6 %
WBC: 3.6 10*3/uL — ABNORMAL LOW (ref 3.8–10.8)

## 2020-03-26 LAB — COMPLETE METABOLIC PANEL WITH GFR
AG Ratio: 0.9 (calc) — ABNORMAL LOW (ref 1.0–2.5)
ALT: 6 U/L (ref 6–29)
AST: 15 U/L (ref 10–30)
Albumin: 3.4 g/dL — ABNORMAL LOW (ref 3.6–5.1)
Alkaline phosphatase (APISO): 57 U/L (ref 31–125)
BUN: 8 mg/dL (ref 7–25)
CO2: 26 mmol/L (ref 20–32)
Calcium: 8.7 mg/dL (ref 8.6–10.2)
Chloride: 108 mmol/L (ref 98–110)
Creat: 0.71 mg/dL (ref 0.50–1.10)
GFR, Est African American: 128 mL/min/{1.73_m2} (ref >=60)
GFR, Est Non African American: 110 mL/min/{1.73_m2} (ref >=60)
Globulin: 4 g/dL (calc) — ABNORMAL HIGH (ref 1.9–3.7)
Glucose, Bld: 78 mg/dL (ref 65–99)
Potassium: 3.9 mmol/L (ref 3.5–5.3)
Sodium: 137 mmol/L (ref 135–146)
Total Bilirubin: 0.3 mg/dL (ref 0.2–1.2)
Total Protein: 7.4 g/dL (ref 6.1–8.1)

## 2020-03-26 NOTE — Progress Notes (Signed)
Mild neutropenia noted.  I will discuss results at the follow-up visit.  Patient asked patient if she had a skin biopsy done by dermatology.

## 2020-03-30 ENCOUNTER — Ambulatory Visit (INDEPENDENT_AMBULATORY_CARE_PROVIDER_SITE_OTHER): Payer: BC Managed Care – PPO | Admitting: Rheumatology

## 2020-03-30 ENCOUNTER — Other Ambulatory Visit: Payer: Self-pay

## 2020-03-30 ENCOUNTER — Encounter: Payer: Self-pay | Admitting: Rheumatology

## 2020-03-30 VITALS — BP 113/74 | HR 73 | Resp 14 | Ht 66.0 in | Wt 230.0 lb

## 2020-03-30 DIAGNOSIS — M24521 Contracture, right elbow: Secondary | ICD-10-CM

## 2020-03-30 DIAGNOSIS — E559 Vitamin D deficiency, unspecified: Secondary | ICD-10-CM

## 2020-03-30 DIAGNOSIS — M3214 Glomerular disease in systemic lupus erythematosus: Secondary | ICD-10-CM | POA: Diagnosis not present

## 2020-03-30 DIAGNOSIS — R808 Other proteinuria: Secondary | ICD-10-CM | POA: Diagnosis not present

## 2020-03-30 DIAGNOSIS — Z7189 Other specified counseling: Secondary | ICD-10-CM

## 2020-03-30 DIAGNOSIS — Z79899 Other long term (current) drug therapy: Secondary | ICD-10-CM | POA: Diagnosis not present

## 2020-03-30 DIAGNOSIS — J302 Other seasonal allergic rhinitis: Secondary | ICD-10-CM

## 2020-03-30 DIAGNOSIS — M24522 Contracture, left elbow: Secondary | ICD-10-CM

## 2020-03-30 DIAGNOSIS — R21 Rash and other nonspecific skin eruption: Secondary | ICD-10-CM

## 2020-03-30 DIAGNOSIS — Z87891 Personal history of nicotine dependence: Secondary | ICD-10-CM

## 2020-03-30 DIAGNOSIS — Z8709 Personal history of other diseases of the respiratory system: Secondary | ICD-10-CM

## 2020-03-30 DIAGNOSIS — M329 Systemic lupus erythematosus, unspecified: Secondary | ICD-10-CM | POA: Diagnosis not present

## 2020-03-30 MED ORDER — AZATHIOPRINE 50 MG PO TABS
100.0000 mg | ORAL_TABLET | Freq: Every day | ORAL | 2 refills | Status: DC
Start: 2020-03-30 — End: 2020-04-23

## 2020-03-30 NOTE — Patient Instructions (Signed)
COVID-19 vaccine recommendations:   COVID-19 vaccine is recommended for everyone (unless you are allergic to a vaccine component), even if you are on a medication that suppresses your immune system.   If you are on Methotrexate, Cellcept (mycophenolate), Rinvoq, Harriette Ohara, and Olumiant- hold the medication for 1 week after each vaccine. Hold Methotrexate for 2 weeks after the single dose COVID-19 vaccine.   If you are on Orencia subcutaneous injection - hold medication one week prior to and one week after the first COVID-19 vaccine dose (only).   If you are on Orencia IV infusions- time vaccination administration so that the first COVID-19 vaccination will occur four weeks after the infusion and postpone the subsequent infusion by one week.   If you are on Cyclophosphamide or Rituxan infusions please contact your doctor prior to receiving the COVID-19 vaccine.   Do not take Tylenol or any anti-inflammatory medications (NSAIDs) 24 hours prior to the COVID-19 vaccination.   There is no direct evidence about the efficacy of the COVID-19 vaccine in individuals who are on medications that suppress the immune system.   Even if you are fully vaccinated, and you are on any medications that suppress your immune system, please continue to wear a mask, maintain at least six feet social distance and practice hand hygiene.   If you develop a COVID-19 infection, please contact your PCP or our office to determine if you need monoclonal antibody infusion.  The booster vaccine is now available for immunocompromised patients.   Please see the following web sites for updated information.   [email or web address]   Standing Labs We placed an order today for your standing lab work.   Please have your standing labs drawn in 1 month and every 3 months  If possible, please have your labs drawn 2 weeks prior to your appointment so that the  provider can discuss your results at your appointment.  We have open lab daily Monday through Thursday from 8:30-12:30 PM and 1:30-4:30 PM and Friday from 8:30-12:30 PM and 1:30-4:00 PM at the office of Dr. Pollyann Savoy, [facility].   Please be advised, patients with office appointments requiring lab work will take precedents over walk-in lab work.  If possible, please come for your lab work on Monday and Friday afternoons, as you may experience shorter wait times. The office is located at [address], [place], [place] [address] No appointment is necessary.   Labs are drawn by [facility]. Please bring your co-pay at the time of your lab draw.  You may receive a bill from [facility] for your lab work.  If you wish to have your labs drawn at another location, please call the office 24 hours in advance to send orders.  If you have any questions regarding directions or hours of operation,  please call [phone].   As a reminder, please drink plenty of water prior to coming for your lab work. Thanks!

## 2020-03-30 NOTE — Progress Notes (Addendum)
Office Visit Note  Patient: Kristin Murphy             Date of Birth: 08/10/1985           MRN: [id number]             PCP: [facility] [place] Referring: [facility] [place] Visit Date: 03/30/2020 Occupation: @GUAROCC @  Subjective:  Lupus (Doing good, wants to go back on Imuran)   History of Present Illness: Kristin Murphy is a 34 y.o. female with history of systemic lupus erythematosus, lupus nephritis, proteinuria and inflammatory arthritis.  She returns today after her last visit on February 12, 2020.  At that time she was placed on prednisone taper and also was a started on Imuran 50 mg p.o. daily.  Patient tapered off prednisone and continued Imuran.  She states after taking Imuran her symptoms improved remarkably without any joint pain and joint swelling.  The contractures in her elbows resolved.  She took Imuran 50 mg for 2 weeks and had lab work and increased Imuran to 2 tablets p.o. daily.  She states she ran out of the medication and did not refill the medication.  She also went to see the dermatologist and did not want to do the skin biopsy at the time due to financial reasons.  She has an appointment with the dermatologist in December.  Activities of Daily Living:  Patient reports morning stiffness for 0 NONE.   Patient Denies nocturnal pain.  Difficulty dressing/grooming: Reports Difficulty climbing stairs: Reports Difficulty getting out of chair: Denies Difficulty using hands for taps, buttons, cutlery, and/or writing: Reports  Review of Systems  Constitutional: Positive for fatigue.  HENT: Negative for mouth dryness.   Eyes: Positive for dryness.  Respiratory: Negative for shortness of breath.   Cardiovascular: Negative for swelling in legs/feet.  Gastrointestinal: Positive for diarrhea.  Endocrine: Positive for heat intolerance, excessive thirst and increased urination.  Genitourinary: Negative for difficulty urinating.  Musculoskeletal: Positive for  arthralgias, gait problem, joint pain, muscle weakness and muscle tenderness.  Skin: Positive for rash.  Allergic/Immunologic: Negative for susceptible to infections.  Neurological: Positive for numbness.  Hematological: Positive for bruising/bleeding tendency.  Psychiatric/Behavioral: Negative for sleep disturbance.    PMFS History:  Patient Active Problem List   Diagnosis Date Noted  . Systemic lupus erythematosus (SLE) in adult ([place]) 11/12/2019  . Other proteinuria 11/12/2019  . Vitamin D deficiency 11/12/2019  . History of asthma 11/12/2019  . Seasonal allergies 11/12/2019  . Former smoker 11/12/2019    Past Medical History:  Diagnosis Date  . Systemic lupus erythematosus (HCC)     Family History  Problem Relation Age of Onset  . Hypertension Mother   . Cancer Mother   . Arthritis Father   . Hypertension Other   . Diabetes Other   . Arthritis Other   . Sarcoidosis Paternal Grandmother   . Sarcoidosis Other    Past Surgical History:  Procedure Laterality Date  . RENAL BIOPSY    . WRIST SURGERY     biopsy   Social History   Social History Narrative  . Not on file   Immunization History  Administered Date(s) Administered  . PFIZER SARS-COV-2 Vaccination 12/08/2019, 12/29/2019     Objective: Vital Signs: BP 113/74 (BP Location: Right Arm, Patient Position: Sitting, Cuff Size: Normal)   Pulse 73   Resp 14   Ht 5' 6"  (1.676 m)   Wt 230 lb (104.3 kg)   BMI 37.12 kg/m  Physical Exam Vitals and nursing note reviewed.  Constitutional:      Appearance: She is well-developed.  HENT:     Head: Normocephalic and atraumatic.  Eyes:     Conjunctiva/sclera: Conjunctivae normal.  Cardiovascular:     Rate and Rhythm: Normal rate and regular rhythm.     Heart sounds: Normal heart sounds.  Pulmonary:     Effort: Pulmonary effort is normal.     Breath sounds: Normal breath sounds.  Abdominal:     General: Bowel sounds are normal.     Palpations: Abdomen is  soft.  Musculoskeletal:     Cervical back: Normal range of motion.  Lymphadenopathy:     Cervical: No cervical adenopathy.  Skin:    General: Skin is warm and dry.     Capillary Refill: Capillary refill takes less than 2 seconds.  Neurological:     Mental Status: She is alert and oriented to person, place, and time.  Psychiatric:        Behavior: Behavior normal.      Musculoskeletal Exam: C-spine thoracic and lumbar spine with good range of motion.  Shoulder joints and elbow joints with good range of motion.  She is some tenderness on palpation of her right elbow joints.  Wrist joints, MCPs, PIPs and DIPs with good range of motion with no synovitis.  Hip joints, knee joints, ankles, MTPs and PIPs with good range of motion with no synovitis.  CDAI Exam: CDAI Score: -- Patient Global: --; Provider Global: -- Swollen: --; Tender: -- Joint Exam 03/30/2020   No joint exam has been documented for this visit   There is currently no information documented on the homunculus. Go to the Rheumatology activity and complete the homunculus joint exam.  Investigation: No additional findings.  Imaging: No results found.  Recent Labs: Lab Results  Component Value Date   WBC 3.6 (L) 03/25/2020   HGB 11.1 (L) 03/25/2020   PLT 258 03/25/2020   NA 137 03/25/2020   K 3.9 03/25/2020   CL 108 03/25/2020   CO2 26 03/25/2020   GLUCOSE 78 03/25/2020   BUN 8 03/25/2020   CREATININE 0.71 03/25/2020   BILITOT 0.3 03/25/2020   AST 15 03/25/2020   ALT 6 03/25/2020   PROT 7.4 03/25/2020   CALCIUM 8.7 03/25/2020   GFRAA 128 03/25/2020   QFTBGOLDPLUS NEGATIVE 09/02/2019   February 12, 2020 C3 82 low, C4 normal, dsDNA 1, ESR 38  Speciality Comments: No specialty comments available.  Procedures:  No procedures performed Allergies: Amoxicillin, Paba derivatives, Penicillins, and Sulfa antibiotics   Assessment / Plan:     Visit Diagnoses: Systemic lupus erythematosus (SLE) in adult ([place]) -  Positive ANA, positive Ro, positive Sm, positive RNP, proteinuria, inflammatory arthritis, lupus nephritis.  Patient states that she has been taking Plaquenil on a regular basis since her last visit.  She did much better when she was taking Imuran.  She ran out of Imuran about 2 weeks ago and is having stiffness in her right elbow.  Detailed counseling regarding lupus and medication management was provided.  Have advised her to take her medications on a regular basis.  I'll send a prescription for Imuran 100 mg p.o. daily.  We'll check labs in a month and then every 3 months to monitor for drug toxicity.  She has been also advised to get eye examination on a regular basis.  She has not rescheduled an eye examination yet.  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  Followed by Dr. Joelyn Oms at [facility].  I have advised her to schedule a follow-up appointment with Dr. Joelyn Oms.  Other proteinuria  High risk medication use - Plaquenil 200 mg twice daily, prednisone taper, Imuran 100 mg p.o. daily.  Her labs on March 25, 2020 showed neutropenia otherwise unremarkable.  We'll check labs in a month and then every 3 months.  Contracture of joint of both elbows-resolved on Imuran.  She had tenderness on palpation of her right elbow.  Rash and other nonspecific skin eruption - hyperpigmented lesions on the plantar surface.  Patient did not get biopsy by dermatologist due to financial reasons.  She states she has an appointment coming up in December.  I'm uncertain about the etiology of the rash.  Vitamin D deficiency-patient states she is taking vitamin D supplement.  History of asthma  Seasonal allergies  Former smoker  Educated about COVID-19 virus infection-she is been advised to get a booster.  Use of mask, social distancing and hand hygiene was discussed.  Orders: No orders of the defined  types were placed in this encounter.  Meds ordered this encounter  Medications  . azaTHIOprine (IMURAN) 50 MG tablet    Sig: Take 2 tablets (100 mg total) by mouth daily.    Dispense:  60 tablet    Refill:  2    Follow-Up Instructions: Return in about 6 weeks (around 05/11/2020) for Systemic lupus.   Bo Merino, MD  Note - This record has been created using Editor, commissioning.  Chart creation errors have been sought, but may not always  have been located. Such creation errors do not reflect on  the standard of medical care.

## 2020-04-15 DIAGNOSIS — N898 Other specified noninflammatory disorders of vagina: Secondary | ICD-10-CM | POA: Diagnosis not present

## 2020-04-15 DIAGNOSIS — R102 Pelvic and perineal pain: Secondary | ICD-10-CM | POA: Diagnosis not present

## 2020-04-22 ENCOUNTER — Other Ambulatory Visit: Payer: Self-pay | Admitting: Rheumatology

## 2020-04-27 NOTE — Progress Notes (Signed)
Office Visit Note  Patient: Kristin Murphy             Date of Birth: [birth year]           MRN: [id number]             PCP: [facility] Referring: [facility] Visit Date: 05/11/2020 Occupation: @GUAROCC @  Subjective:  Joint pain.   History of Present Illness: Kristin Murphy is a 34 y.o. female history of systemic lupus and lupus nephritis.  She states she had been doing better on combination therapy.  She has been very has been involved in several activities.  She has been experiencing some discomfort underneath her metatarsals.  She states she was doing really well but recently after the rain her left elbow is locked again.  Activities of Daily Living:  Patient reports morning stiffness for none.   Patient Reports nocturnal pain.  Difficulty dressing/grooming: Denies Difficulty climbing stairs: Denies Difficulty getting out of chair: Reports Difficulty using hands for taps, buttons, cutlery, and/or writing: Reports  Review of Systems  Constitutional: Positive for fatigue. Negative for night sweats, weight gain and weight loss.  HENT: Negative for mouth sores, trouble swallowing, trouble swallowing, mouth dryness and nose dryness.   Eyes: Positive for dryness. Negative for pain, redness and visual disturbance.  Respiratory: Negative for cough, shortness of breath and difficulty breathing.   Cardiovascular: Negative for chest pain, palpitations, hypertension, irregular heartbeat and swelling in legs/feet.  Gastrointestinal: Negative for blood in stool, constipation and diarrhea.  Endocrine: Positive for cold intolerance and heat intolerance. Negative for increased urination.  Genitourinary: Negative for difficulty urinating and vaginal dryness.  Musculoskeletal: Positive for arthralgias, joint pain and muscle tenderness. Negative for gait problem, joint swelling, myalgias, muscle weakness, morning stiffness and myalgias.  Skin: Negative for color  change, rash, hair loss, skin tightness, ulcers and sensitivity to sunlight.  Allergic/Immunologic: Negative for susceptible to infections.  Neurological: Positive for numbness. Negative for dizziness, memory loss, night sweats and weakness.  Hematological: Positive for bruising/bleeding tendency. Negative for swollen glands.  Psychiatric/Behavioral: Negative for depressed mood and sleep disturbance. The patient is not nervous/anxious.     PMFS History:  Patient Active Problem List   Diagnosis Date Noted  . Systemic lupus erythematosus (SLE) in adult (H[facility]) 11/12/2019  . Other proteinuria 11/12/2019  . Vitamin D deficiency 11/12/2019  . History of asthma 11/12/2019  . Seasonal allergies 11/12/2019  . Former smoker 11/12/2019    Past Medical History:  Diagnosis Date  . Systemic lupus erythematosus (HCC)     Family History  Problem Relation Age of Onset  . Hypertension Mother   . Cancer Mother   . Arthritis Father   . Hypertension Other   . Diabetes Other   . Arthritis Other   . Sarcoidosis Paternal Grandmother   . Sarcoidosis Other    Past Surgical History:  Procedure Laterality Date  . RENAL BIOPSY    . WRIST SURGERY     biopsy   Social History   Social History Narrative  . Not on file   Immunization History  Administered Date(s) Administered  . PFIZER SARS-COV-2 Vaccination 12/08/2019, 12/29/2019     Objective: Vital Signs: BP 120/77 (BP Location: Left Arm, Patient Position: Sitting, Cuff Size: Normal)   Pulse 68   Resp 15   Ht 5' 5.5" (1.664 m)   Wt 224 lb (101.6 kg)   BMI 36.71 kg/m    Physical Exam Vitals and nursing note reviewed.  Constitutional:      Appearance: She is well-developed and well-nourished.  HENT:     Head: Normocephalic and atraumatic.  Eyes:     Extraocular Movements: EOM normal.     Conjunctiva/sclera: Conjunctivae normal.  Cardiovascular:     Rate and Rhythm: Normal rate and regular rhythm.     Pulses: Intact distal pulses.      Heart sounds: Normal heart sounds.  Pulmonary:     Effort: Pulmonary effort is normal.     Breath sounds: Normal breath sounds.  Abdominal:     General: Bowel sounds are normal.     Palpations: Abdomen is soft.  Musculoskeletal:     Cervical back: Normal range of motion.  Lymphadenopathy:     Cervical: No cervical adenopathy.  Skin:    General: Skin is warm and dry.     Capillary Refill: Capillary refill takes less than 2 seconds.     Comments: Hyperpigmented macules noted on the bilateral plantar surface.  Neurological:     Mental Status: She is alert and oriented to person, place, and time.  Psychiatric:        Mood and Affect: Mood and affect normal.        Behavior: Behavior normal.      Musculoskeletal Exam: Splint was in good range of motion.  Shoulder joints with good range of motion.  Her left elbow joint has about 50 degree contracture.  Right elbow joint was in good range of motion.  Her wrist joints, MCPs, PIPs and DIPs with good range of motion.  Hip joints, knee joints, ankles, MTPs and PIPs with good range of motion with no synovitis.  CDAI Exam: CDAI Score: -- Patient Global: --; Provider Global: -- Swollen: --; Tender: -- Joint Exam 05/11/2020   No joint exam has been documented for this visit   There is currently no information documented on the homunculus. Go to the Rheumatology activity and complete the homunculus joint exam.  Investigation: No additional findings.  Imaging: No results found.  Recent Labs: Lab Results  Component Value Date   WBC 3.6 (L) 05/10/2020   HGB 11.3 (L) 05/10/2020   PLT 257 05/10/2020   NA 139 05/10/2020   K 3.7 05/10/2020   CL 105 05/10/2020   CO2 27 05/10/2020   GLUCOSE 83 05/10/2020   BUN 8 05/10/2020   CREATININE 0.71 05/10/2020   BILITOT 0.3 05/10/2020   AST 12 05/10/2020   ALT 8 05/10/2020   PROT 7.5 05/10/2020   CALCIUM 9.0 05/10/2020   GFRAA 128 05/10/2020   QFTBGOLDPLUS NEGATIVE 09/02/2019     Speciality Comments: No specialty comments available.  Procedures:  Medium Joint Inj: L elbow on 05/11/2020 8:37 AM Indications: pain Details: 27 G 1.5 in needle, posterior approach Medications: 1 mL lidocaine 1 %; 30 mg triamcinolone acetonide 40 MG/ML Aspirate: 0 mL Outcome: tolerated well, no immediate complications Procedure, treatment alternatives, risks and benefits explained, specific risks discussed. Consent was given by the patient. Immediately prior to procedure a time out was called to verify the correct patient, procedure, equipment, support staff and site/side marked as required. Patient was prepped and draped in the usual sterile fashion.     Allergies: Amoxicillin, Paba derivatives, Penicillins, and Sulfa antibiotics   Assessment / Plan:     Visit Diagnoses: Systemic lupus erythematosus (SLE) in adult (HCC) - Positive ANA, positive Ro, positive Sm, positive RNP, proteinuria, inflammatory arthritis, lupus nephritis -she states that she has been doing much better on the  combination therapy.  She had left elbow joint contracture which she states happened in the last week.  This may indicate a flare.  I will obtain labs today.  She states she has been taking medications on a regular basis.  She has been doing increased activities with the Christmas holidays.  Plan: Urinalysis, Routine w reflex microscopic, Anti-DNA antibody, double-stranded, C3 and C4, Sedimentation rate, Lipid panel, Sedimentation rate, Urinalysis, Routine w reflex microscopic, Anti-DNA antibody, double-stranded, C3 and C4, Lipid panel.  Based on the lab results we will decide if she needs a prednisone taper.  I will contact her after lab results are available.  Lupus nephritis, ISN/RPS class V ([facility]) - Renal biopsy Oct 21, 2019 consistent with membranous lupus nephritis (class V), proteinuria.  Followed by Dr. Marisue Humble at [facility].   Other proteinuria-followed by nephrology.  High risk  medication use - Plaquenil 200 mg twice daily, Imuran 100 mg p.o. daily.   - Plan: Lipid panel, Lipid panel  Contracture of joint of both elbows -her contracture resolved at the last visit.  Now she has recurrence.  After informed consent was obtained left elbow joint was injected with cortisone as described above.  She tolerated the procedure well.  Rash and other nonspecific skin eruption - hyperpigmented lesions on the plantar surface.  She had a skin biopsy which showed postinflammatory changes.  Vitamin D deficiency-her vitamin D was high.  She has been off vitamin D now.  Seasonal allergies  History of asthma-she denies any recent flare.  Former smoker   Orders: Orders Placed This Encounter  Procedures  . Medium Joint Inj  . Urinalysis, Routine w reflex microscopic  . Anti-DNA antibody, double-stranded  . C3 and C4  . Sedimentation rate  . Lipid panel   No orders of the defined types were placed in this encounter.    Follow-Up Instructions: Return in about 3 months (around 08/09/2020) for Systemic lupus.   Pollyann Savoy, MD  Note - This record has been created using Animal nutritionist.  Chart creation errors have been sought, but may not always  have been located. Such creation errors do not reflect on  the standard of medical care.

## 2020-04-29 DIAGNOSIS — L818 Other specified disorders of pigmentation: Secondary | ICD-10-CM | POA: Diagnosis not present

## 2020-04-29 DIAGNOSIS — D485 Neoplasm of uncertain behavior of skin: Secondary | ICD-10-CM | POA: Diagnosis not present

## 2020-05-10 ENCOUNTER — Other Ambulatory Visit: Payer: Self-pay

## 2020-05-10 DIAGNOSIS — Z79899 Other long term (current) drug therapy: Secondary | ICD-10-CM

## 2020-05-11 ENCOUNTER — Ambulatory Visit (INDEPENDENT_AMBULATORY_CARE_PROVIDER_SITE_OTHER): Payer: BC Managed Care – PPO | Admitting: Rheumatology

## 2020-05-11 ENCOUNTER — Other Ambulatory Visit: Payer: Self-pay

## 2020-05-11 ENCOUNTER — Encounter: Payer: Self-pay | Admitting: Rheumatology

## 2020-05-11 VITALS — BP 120/77 | HR 68 | Resp 15 | Ht 65.5 in | Wt 224.0 lb

## 2020-05-11 DIAGNOSIS — M24522 Contracture, left elbow: Secondary | ICD-10-CM | POA: Diagnosis not present

## 2020-05-11 DIAGNOSIS — Z79899 Other long term (current) drug therapy: Secondary | ICD-10-CM

## 2020-05-11 DIAGNOSIS — Z8709 Personal history of other diseases of the respiratory system: Secondary | ICD-10-CM

## 2020-05-11 DIAGNOSIS — E559 Vitamin D deficiency, unspecified: Secondary | ICD-10-CM

## 2020-05-11 DIAGNOSIS — M3214 Glomerular disease in systemic lupus erythematosus: Secondary | ICD-10-CM | POA: Diagnosis not present

## 2020-05-11 DIAGNOSIS — M329 Systemic lupus erythematosus, unspecified: Secondary | ICD-10-CM | POA: Diagnosis not present

## 2020-05-11 DIAGNOSIS — R808 Other proteinuria: Secondary | ICD-10-CM | POA: Diagnosis not present

## 2020-05-11 DIAGNOSIS — R21 Rash and other nonspecific skin eruption: Secondary | ICD-10-CM

## 2020-05-11 DIAGNOSIS — J302 Other seasonal allergic rhinitis: Secondary | ICD-10-CM

## 2020-05-11 DIAGNOSIS — M24521 Contracture, right elbow: Secondary | ICD-10-CM | POA: Diagnosis not present

## 2020-05-11 DIAGNOSIS — Z87891 Personal history of nicotine dependence: Secondary | ICD-10-CM

## 2020-05-11 LAB — COMPLETE METABOLIC PANEL WITH GFR
AG Ratio: 0.8 (calc) — ABNORMAL LOW (ref 1.0–2.5)
ALT: 8 U/L (ref 6–29)
AST: 12 U/L (ref 10–30)
Albumin: 3.3 g/dL — ABNORMAL LOW (ref 3.6–5.1)
Alkaline phosphatase (APISO): 64 U/L (ref 31–125)
BUN: 8 mg/dL (ref 7–25)
CO2: 27 mmol/L (ref 20–32)
Calcium: 9 mg/dL (ref 8.6–10.2)
Chloride: 105 mmol/L (ref 98–110)
Creat: 0.71 mg/dL (ref 0.50–1.10)
GFR, Est African American: 128 mL/min/{1.73_m2} (ref >=60)
GFR, Est Non African American: 110 mL/min/{1.73_m2} (ref >=60)
Globulin: 4.2 g/dL (calc) — ABNORMAL HIGH (ref 1.9–3.7)
Glucose, Bld: 83 mg/dL (ref 65–139)
Potassium: 3.7 mmol/L (ref 3.5–5.3)
Sodium: 139 mmol/L (ref 135–146)
Total Bilirubin: 0.3 mg/dL (ref 0.2–1.2)
Total Protein: 7.5 g/dL (ref 6.1–8.1)

## 2020-05-11 LAB — CBC WITH DIFFERENTIAL/PLATELET
Absolute Monocytes: 630 cells/uL (ref 200–950)
Basophils Absolute: 40 cells/uL (ref 0–200)
Basophils Relative: 1.1 %
Eosinophils Absolute: 202 cells/uL (ref 15–500)
Eosinophils Relative: 5.6 %
HCT: 35 % (ref 35.0–45.0)
Hemoglobin: 11.3 g/dL — ABNORMAL LOW (ref 11.7–15.5)
Lymphs Abs: 1267 cells/uL (ref 850–3900)
MCH: 27.4 pg (ref 27.0–33.0)
MCHC: 32.3 g/dL (ref 32.0–36.0)
MCV: 85 fL (ref 80.0–100.0)
MPV: 13 fL — ABNORMAL HIGH (ref 7.5–12.5)
Monocytes Relative: 17.5 %
Neutro Abs: 1462 cells/uL — ABNORMAL LOW (ref 1500–7800)
Neutrophils Relative %: 40.6 %
Platelets: 257 10*3/uL (ref 140–400)
RBC: 4.12 10*6/uL (ref 3.80–5.10)
RDW: 13.9 % (ref 11.0–15.0)
Total Lymphocyte: 35.2 %
WBC: 3.6 10*3/uL — ABNORMAL LOW (ref 3.8–10.8)

## 2020-05-11 MED ORDER — TRIAMCINOLONE ACETONIDE 40 MG/ML IJ SUSP
30.0000 mg | INTRAMUSCULAR | Status: AC | PRN
  Administered 2020-05-11: 30 mg via INTRA_ARTICULAR

## 2020-05-11 MED ORDER — LIDOCAINE HCL 1 % IJ SOLN
1.0000 mL | INTRAMUSCULAR | Status: AC | PRN
  Administered 2020-05-11: 1 mL

## 2020-05-11 NOTE — Patient Instructions (Signed)
Standing Labs We placed an order today for your standing lab work.   Please have your standing labs drawn in March and every 47months  If possible, please have your labs drawn 2 weeks prior to your appointment so that the provider can discuss your results at your appointment.  We have open lab daily Monday through Thursday from 8:30-12:30 PM and 1:30-4:30 PM and Friday from 8:30-12:30 PM and 1:30-4:00 PM at the office of Dr. Pollyann Savoy, [facility].   Please be advised, patients with office appointments requiring lab work will take precedents over walk-in lab work.  If possible, please come for your lab work on Monday and Friday afternoons, as you may experience shorter wait times. The office is located at [address], [address], [place], [place] [address] No appointment is necessary.   Labs are drawn by [facility]. Please bring your co-pay at the time of your lab draw.  You may receive a bill from [facility] for your lab work.  If you wish to have your labs drawn at another location, please call the office 24 hours in advance to send orders.  If you have any questions regarding directions or hours of operation,  please call [phone].   As a reminder, please drink plenty of water prior to coming for your lab work. Thanks!   Heart Disease Prevention   Your inflammatory disease increases your risk of heart disease which includes heart attack, stroke, atrial fibrillation (irregular heartbeats), high blood pressure, heart failure and atherosclerosis (plaque in the arteries).  It is important to reduce your risk by:   . Keep blood pressure, cholesterol, and blood sugar at healthy levels   . Smoking Cessation   . Maintain a healthy weight  o BMI 20-25   . Eat a healthy diet  o Plenty of fresh fruit, vegetables, and whole grains  o Limit saturated fats, foods high in sodium, and added sugars  o DASH and Mediterranean diet   . Increase physical activity  o Recommend  moderate physically activity for 150 minutes per week/ 30 minutes a day for five days a week These can be broken up into three separate ten-minute sessions during the day.   . Reduce Stress  . Meditation, slow breathing exercises, yoga, coloring books  . Dental visits twice a year    Vaccines You are taking a medication(s) that can suppress your immune system.  The following immunizations are recommended: . Flu annually . Covid-19  . Pneumonia (Pneumovax 23 and Prevnar 13 spaced at least 1 year apart) . Shingrix (after age 57)  Please check with your PCP to make sure you are up to date.

## 2020-05-12 NOTE — Progress Notes (Signed)
Sed rate is still elevated.  UA shows 1+ protein and few bacteria but no WBCs.  Complements are normal.  LDL is normal.  Double-stranded DNA is pending.

## 2020-05-13 LAB — URINALYSIS, ROUTINE W REFLEX MICROSCOPIC
Bilirubin Urine: NEGATIVE
Glucose, UA: NEGATIVE
Hgb urine dipstick: NEGATIVE
Hyaline Cast: NONE SEEN /LPF
Ketones, ur: NEGATIVE
Leukocytes,Ua: NEGATIVE
Nitrite: NEGATIVE
Specific Gravity, Urine: 1.02 (ref 1.001–1.03)
pH: 6.5 (ref 5.0–8.0)

## 2020-05-13 LAB — LIPID PANEL
Cholesterol: 155 mg/dL (ref <200)
HDL: 45 mg/dL — ABNORMAL LOW (ref >=50)
LDL Cholesterol (Calc): 97 mg/dL (calc)
Non-HDL Cholesterol (Calc): 110 mg/dL (calc) (ref <130)
Total CHOL/HDL Ratio: 3.4 (calc) (ref <5.0)
Triglycerides: 50 mg/dL (ref <150)

## 2020-05-13 LAB — SEDIMENTATION RATE: Sed Rate: 38 mm/h — ABNORMAL HIGH (ref 0–20)

## 2020-05-13 LAB — C3 AND C4
C3 Complement: 92 mg/dL (ref 83–193)
C4 Complement: 24 mg/dL (ref 15–57)

## 2020-05-13 LAB — ANTI-DNA ANTIBODY, DOUBLE-STRANDED: ds DNA Ab: 1 IU/mL

## 2020-05-13 NOTE — Progress Notes (Signed)
dsDNA is normal.  Labs are stable.  No change in therapy advised.

## 2020-05-29 HISTORY — PX: KNEE ARTHROSCOPY: SUR90

## 2020-06-01 DIAGNOSIS — Z20828 Contact with and (suspected) exposure to other viral communicable diseases: Secondary | ICD-10-CM | POA: Diagnosis not present

## 2020-06-07 DIAGNOSIS — M3214 Glomerular disease in systemic lupus erythematosus: Secondary | ICD-10-CM | POA: Diagnosis not present

## 2020-06-07 DIAGNOSIS — M329 Systemic lupus erythematosus, unspecified: Secondary | ICD-10-CM | POA: Diagnosis not present

## 2020-06-07 DIAGNOSIS — R319 Hematuria, unspecified: Secondary | ICD-10-CM | POA: Diagnosis not present

## 2020-06-09 ENCOUNTER — Telehealth: Payer: Self-pay | Admitting: *Deleted

## 2020-06-09 NOTE — Telephone Encounter (Signed)
Erorr  

## 2020-06-11 DIAGNOSIS — Z113 Encounter for screening for infections with a predominantly sexual mode of transmission: Secondary | ICD-10-CM | POA: Diagnosis not present

## 2020-06-28 ENCOUNTER — Other Ambulatory Visit: Payer: Self-pay | Admitting: Rheumatology

## 2020-06-28 DIAGNOSIS — M329 Systemic lupus erythematosus, unspecified: Secondary | ICD-10-CM

## 2020-06-28 NOTE — Telephone Encounter (Signed)
Last Visit: 05/11/2020 Next Visit: 08/10/2020 Labs: 05/10/2020, Albumin 3.3, Globulin 4.2, AG Ratio 0.8, WBC 3.6, Hemoglobin 11.3, MPV 13.0, Neutro Abs 1,462 Eye exam: NO EYE EXAM DOCUMENTED, Sue Lush called patient to have PLQ eye exam scheduled  Current Dose per office note 05/11/2020, Plaquenil 200 mg twice daily KB:TCYELYHT lupus erythematosus    Okay to refill Plaquenil?

## 2020-07-01 DIAGNOSIS — Z79899 Other long term (current) drug therapy: Secondary | ICD-10-CM | POA: Diagnosis not present

## 2020-07-06 ENCOUNTER — Telehealth: Payer: Self-pay | Admitting: Rheumatology

## 2020-07-06 NOTE — Telephone Encounter (Signed)
Patient is out of Plaquenil, and cannot afford to pay for rx right now. Patient took last dose Saturday night. Please call to discuss.

## 2020-07-06 NOTE — Telephone Encounter (Signed)
Spoke with patient and she states her dad bought her medication for her today. Patient states she has resumed the PLQ as of today. Patient states she just wanted [place] to be aware that she missed 3 days of her medication. Patient states her co-pay has went from $10 to $31. Patient advised with next refill we can send it to [name] as she can get the prescription for $14.47 for a 1 month supply with a good rx coupon. Patient expressed understanding. Patient states she has had her PLQ eye exam and the results should be sent to [place].

## 2020-07-20 DIAGNOSIS — Z79899 Other long term (current) drug therapy: Secondary | ICD-10-CM | POA: Diagnosis not present

## 2020-07-27 NOTE — Progress Notes (Signed)
Office Visit Note  Patient: Kristin Murphy             Date of Birth: 03-Oct-1985           MRN: [id number]             [facility] Referring: [facility] Visit Date: 08/10/2020 Occupation: @GUAROCC @  Subjective:  Medication management.   History of Present Illness: Kristin Murphy is a 35 y.o. female with history of systemic lupus dermatosis and lupus nephritis.  She states she has been doing quite well on combination of Imuran and Plaquenil.  She states she has been working out and sometimes gets sore towards the end of the workout in her shoulders.  She has not had any rash since her last visit.  She has noted some dark pigmentation on her skin from previous rash especially in the soles of her feet.  Activities of Daily Living:  Patient reports morning stiffness for 0 minutes.   Patient Denies nocturnal pain.  Difficulty dressing/grooming: Denies Difficulty climbing stairs: Denies Difficulty getting out of chair: Denies Difficulty using hands for taps, buttons, cutlery, and/or writing: Denies  Review of Systems  Constitutional: Negative for fatigue.  HENT: Positive for ear ringing. Negative for mouth sores, mouth dryness and nose dryness.   Eyes: Positive for dryness. Negative for pain and itching.  Respiratory: Negative for shortness of breath and difficulty breathing.   Cardiovascular: Negative for chest pain and palpitations.  Gastrointestinal: Negative for blood in stool, constipation and diarrhea.  Endocrine: Negative for increased urination.  Genitourinary: Negative for difficulty urinating.  Musculoskeletal: Positive for myalgias, muscle tenderness and myalgias. Negative for arthralgias, joint pain, joint swelling and morning stiffness.  Skin: Negative for color change, rash and redness.  Allergic/Immunologic: Negative for susceptible to infections.  Neurological: Positive for numbness. Negative for dizziness, headaches, memory loss and  weakness.  Hematological: Positive for bruising/bleeding tendency.  Psychiatric/Behavioral: Negative for confusion.    PMFS History:  Patient Active Problem List   Diagnosis Date Noted   Systemic lupus erythematosus (SLE) in adult (HCC) 11/12/2019   Other proteinuria 11/12/2019   Vitamin D deficiency 11/12/2019   History of asthma 11/12/2019   Seasonal allergies 11/12/2019   Former smoker 11/12/2019    Past Medical History:  Diagnosis Date   Systemic lupus erythematosus (HCC)     Family History  Problem Relation Age of Onset   Hypertension Mother    Cancer Mother    Arthritis Father    Hypertension Other    Diabetes Other    Arthritis Other    Sarcoidosis Paternal Grandmother    Sarcoidosis Other    Past Surgical History:  Procedure Laterality Date   RENAL BIOPSY     WRIST SURGERY     biopsy   Social History   Social History Narrative   Not on file   Immunization History  Administered Date(s) Administered   PFIZER(Purple Top)SARS-COV-2 Vaccination 12/08/2019, 12/29/2019     Objective: Vital Signs: BP 113/73 (BP Location: Right Arm, Patient Position: Sitting, Cuff Size: Normal)    Pulse 72    Resp 14    Ht 5' 5.5" (1.664 m)    Wt 227 lb 12.8 oz (103.3 kg)    BMI 37.33 kg/m    Physical Exam Vitals and nursing note reviewed.  Constitutional:      Appearance: She is well-developed.  HENT:     Head: Normocephalic and atraumatic.  Eyes:     Conjunctiva/sclera:  Conjunctivae normal.  Cardiovascular:     Rate and Rhythm: Normal rate and regular rhythm.     Heart sounds: Normal heart sounds.  Pulmonary:     Effort: Pulmonary effort is normal.     Breath sounds: Normal breath sounds.  Abdominal:     General: Bowel sounds are normal.     Palpations: Abdomen is soft.  Musculoskeletal:     Cervical back: Normal range of motion.  Lymphadenopathy:     Cervical: No cervical adenopathy.  Skin:    General: Skin is warm and dry.     Capillary  Refill: Capillary refill takes less than 2 seconds.     Comments: Hyperpigmentation was noted on her face and postinflammatory hyperpigmentation was noted on the plantar surface of her feet.  Neurological:     Mental Status: She is alert and oriented to person, place, and time.  Psychiatric:        Behavior: Behavior normal.      Musculoskeletal Exam: C-spine thoracic and lumbar spine were in good range of motion.  Shoulder joints, elbow joints, wrist joints, MCPs PIPs and DIPs with good range of motion with no synovitis.  Hip joints, knee joints, ankles, MTPs and PIPs with good range of motion with no synovitis.  CDAI Exam: CDAI Score: -- Patient Global: --; Provider Global: -- Swollen: --; Tender: -- Joint Exam 08/10/2020   No joint exam has been documented for this visit   There is currently no information documented on the homunculus. Go to the Rheumatology activity and complete the homunculus joint exam.  Investigation: No additional findings.  Imaging: No results found.  Recent Labs: Lab Results  Component Value Date   WBC 3.6 (L) 08/06/2020   HGB 11.7 08/06/2020   PLT 251 08/06/2020   NA 139 08/06/2020   K 4.2 08/06/2020   CL 108 08/06/2020   CO2 28 08/06/2020   GLUCOSE 82 08/06/2020   BUN 11 08/06/2020   CREATININE 0.68 08/06/2020   BILITOT 0.3 08/06/2020   AST 12 08/06/2020   ALT 6 08/06/2020   PROT 7.3 08/06/2020   CALCIUM 9.1 08/06/2020   GFRAA 131 08/06/2020   QFTBGOLDPLUS NEGATIVE 09/02/2019    Speciality Comments: PLQ eye exam: 07/20/2020 normal. London Sheer, OD. Follow up in 1 year.  Procedures:  No procedures performed Allergies: Amoxicillin, Paba derivatives, Penicillins, and Sulfa antibiotics   Assessment / Plan:     Visit Diagnoses: Systemic lupus erythematosus (SLE) in adult (HCC) - Positive ANA, positive Ro, positive Sm, positive RNP, proteinuria, inflammatory arthritis, lupus nephritis -patient has no synovitis on my examination today.  She  has not had any recurrence of rash.  Use of sunscreen was emphasized.  Patient states she is allergic to [facility].  Have advised her to schedule an appoint with a dermatologist regarding options.  She is also concerned that she has some hyperpigmentation on her face.  She may discuss that further with the dermatologist.  I would obtain labs prior to her next visit in 3 months.  Plan: Urinalysis, Routine w reflex microscopic, Anti-DNA antibody, double-stranded, C3 and C4, Sedimentation rate  Lupus nephritis, ISN/RPS class V (HCC) - Renal biopsy Oct 21, 2019 consistent with membranous lupus nephritis (class V), proteinuria.  Followed by Dr. Marisue Humble at [facility].  She has an appointment coming up next month.  Other proteinuria - followed by nephrology.  High risk medication use - Plaquenil 200 mg twice daily, Imuran 100 mg p.o. daily. PLQ eye exam: 07/20/2020 -  Plan: CBC with Differential/Platelet, COMPLETE METABOLIC PANEL WITH GFR.  Her eye exam was normal in February 2022.  Contracture of joint of both elbows-resolved.  Rash and other nonspecific skin eruption - hyperpigmented lesions on the plantar surface.  She had a skin biopsy which showed postinflammatory changes.  Palmar wart-she has a small wart on the palm of her right hand.  She has been trying some topical agents.  Have advised her to schedule an appointment with a dermatologist if the lesion does not improve.  Vitamin D deficiency - Plan: VITAMIN D 25 Hydroxy (Vit-D Deficiency, Fractures) she is currently not taking vitamin D.  We will check vitamin D level with her next labs.  Seasonal allergies  History of asthma  Former smoker  Orders: Orders Placed This Encounter  Procedures   CBC with Differential/Platelet   COMPLETE METABOLIC PANEL WITH GFR   Urinalysis, Routine w reflex microscopic   Anti-DNA antibody, double-stranded   C3 and C4   Sedimentation rate   VITAMIN D 25 Hydroxy (Vit-D Deficiency,  Fractures)   No orders of the defined types were placed in this encounter.     Follow-Up Instructions: Return in about 3 months (around 11/10/2020) for Systemic lupus.   Pollyann Savoy, MD  Note - This record has been created using Animal nutritionist.  Chart creation errors have been sought, but may not always  have been located. Such creation errors do not reflect on  the standard of medical care.

## 2020-07-29 ENCOUNTER — Other Ambulatory Visit: Payer: Self-pay | Admitting: Physician Assistant

## 2020-07-29 DIAGNOSIS — M329 Systemic lupus erythematosus, unspecified: Secondary | ICD-10-CM

## 2020-07-29 NOTE — Telephone Encounter (Signed)
Last Visit: 05/11/2020 Next Visit: 08/10/2020 Labs: 05/10/2020, Albumin 3.3, Globulin 4.2, AG Ratio 0.8, WBC 3.6, Hemoglobin 11.3, MPV 13.0, Neutro Abs 1,462,  Eye exam: 07/20/2020  Current Dose per office note 05/11/2020, Plaquenil 200 mg twice daily [id number] lupus erythematosus (SLE) in adult   Last Fill: 06/28/2020  Okay to refill Plaquenil?

## 2020-08-05 ENCOUNTER — Other Ambulatory Visit: Payer: Self-pay | Admitting: *Deleted

## 2020-08-05 ENCOUNTER — Telehealth: Payer: Self-pay

## 2020-08-05 DIAGNOSIS — Z79899 Other long term (current) drug therapy: Secondary | ICD-10-CM

## 2020-08-05 DIAGNOSIS — M329 Systemic lupus erythematosus, unspecified: Secondary | ICD-10-CM

## 2020-08-05 MED ORDER — HYDROXYCHLOROQUINE SULFATE 200 MG PO TABS: 200.0000 mg | ORAL_TABLET | Freq: Two times a day (BID) | ORAL | 0 refills | Status: DC

## 2020-08-05 NOTE — Telephone Encounter (Signed)
Patient called requesting her Plaquenil prescription be cancelled at [facility] and resent to [facility] at [facility] in [place].  Phone #[phone] Fax #[phone] Patient states it is less expensive at [facility].  Patient also states she will be in this afternoon for labwork.

## 2020-08-05 NOTE — Telephone Encounter (Signed)
Cancelled at [facility] and resent PLQ RX to HT at [facility]

## 2020-08-06 DIAGNOSIS — Z79899 Other long term (current) drug therapy: Secondary | ICD-10-CM | POA: Diagnosis not present

## 2020-08-07 LAB — CBC WITH DIFFERENTIAL/PLATELET
Absolute Monocytes: 515 cells/uL (ref 200–950)
Basophils Absolute: 61 cells/uL (ref 0–200)
Basophils Relative: 1.7 %
Eosinophils Absolute: 234 cells/uL (ref 15–500)
Eosinophils Relative: 6.5 %
HCT: 35.8 % (ref 35.0–45.0)
Hemoglobin: 11.7 g/dL (ref 11.7–15.5)
Lymphs Abs: 1699 cells/uL (ref 850–3900)
MCH: 28.5 pg (ref 27.0–33.0)
MCHC: 32.7 g/dL (ref 32.0–36.0)
MCV: 87.1 fL (ref 80.0–100.0)
MPV: 12.9 fL — ABNORMAL HIGH (ref 7.5–12.5)
Monocytes Relative: 14.3 %
Neutro Abs: 1091 cells/uL — ABNORMAL LOW (ref 1500–7800)
Neutrophils Relative %: 30.3 %
Platelets: 251 10*3/uL (ref 140–400)
RBC: 4.11 10*6/uL (ref 3.80–5.10)
RDW: 14.6 % (ref 11.0–15.0)
Total Lymphocyte: 47.2 %
WBC: 3.6 10*3/uL — ABNORMAL LOW (ref 3.8–10.8)

## 2020-08-07 LAB — COMPLETE METABOLIC PANEL WITH GFR
AG Ratio: 1 (calc) (ref 1.0–2.5)
ALT: 6 U/L (ref 6–29)
AST: 12 U/L (ref 10–30)
Albumin: 3.6 g/dL (ref 3.6–5.1)
Alkaline phosphatase (APISO): 57 U/L (ref 31–125)
BUN: 11 mg/dL (ref 7–25)
CO2: 28 mmol/L (ref 20–32)
Calcium: 9.1 mg/dL (ref 8.6–10.2)
Chloride: 108 mmol/L (ref 98–110)
Creat: 0.68 mg/dL (ref 0.50–1.10)
GFR, Est African American: 131 mL/min/{1.73_m2} (ref >=60)
GFR, Est Non African American: 113 mL/min/{1.73_m2} (ref >=60)
Globulin: 3.7 g/dL (calc) (ref 1.9–3.7)
Glucose, Bld: 82 mg/dL (ref 65–99)
Potassium: 4.2 mmol/L (ref 3.5–5.3)
Sodium: 139 mmol/L (ref 135–146)
Total Bilirubin: 0.3 mg/dL (ref 0.2–1.2)
Total Protein: 7.3 g/dL (ref 6.1–8.1)

## 2020-08-08 NOTE — Progress Notes (Signed)
Cell count is low and stable.  CMP is normal.

## 2020-08-10 ENCOUNTER — Ambulatory Visit (INDEPENDENT_AMBULATORY_CARE_PROVIDER_SITE_OTHER): Payer: BC Managed Care – PPO | Admitting: Rheumatology

## 2020-08-10 ENCOUNTER — Encounter: Payer: Self-pay | Admitting: Rheumatology

## 2020-08-10 ENCOUNTER — Other Ambulatory Visit: Payer: Self-pay

## 2020-08-10 VITALS — BP 113/73 | HR 72 | Resp 14 | Ht 65.5 in | Wt 227.8 lb

## 2020-08-10 DIAGNOSIS — Z87891 Personal history of nicotine dependence: Secondary | ICD-10-CM

## 2020-08-10 DIAGNOSIS — Z79899 Other long term (current) drug therapy: Secondary | ICD-10-CM

## 2020-08-10 DIAGNOSIS — R808 Other proteinuria: Secondary | ICD-10-CM | POA: Diagnosis not present

## 2020-08-10 DIAGNOSIS — M3214 Glomerular disease in systemic lupus erythematosus: Secondary | ICD-10-CM | POA: Diagnosis not present

## 2020-08-10 DIAGNOSIS — M24521 Contracture, right elbow: Secondary | ICD-10-CM

## 2020-08-10 DIAGNOSIS — J302 Other seasonal allergic rhinitis: Secondary | ICD-10-CM

## 2020-08-10 DIAGNOSIS — M329 Systemic lupus erythematosus, unspecified: Secondary | ICD-10-CM

## 2020-08-10 DIAGNOSIS — R21 Rash and other nonspecific skin eruption: Secondary | ICD-10-CM

## 2020-08-10 DIAGNOSIS — Z8709 Personal history of other diseases of the respiratory system: Secondary | ICD-10-CM

## 2020-08-10 DIAGNOSIS — E559 Vitamin D deficiency, unspecified: Secondary | ICD-10-CM

## 2020-08-10 DIAGNOSIS — B078 Other viral warts: Secondary | ICD-10-CM

## 2020-08-10 NOTE — Patient Instructions (Addendum)
Standing Labs We placed an order today for your standing lab work.   Please have your standing labs drawn in June and every 3 months  If possible, please have your labs drawn 2 weeks prior to your appointment so that the provider can discuss your results at your appointment.  We have open lab daily Monday through Thursday from 1:30-4:30 PM and Friday from 1:30-4:00 PM at the office of Dr. Pollyann Savoy, [facility].   Please be advised, all patients with office appointments requiring lab work will take precedents over walk-in lab work.  If possible, please come for your lab work on Monday and Friday afternoons, as you may experience shorter wait times. The office is located at [address], [address], [place], [place] [address] No appointment is necessary.   Labs are drawn by [facility]. Please bring your co-pay at the time of your lab draw.  You may receive a bill from [facility] for your lab work.  If you wish to have your labs drawn at another location, please call the office 24 hours in advance to send orders.  If you have any questions regarding directions or hours of operation,  please call [phone].   As a reminder, please drink plenty of water prior to coming for your lab work. Thanks!  COVID-19 vaccine recommendations:   COVID-19 vaccine is recommended for everyone (unless you are allergic to a vaccine component), even if you are on a medication that suppresses your immune system.   Recommendations are that patients on immunosuppressive agents should get first 3 doses of COVID-19 vaccination 1 month apart and then a fourth dose (booster) 6 months after the third dose.  Do not take Tylenol or any anti-inflammatory medications (NSAIDs) 24 hours prior to the COVID-19 vaccination.   There is no direct evidence about the efficacy of the COVID-19 vaccine in individuals who are on medications that suppress the immune system.   Even if you are fully vaccinated, and you  are on any medications that suppress your immune system, please continue to wear a mask, maintain at least six feet social distance and practice hand hygiene.   If you develop a COVID-19 infection, please contact your PCP or our office to determine if you need monoclonal antibody infusion.  The booster vaccine is now available for immunocompromised patients.   Please see the following web sites for updated information.   [email or web address]

## 2020-08-16 ENCOUNTER — Other Ambulatory Visit: Payer: Self-pay | Admitting: Rheumatology

## 2020-08-17 NOTE — Telephone Encounter (Signed)
Next Visit: 11/10/2020  Last Visit: 08/10/2020  Last Fill: 04/23/2020  DX: Systemic lupus erythematosus   Current Dose per office note 08/10/2020, Imuran 100 mg p.o  Labs: 08/06/2020, Cell count is low and stable. CMP is normal.  Okay to refill Imuran?

## 2020-08-30 DIAGNOSIS — M3214 Glomerular disease in systemic lupus erythematosus: Secondary | ICD-10-CM | POA: Diagnosis not present

## 2020-08-31 DIAGNOSIS — M3214 Glomerular disease in systemic lupus erythematosus: Secondary | ICD-10-CM | POA: Diagnosis not present

## 2020-09-06 DIAGNOSIS — M3214 Glomerular disease in systemic lupus erythematosus: Secondary | ICD-10-CM | POA: Diagnosis not present

## 2020-09-06 DIAGNOSIS — R319 Hematuria, unspecified: Secondary | ICD-10-CM | POA: Diagnosis not present

## 2020-09-06 DIAGNOSIS — M329 Systemic lupus erythematosus, unspecified: Secondary | ICD-10-CM | POA: Diagnosis not present

## 2020-09-08 ENCOUNTER — Telehealth: Payer: Self-pay | Admitting: *Deleted

## 2020-09-08 NOTE — Telephone Encounter (Signed)
Labs received from:  [facility] on: 08/30/2020 Reviewed by: Sherron Ales, PA-C  Labs drawn: Renal Function, PR/CR Ratio  Results: Protein 215 Alb/Creatinine Ratio 51

## 2020-10-18 ENCOUNTER — Telehealth: Payer: Self-pay | Admitting: Rheumatology

## 2020-10-18 NOTE — Telephone Encounter (Signed)
Patient has noticed some stiffness, hard to bend knee at times, and tightness with knee. Patient noticed symptoms starting last week. Patient has been exercising, and taking medication as instructed. Patient would like to know if she needs to take Ibuprofen to help? If icing would help? Please call to discuss.

## 2020-10-18 NOTE — Telephone Encounter (Signed)
I called patient, patient is not having pain, patient cannot bend right knee, patient has almost fallen a couple of times, knee is giving out and feels like the knee cap is popping out of place, patient is wearing your brace, elevating knee, tightness and swelling x 3 days, knee is not warm to the touch, patient does not have an orthopedic doctor. Please advise.

## 2020-10-19 ENCOUNTER — Other Ambulatory Visit: Payer: Self-pay | Admitting: *Deleted

## 2020-10-19 DIAGNOSIS — M25461 Effusion, right knee: Secondary | ICD-10-CM

## 2020-10-19 NOTE — Telephone Encounter (Signed)
Please schedule an appointment with orthopedics. ° °

## 2020-10-19 NOTE — Telephone Encounter (Signed)
LMOM, referral for orthopedic appt placed.

## 2020-10-27 NOTE — Progress Notes (Signed)
Office Visit Note  Patient: Kristin Murphy             Date of Birth: 1985-09-17           MRN: [id number]             [facility] Referring: [facility] Visit Date: 11/10/2020 Occupation: @GUAROCC @  Subjective:  Follow-up (Patient recently saw Dr. recently for right knee pain and swelling. )   History of Present Illness: Kristin Murphy is a 35 y.o. female history of systemic lupus dermatosis.  She states about a month ago she noticed increased pain and swelling in her knee joint after working out.  And the symptoms gradually got worse.  She was seen by Dr. 31 on November 03, 2020.  He evaluated the patient and schedule MRI of her right knee joint to rule out meniscal tear.  She continues to have swelling in her right knee joint and has difficulty walking.  None of the other joints are painful or swollen.  Activities of Daily Living:  Patient reports morning stiffness for 0 minutes.   Patient Denies nocturnal pain.  Difficulty dressing/grooming: Denies Difficulty climbing stairs: Reports Difficulty getting out of chair: Reports Difficulty using hands for taps, buttons, cutlery, and/or writing: Denies  Review of Systems  Constitutional:  Negative for fatigue.  HENT:  Negative for mouth sores, mouth dryness and nose dryness.   Eyes:  Positive for dryness. Negative for pain, itching and visual disturbance.  Respiratory:  Negative for cough, hemoptysis, shortness of breath and difficulty breathing.   Cardiovascular:  Negative for chest pain, palpitations and swelling in legs/feet.  Gastrointestinal:  Negative for abdominal pain, blood in stool, constipation and diarrhea.  Endocrine: Negative for increased urination.  Genitourinary:  Negative for painful urination.  Musculoskeletal:  Positive for joint pain, joint pain, joint swelling and muscle tenderness. Negative for myalgias, muscle weakness, morning stiffness and myalgias.  Skin:  Negative for  color change, rash and redness.  Allergic/Immunologic: Negative for susceptible to infections.  Neurological:  Positive for numbness. Negative for dizziness, headaches, memory loss and weakness.  Hematological:  Positive for bruising/bleeding tendency. Negative for swollen glands.  Psychiatric/Behavioral:  Negative for confusion and sleep disturbance.    PMFS History:  Patient Active Problem List   Diagnosis Date Noted   Lupus nephritis, ISN/RPS class V (HCC) 08/10/2020   Systemic lupus erythematosus (SLE) in adult (HCC) 11/12/2019   Other proteinuria 11/12/2019   Vitamin D deficiency 11/12/2019   History of asthma 11/12/2019   Seasonal allergies 11/12/2019   Former smoker 11/12/2019    Past Medical History:  Diagnosis Date   Systemic lupus erythematosus (HCC)     Family History  Problem Relation Age of Onset   Hypertension Mother    Cancer Mother    Arthritis Father    Hypertension Other    Diabetes Other    Arthritis Other    Sarcoidosis Paternal Grandmother    Sarcoidosis Other    Past Surgical History:  Procedure Laterality Date   RENAL BIOPSY     WRIST SURGERY     biopsy   Social History   Social History Narrative   Not on file   Immunization History  Administered Date(s) Administered   PFIZER(Purple Top)SARS-COV-2 Vaccination 12/08/2019, 12/29/2019     Objective: Vital Signs: BP 117/79 (BP Location: Left Arm, Patient Position: Sitting, Cuff Size: Large)   Pulse 75   Ht 5\' 6"  (1.676 m)   Wt  228 lb (103.4 kg)   BMI 36.80 kg/m    Physical Exam Vitals and nursing note reviewed.  Constitutional:      Appearance: She is well-developed.  HENT:     Head: Normocephalic and atraumatic.  Eyes:     Conjunctiva/sclera: Conjunctivae normal.  Cardiovascular:     Rate and Rhythm: Normal rate and regular rhythm.     Heart sounds: Normal heart sounds.  Pulmonary:     Effort: Pulmonary effort is normal.     Breath sounds: Normal breath sounds.  Abdominal:      General: Bowel sounds are normal.     Palpations: Abdomen is soft.  Musculoskeletal:     Cervical back: Normal range of motion.  Lymphadenopathy:     Cervical: No cervical adenopathy.  Skin:    General: Skin is warm and dry.     Capillary Refill: Capillary refill takes less than 2 seconds.  Neurological:     Mental Status: She is alert and oriented to person, place, and time.  Psychiatric:        Behavior: Behavior normal.     Musculoskeletal Exam: C-spine was in good range of motion.  Shoulder joints, elbow joints, wrist joints, MCPs PIPs with good range of motion with no synovitis.  Hip joints and knee joints in good range of motion.  She had right knee joint warmth and swelling without any effusion.  Ankle joints, MTPs with good range of motion with no synovitis.  CDAI Exam: CDAI Score: -- Patient Global: --; Provider Global: -- Swollen: --; Tender: -- Joint Exam 11/10/2020   No joint exam has been documented for this visit   There is currently no information documented on the homunculus. Go to the Rheumatology activity and complete the homunculus joint exam.  Investigation: No additional findings.  Imaging: XR KNEE 3 VIEW RIGHT  Result Date: 11/05/2020 AP lateral merchant radiographs right knee reviewed.  Joint space maintained.  Alignment intact.  No acute fracture.  No significant degenerative changes present.   Recent Labs: Lab Results  Component Value Date   WBC 3.6 (L) 08/06/2020   HGB 11.7 08/06/2020   PLT 251 08/06/2020   NA 139 08/06/2020   K 4.2 08/06/2020   CL 108 08/06/2020   CO2 28 08/06/2020   GLUCOSE 82 08/06/2020   BUN 11 08/06/2020   CREATININE 0.68 08/06/2020   BILITOT 0.3 08/06/2020   AST 12 08/06/2020   ALT 6 08/06/2020   PROT 7.3 08/06/2020   CALCIUM 9.1 08/06/2020   GFRAA 131 08/06/2020   QFTBGOLDPLUS NEGATIVE 09/02/2019    Speciality Comments: PLQ eye exam: 07/20/2020 normal. London Sheer, OD. Follow up in 1 year.  Procedures:  Large  Joint Inj on 11/10/2020 3:48 PM Indications: pain Details: 27 G 1.5 in needle, medial approach  Arthrogram: No  Medications: 60 mg triamcinolone acetonide 40 MG/ML; 1.5 mL lidocaine 1 % Aspirate: 0 mL Outcome: tolerated well, no immediate complications Procedure, treatment alternatives, risks and benefits explained, specific risks discussed. Consent was given by the patient. Immediately prior to procedure a time out was called to verify the correct patient, procedure, equipment, support staff and site/side marked as required. Patient was prepped and draped in the usual sterile fashion.    Allergies: Amoxicillin, Paba derivatives, Penicillins, and Sulfa antibiotics   Assessment / Plan:     Visit Diagnoses: Systemic lupus erythematosus (SLE) in adult (HCC) - Positive ANA, positive Ro, positive Sm, positive RNP, proteinuria, inflammatory arthritis, lupus nephritis -patient will come  in tomorrow for labs.  Besides right knee joint swelling she does not have any other symptoms.  Plan: Protein / creatinine ratio, urine, Anti-DNA antibody, double-stranded, C3 and C4, Sedimentation rate tomorrow.  If her WBC count is a stable I may increase her Imuran dose 250 mg p.o. daily to control her disease process.  Lupus nephritis, ISN/RPS class V (HCC) - Renal biopsy Oct 21, 2019 consistent with membranous lupus nephritis (class V), proteinuria.  Followed by Dr. Marisue Humble at [facility].  Other proteinuria - followed by nephrology.  High risk medication use - Plaquenil 200 mg twice daily, Imuran 100 mg p.o. daily. PLQ eye exam: 07/20/2020 - Plan: CBC with Differential/Platelet, COMPLETE METABOLIC PANEL WITH GFR tomorrow and then every 3 months to monitor for drug toxicity.  Contracture of joint of both elbows - resolved.  Chronic pain of right knee-she has been having increased swelling in her right knee joint and tightness.  She states she has been very active and has been working out on a  regular basis.  She also has been dancing.  She was evaluated by Dr. August Saucer.  She states MRI is pending.  She is in a lot of discomfort.  She would like to have cortisone injection.  After informed consent and side effects were discussed right knee joint was injected with cortisone as described above.  She tolerated the procedure well.  Rash and other nonspecific skin eruption - hyperpigmented lesions on the plantar surface.  She had a skin biopsy which showed postinflammatory changes.  Palmar wart - she has a small wart on the palm of her right hand.    Vitamin D deficiency - Plan: VITAMIN D 25 Hydroxy (Vit-D Deficiency, Fractures)  Seasonal allergies  History of asthma  Former smoker  Orders: Orders Placed This Encounter  Procedures   Large Joint Inj   Protein / creatinine ratio, urine   CBC with Differential/Platelet   COMPLETE METABOLIC PANEL WITH GFR   Anti-DNA antibody, double-stranded   C3 and C4   Sedimentation rate   VITAMIN D 25 Hydroxy (Vit-D Deficiency, Fractures)    No orders of the defined types were placed in this encounter.    Follow-Up Instructions: Return in about 2 months (around 01/10/2021) for Systemic lupus.   Pollyann Savoy, MD  Note - This record has been created using Animal nutritionist.  Chart creation errors have been sought, but may not always  have been located. Such creation errors do not reflect on  the standard of medical care.

## 2020-10-29 ENCOUNTER — Telehealth: Payer: Self-pay

## 2020-10-29 NOTE — Telephone Encounter (Signed)
Patient was provided with a temporary 6 month handicap placard on 10/06/2019.   Dx: Systemic lupus erythematosus, Lupus nephritis, ISN/RPS class V  Okay to renew handicap placard?

## 2020-10-29 NOTE — Telephone Encounter (Signed)
Patient called stating her handicap placard has expired and is requesting to stop by the office to pick up a new one.

## 2020-10-29 NOTE — Telephone Encounter (Signed)
ok 

## 2020-10-29 NOTE — Telephone Encounter (Signed)
Handicap placard form is at the front desk for pick up. I advised patient and she verbalized understanding.

## 2020-11-03 ENCOUNTER — Ambulatory Visit: Payer: BC Managed Care – PPO | Admitting: Orthopedic Surgery

## 2020-11-03 ENCOUNTER — Ambulatory Visit: Payer: Self-pay

## 2020-11-03 VITALS — Ht 66.0 in | Wt 231.0 lb

## 2020-11-03 DIAGNOSIS — M25561 Pain in right knee: Secondary | ICD-10-CM

## 2020-11-05 ENCOUNTER — Encounter: Payer: Self-pay | Admitting: Orthopedic Surgery

## 2020-11-05 NOTE — Progress Notes (Signed)
Office Visit Note   Patient: Kristin Murphy           Date of Birth: 1985-09-14           MRN: [id number] Visit Date: 11/03/2020 Requested by: Pollyann Savoy, MD [address] [place],  [place] [address] PCP: [facility]  Subjective: Chief Complaint  Patient presents with   Right Knee - Pain    HPI: Patient presents for evaluation of right knee pain of 6 weeks duration.  She likes to walk 3 miles a day.  She feels like the knee feels tight and heavy.  Has not been able to exercise.  She has a Psychologist, educational at [employer].  She is working on weight loss.  She has a diagnosis of lupus for which she takes Plaquenil and Imuran.  She likes to jog and walk outside.  She has no history of trauma but does have a history of an injection.  Reports pain going up and down the stairs.  The knee gives out.  Left knee is fine.  She has been doing in-home therapy for leg strengthening but reports persistent symptoms.              ROS: All systems reviewed are negative as they relate to the chief complaint within the history of present illness.  Patient denies  fevers or chills.   Assessment & Plan: Visit Diagnoses:  1. Right knee pain, unspecified chronicity     Plan: Impression is right knee effusion with fairly normal radiographs and normal ligamentous exam.  This could represent synovitis versus occult chondral injury versus meniscal tearing.  Symptoms ongoing for 6 weeks.  Failed conservative management.  Recommend MRI right knee to evaluate possible meniscal tear.  Follow-up after that study.  She does do a lot of African dancing but has not been able to because of the pain.  Follow-Up Instructions: Return for after MRI.   Orders:  Orders Placed This Encounter  Procedures   XR KNEE 3 VIEW RIGHT   MR Knee Right w/o contrast   No orders of the defined types were placed in this encounter.     Procedures: No procedures performed   Clinical Data: No additional  findings.  Objective: Vital Signs: Ht 5\' 6"  (1.676 m)   Wt 231 lb (104.8 kg)   BMI 37.28 kg/m   Physical Exam:   Constitutional: Patient appears well-developed HEENT:  Head: Normocephalic Eyes:EOM are normal Neck: Normal range of motion Cardiovascular: Normal rate Pulmonary/chest: Effort normal Neurologic: Patient is alert Skin: Skin is warm Psychiatric: Patient has normal mood and affect   Ortho Exam: Ortho exam demonstrates right knee moderate effusion with stable collateral cruciate ligaments.  No groin pain with internal ex rotation of the leg.  No left knee effusion is present.  Patient has medial and lateral joint line tenderness with not much patellofemoral crepitus and no apprehension.  Extensor mechanism is intact.  Pedal pulses palpable.  Alignment intact.  Specialty Comments:  No specialty comments available.  Imaging: No results found.   PMFS History: Patient Active Problem List   Diagnosis Date Noted   Lupus nephritis, ISN/RPS class V (HCC) 08/10/2020   Systemic lupus erythematosus (SLE) in adult [facility]) 11/12/2019   Other proteinuria 11/12/2019   Vitamin D deficiency 11/12/2019   History of asthma 11/12/2019   Seasonal allergies 11/12/2019   Former smoker 11/12/2019   Past Medical History:  Diagnosis Date   Systemic lupus erythematosus (HCC)  Family History  Problem Relation Age of Onset   Hypertension Mother    Cancer Mother    Arthritis Father    Hypertension Other    Diabetes Other    Arthritis Other    Sarcoidosis Paternal Grandmother    Sarcoidosis Other     Past Surgical History:  Procedure Laterality Date   RENAL BIOPSY     WRIST SURGERY     biopsy   Social History   Occupational History   Not on file  Tobacco Use   Smoking status: Former    Pack years: 0.00    Types: Cigarettes   Smokeless tobacco: Never   Tobacco comments:    college  Building services engineer Use: Former  Substance and Sexual Activity   Alcohol use: Yes     Comment: rarely   Drug use: Yes    Types: Marijuana   Sexual activity: Not on file

## 2020-11-10 ENCOUNTER — Encounter: Payer: Self-pay | Admitting: Rheumatology

## 2020-11-10 ENCOUNTER — Ambulatory Visit (INDEPENDENT_AMBULATORY_CARE_PROVIDER_SITE_OTHER): Payer: BC Managed Care – PPO | Admitting: Rheumatology

## 2020-11-10 ENCOUNTER — Other Ambulatory Visit: Payer: Self-pay

## 2020-11-10 VITALS — BP 117/79 | HR 75 | Ht 66.0 in | Wt 228.0 lb

## 2020-11-10 DIAGNOSIS — R808 Other proteinuria: Secondary | ICD-10-CM

## 2020-11-10 DIAGNOSIS — M25561 Pain in right knee: Secondary | ICD-10-CM | POA: Diagnosis not present

## 2020-11-10 DIAGNOSIS — G8929 Other chronic pain: Secondary | ICD-10-CM | POA: Diagnosis not present

## 2020-11-10 DIAGNOSIS — E559 Vitamin D deficiency, unspecified: Secondary | ICD-10-CM

## 2020-11-10 DIAGNOSIS — M329 Systemic lupus erythematosus, unspecified: Secondary | ICD-10-CM

## 2020-11-10 DIAGNOSIS — Z8709 Personal history of other diseases of the respiratory system: Secondary | ICD-10-CM

## 2020-11-10 DIAGNOSIS — J302 Other seasonal allergic rhinitis: Secondary | ICD-10-CM

## 2020-11-10 DIAGNOSIS — R21 Rash and other nonspecific skin eruption: Secondary | ICD-10-CM

## 2020-11-10 DIAGNOSIS — M3214 Glomerular disease in systemic lupus erythematosus: Secondary | ICD-10-CM | POA: Diagnosis not present

## 2020-11-10 DIAGNOSIS — Z87891 Personal history of nicotine dependence: Secondary | ICD-10-CM

## 2020-11-10 DIAGNOSIS — M24522 Contracture, left elbow: Secondary | ICD-10-CM

## 2020-11-10 DIAGNOSIS — Z79899 Other long term (current) drug therapy: Secondary | ICD-10-CM

## 2020-11-10 DIAGNOSIS — B078 Other viral warts: Secondary | ICD-10-CM

## 2020-11-10 DIAGNOSIS — M24521 Contracture, right elbow: Secondary | ICD-10-CM

## 2020-11-10 MED ORDER — LIDOCAINE HCL 1 % IJ SOLN
1.5000 mL | INTRAMUSCULAR | Status: AC | PRN
  Administered 2020-11-10: 1.5 mL

## 2020-11-10 MED ORDER — TRIAMCINOLONE ACETONIDE 40 MG/ML IJ SUSP
60.0000 mg | INTRAMUSCULAR | Status: AC | PRN
  Administered 2020-11-10: 60 mg via INTRA_ARTICULAR

## 2020-11-10 NOTE — Patient Instructions (Signed)
Standing Labs We placed an order today for your standing lab work.   Please have your standing labs drawn in September and every 3 months  If possible, please have your labs drawn 2 weeks prior to your appointment so that the provider can discuss your results at your appointment.  Please note that you may see your imaging and lab results in MyChart before we have reviewed them. We may be awaiting multiple results to interpret others before contacting you. Please allow our office up to 72 hours to thoroughly review all of the results before contacting the office for clarification of your results.  We have open lab daily: Monday through Thursday from 1:30-4:30 PM and Friday from 1:30-4:00 PM at the office of Dr. Sailor Haughn, [facility].   Please be advised, all patients with office appointments requiring lab work will take precedent over walk-in lab work.  If possible, please come for your lab work on Monday and Friday afternoons, as you may experience shorter wait times. The office is located at [address], [address], , [place] [address] No appointment is necessary.   Labs are drawn by [facility]. Please bring your co-pay at the time of your lab draw.  You may receive a bill from [facility] for your lab work.  If you wish to have your labs drawn at another location, please call the office 24 hours in advance to send orders.  If you have any questions regarding directions or hours of operation,  please call [phone].   As a reminder, please drink plenty of water prior to coming for your lab work. Thanks! 

## 2020-11-12 ENCOUNTER — Other Ambulatory Visit: Payer: Self-pay

## 2020-11-12 ENCOUNTER — Telehealth: Payer: Self-pay | Admitting: Rheumatology

## 2020-11-12 DIAGNOSIS — E559 Vitamin D deficiency, unspecified: Secondary | ICD-10-CM

## 2020-11-12 DIAGNOSIS — Z79899 Other long term (current) drug therapy: Secondary | ICD-10-CM | POA: Diagnosis not present

## 2020-11-12 DIAGNOSIS — M329 Systemic lupus erythematosus, unspecified: Secondary | ICD-10-CM

## 2020-11-12 NOTE — Telephone Encounter (Signed)
Patient requesting a note for work to allow her to work from home. Patient states doctor told her to "take it easy" at work the last time she was here. Work is allowing her to work from home, but patient would like a note to support that. Please call to advise.

## 2020-11-12 NOTE — Telephone Encounter (Signed)
Per office note on 11/10/2020:  Chronic pain of right knee-she has been having increased swelling in her right knee joint and tightness.  She states she has been very active and has been working out on a regular basis.  She also has been dancing.  She was evaluated by Dr. August Saucer.  She states MRI is pending.  She is in a lot of discomfort.  She would like to have cortisone injection.  After informed consent and side effects were discussed right knee joint was injected with cortisone as described above.  She tolerated the procedure well.  Patient states she is "hobbling" around due to the knee pain and is requesting a note to work from home. Should patient contact Dr. August Saucer for a work note since the MRI he ordered is pending or would you like to provide a work note? Please advise.

## 2020-11-15 ENCOUNTER — Encounter: Payer: Self-pay | Admitting: *Deleted

## 2020-11-15 ENCOUNTER — Other Ambulatory Visit: Payer: Self-pay | Admitting: *Deleted

## 2020-11-15 DIAGNOSIS — Z79899 Other long term (current) drug therapy: Secondary | ICD-10-CM

## 2020-11-15 LAB — CBC WITH DIFFERENTIAL/PLATELET
Absolute Monocytes: 381 cells/uL (ref 200–950)
Basophils Absolute: 30 cells/uL (ref 0–200)
Basophils Relative: 0.8 %
Eosinophils Absolute: 70 cells/uL (ref 15–500)
Eosinophils Relative: 1.9 %
HCT: 40.9 % (ref 35.0–45.0)
Hemoglobin: 13.3 g/dL (ref 11.7–15.5)
Lymphs Abs: 1425 cells/uL (ref 850–3900)
MCH: 28.9 pg (ref 27.0–33.0)
MCHC: 32.5 g/dL (ref 32.0–36.0)
MCV: 88.7 fL (ref 80.0–100.0)
MPV: 13 fL — ABNORMAL HIGH (ref 7.5–12.5)
Monocytes Relative: 10.3 %
Neutro Abs: 1795 cells/uL (ref 1500–7800)
Neutrophils Relative %: 48.5 %
Platelets: 222 10*3/uL (ref 140–400)
RBC: 4.61 10*6/uL (ref 3.80–5.10)
RDW: 13.5 % (ref 11.0–15.0)
Total Lymphocyte: 38.5 %
WBC: 3.7 10*3/uL — ABNORMAL LOW (ref 3.8–10.8)

## 2020-11-15 LAB — COMPLETE METABOLIC PANEL WITH GFR
AG Ratio: 1 (calc) (ref 1.0–2.5)
ALT: 6 U/L (ref 6–29)
AST: 15 U/L (ref 10–30)
Albumin: 3.9 g/dL (ref 3.6–5.1)
Alkaline phosphatase (APISO): 61 U/L (ref 31–125)
BUN: 13 mg/dL (ref 7–25)
CO2: 25 mmol/L (ref 20–32)
Calcium: 9.5 mg/dL (ref 8.6–10.2)
Chloride: 105 mmol/L (ref 98–110)
Creat: 0.76 mg/dL (ref 0.50–1.10)
GFR, Est African American: 118 mL/min/{1.73_m2} (ref >=60)
GFR, Est Non African American: 102 mL/min/{1.73_m2} (ref >=60)
Globulin: 4.1 g/dL (calc) — ABNORMAL HIGH (ref 1.9–3.7)
Glucose, Bld: 88 mg/dL (ref 65–99)
Potassium: 3.7 mmol/L (ref 3.5–5.3)
Sodium: 138 mmol/L (ref 135–146)
Total Bilirubin: 0.4 mg/dL (ref 0.2–1.2)
Total Protein: 8 g/dL (ref 6.1–8.1)

## 2020-11-15 LAB — SEDIMENTATION RATE: Sed Rate: 45 mm/h — ABNORMAL HIGH (ref 0–20)

## 2020-11-15 LAB — PROTEIN / CREATININE RATIO, URINE
Creatinine, Urine: 125 mg/dL (ref 20–275)
Protein/Creat Ratio: 1200 mg/g creat — ABNORMAL HIGH (ref 21–161)
Protein/Creatinine Ratio: 1.2 mg/mg creat — ABNORMAL HIGH (ref 0.021–0.161)
Total Protein, Urine: 150 mg/dL — ABNORMAL HIGH (ref 5–24)

## 2020-11-15 LAB — ANTI-DNA ANTIBODY, DOUBLE-STRANDED: ds DNA Ab: <1 IU/mL

## 2020-11-15 LAB — C3 AND C4
C3 Complement: 105 mg/dL (ref 83–193)
C4 Complement: 25 mg/dL (ref 15–57)

## 2020-11-15 LAB — VITAMIN D 25 HYDROXY (VIT D DEFICIENCY, FRACTURES): Vit D, 25-Hydroxy: 45 ng/mL (ref 30–100)

## 2020-11-15 MED ORDER — AZATHIOPRINE 50 MG PO TABS: 150.0000 mg | ORAL_TABLET | Freq: Every day | ORAL | 0 refills | Status: DC

## 2020-11-15 NOTE — Telephone Encounter (Signed)
Okay to give a note to work from home for 1 week.

## 2020-11-15 NOTE — Telephone Encounter (Signed)
Note written and faxed. Fax number [phone] Attn. [employer].

## 2020-11-15 NOTE — Telephone Encounter (Signed)
-----   Message from Pollyann Savoy, MD sent at 11/15/2020  8:08 AM EDT ----- Protein creatinine ratio is high.  Please forward labs to her nephrologist and advised patient to schedule a follow-up appointment with the nephrology.  White blood cell count is low due to immunosuppression .CMP is normal, sed rate is elevated at 45 .  vitamin D is normal.  anti-DNA is pending.  Please advise patient to increase Imuran 50 milligrams tablets, 3 tablets p.o. daily due to elevated sed rate and ongoing inflammation in her knee.  She should have repeat CBC and CMP in 2 weeks and then  2 months.

## 2020-11-15 NOTE — Progress Notes (Signed)
Protein creatinine ratio is high.  Please forward labs to her nephrologist and advised patient to schedule a follow-up appointment with the nephrology.  White blood cell count is low due to immunosuppression .CMP is normal, sed rate is elevated at 45.  vitamin D is normal.  anti-DNA is pending.  Please advise patient to increase Imuran 50 milligrams tablets, 3 tablets p.o. daily due to elevated sed rate and ongoing inflammation in her knee.  She should have repeat CBC and CMP in 2 weeks and then 2 months.

## 2020-11-18 ENCOUNTER — Telehealth: Payer: Self-pay | Admitting: Orthopedic Surgery

## 2020-11-18 NOTE — Telephone Encounter (Signed)
Called patient to set up an appointment for MRI review with Dr August Saucer. Patient said she did not have her calendar with her and she will call back after her MRI on Saturday to schedule the appointment.

## 2020-11-20 ENCOUNTER — Ambulatory Visit
Admission: RE | Admit: 2020-11-20 | Discharge: 2020-11-20 | Disposition: A | Discharge status: Home / Self Care | Payer: BC Managed Care – PPO | Source: Ambulatory Visit | Attending: Orthopedic Surgery | Admitting: Orthopedic Surgery

## 2020-11-20 ENCOUNTER — Other Ambulatory Visit: Payer: Self-pay

## 2020-11-20 DIAGNOSIS — M25561 Pain in right knee: Secondary | ICD-10-CM

## 2020-11-25 ENCOUNTER — Other Ambulatory Visit: Payer: Self-pay | Admitting: *Deleted

## 2020-11-25 DIAGNOSIS — M329 Systemic lupus erythematosus, unspecified: Secondary | ICD-10-CM

## 2020-11-25 MED ORDER — HYDROXYCHLOROQUINE SULFATE 200 MG PO TABS: 200.0000 mg | ORAL_TABLET | Freq: Two times a day (BID) | ORAL | 0 refills | Status: DC

## 2020-11-25 NOTE — Telephone Encounter (Signed)
Last Visit: 11/10/2020  Next Visit: 01/11/2021  Labs: 11/12/2020 White blood cell count is low due to immunosuppression .CMP is normal,  Eye exam: 07/20/2020 normal.   Current Dose per office note 11/10/2020: Plaquenil 200 mg twice daily UD:THYHOOIL lupus erythematosus (SLE) in adult [facility]  Last Fill: 08/05/2020  Okay to refill Plaquenil?

## 2020-11-25 NOTE — Telephone Encounter (Signed)
Patient needs RF of PL[facility], [name], [place], [place]

## 2020-11-25 NOTE — Addendum Note (Signed)
Addended by: Henriette Combs on: 11/25/2020 12:56 PM   Modules accepted: Orders

## 2020-12-01 NOTE — Progress Notes (Signed)
Tried calling this patient about her MRI results.  Nothing clearly definitively operative in the knee at this time.  Tried to leave a message but her mailbox was full.  Although she has a return appointment.

## 2020-12-03 DIAGNOSIS — M329 Systemic lupus erythematosus, unspecified: Secondary | ICD-10-CM | POA: Diagnosis not present

## 2020-12-06 ENCOUNTER — Ambulatory Visit (INDEPENDENT_AMBULATORY_CARE_PROVIDER_SITE_OTHER): Payer: BC Managed Care – PPO | Admitting: Surgical

## 2020-12-06 ENCOUNTER — Encounter: Payer: Self-pay | Admitting: Surgical

## 2020-12-06 ENCOUNTER — Other Ambulatory Visit: Payer: Self-pay

## 2020-12-06 DIAGNOSIS — M25461 Effusion, right knee: Secondary | ICD-10-CM

## 2020-12-06 NOTE — Progress Notes (Signed)
Office Visit Note   Patient: Kristin Murphy           Date of Birth: [birth year]           MRN: [id number] Visit Date: 12/06/2020 Requested by: [facility] [address] [place],  [place] [address] PCP: [facility]  Subjective: Chief Complaint  Patient presents with   Other    MRI review    HPI: Kristin Murphy is a 35 y.o. female who presents to the office complaining of right knee swelling.  She is here to review right knee MRI scan.  She was seen by Dr. August Saucer several weeks ago with right knee effusion and stiffness.  She states the symptoms came on due to her workout regimen which involved fast walking, jogging, sprinting, lunges/squatting.  Since her last appointment with Dr. August Saucer, she has stopped all of her physical activity aside from abdominal exercises and elliptical.  She does have history of SLE and sees Dr Corliss Skains.  She had injection by Dr Corliss Skains on 11/10/2020.  She states that the injection caused increased pain for 2 to 3 days but now pain is resolved and the stiffness and swelling she had at the time is now markedly improved..                ROS: All systems reviewed are negative as they relate to the chief complaint within the history of present illness.  Patient denies fevers or chills.  Assessment & Plan: Visit Diagnoses:  1. Effusion, right knee     Plan: Patient is a 35 year old female who presents complaining of right knee swelling.  She is here to review MRI of the right knee.  MRI revealed mild medial compartment degenerative changes with meniscal capsular tear of the medial meniscus without extension into the articular surface of the meniscus.  She has edema in the weightbearing portion of the medial femoral condyle but no chondral defect is noted.  There is nothing definitively arthroscopically treatable.  Patient is not having any mechanical symptoms and the swelling and stiffness she was experiencing is much improved since the  injection.  Plan is for patient to continue with new exercise regimen that will have less weightbearing force on the knee.  Recommended stationary bike or elliptical as well as nonloadbearing quadricep strengthening exercises in order to optimize her quad strength without putting significant force on the knee.  Plan for her to follow-up as needed.  Follow-Up Instructions: No follow-ups on file.   Orders:  No orders of the defined types were placed in this encounter.  No orders of the defined types were placed in this encounter.     Procedures: No procedures performed   Clinical Data: No additional findings.  Objective: Vital Signs: There were no vitals taken for this visit.  Physical Exam:  Constitutional: Patient appears well-developed HEENT:  Head: Normocephalic Eyes:EOM are normal Neck: Normal range of motion Cardiovascular: Normal rate Pulmonary/chest: Effort normal Neurologic: Patient is alert Skin: Skin is warm Psychiatric: Patient has normal mood and affect  Ortho Exam: Ortho exam demonstrates right knee with no tenderness over the medial and lateral joint lines.  Mild effusion present.  Able to perform straight leg raise without difficulty.  No calf tenderness.  0 degrees extension and 120 degrees of knee flexion.  Specialty Comments:  No specialty comments available.  Imaging: No results found.   PMFS History: Patient Active Problem List   Diagnosis Date Noted   Lupus nephritis, ISN/RPS  class V (HCC) 08/10/2020   Systemic lupus erythematosus (SLE) in adult [facility]) 11/12/2019   Other proteinuria 11/12/2019   Vitamin D deficiency 11/12/2019   History of asthma 11/12/2019   Seasonal allergies 11/12/2019   Former smoker 11/12/2019   Past Medical History:  Diagnosis Date   Systemic lupus erythematosus (HCC)     Family History  Problem Relation Age of Onset   Hypertension Mother    Cancer Mother    Arthritis Father    Hypertension Other    Diabetes  Other    Arthritis Other    Sarcoidosis Paternal Grandmother    Sarcoidosis Other     Past Surgical History:  Procedure Laterality Date   RENAL BIOPSY     WRIST SURGERY     biopsy   Social History   Occupational History   Not on file  Tobacco Use   Smoking status: Former    Pack years: 0.00    Types: Cigarettes   Smokeless tobacco: Never   Tobacco comments:    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

## 2020-12-09 ENCOUNTER — Telehealth: Payer: Self-pay | Admitting: Orthopedic Surgery

## 2020-12-09 NOTE — Telephone Encounter (Signed)
Pt called and states that her knee gave out on her and she is in a lot of pain. She said luke told her she had a small tear in her MRI and to call if anything happens. She would like to know if she could come in to be seen? I don't see anything available until the 12/24/2020 and that's too long for her to wait.   CB [phone]

## 2020-12-09 NOTE — Telephone Encounter (Signed)
Holding to see if pt can be worked in

## 2020-12-13 NOTE — Telephone Encounter (Signed)
Ok for this week thx

## 2020-12-14 NOTE — Telephone Encounter (Signed)
scheduled

## 2020-12-15 ENCOUNTER — Ambulatory Visit (INDEPENDENT_AMBULATORY_CARE_PROVIDER_SITE_OTHER): Payer: BC Managed Care – PPO | Admitting: Orthopedic Surgery

## 2020-12-15 ENCOUNTER — Encounter: Payer: Self-pay | Admitting: Orthopedic Surgery

## 2020-12-15 DIAGNOSIS — M25461 Effusion, right knee: Secondary | ICD-10-CM | POA: Diagnosis not present

## 2020-12-15 MED ORDER — LIDOCAINE HCL 1 % IJ SOLN
5.0000 mL | INTRAMUSCULAR | Status: AC | PRN
  Administered 2020-12-15: 5 mL

## 2020-12-15 NOTE — Progress Notes (Signed)
Office Visit Note   Patient: Kristin Murphy           Date of Birth: 1986/02/13           MRN: [id number] Visit Date: 12/15/2020 Requested by: [facility] [address] [place],  [place] [address] PCP: [facility]  Subjective: Chief Complaint  Patient presents with   Right Knee - Pain    HPI: Patient presents for reevaluation of right knee pain.  MRI scan shows potential nondisplaced chondral issue on the medial femoral condyle along with degenerative signal within the meniscus.  She reports some new popping and had recurrent swelling.  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  Scan is reviewed with the patient.  I think it is possible that that inferior meniscal flap of the meniscus could be symptomatic accounting for her perceived swelling or/and that chondral area on the medial femoral condyle could be symptomatic.              ROS: All systems reviewed are negative as they relate to the chief complaint within the history of present illness.  Patient denies  fevers or chills.   Assessment & Plan: Visit Diagnoses:  1. Effusion, right knee     Plan: Impression is right knee effusion with recurrent mechanical symptoms with possible worsening of meniscal tear on the medial side and/or chondral delamination issue.  Plan is aspiration today which we have 35 cc of serous fluid.  No injection performed.  She did have a cortisone shot a month ago.  She is on Biologics for autoimmune issues.  Hinged knee brace provided.  Plan to reach out to her next week and potentially schedule arthroscopic evaluation and intervention which would be meniscal debridement/repair versus chondral debridement with bio cartilage placement.  She would need to be off her Biologics a week or 2 before that surgery.  Follow-Up Instructions: Return if symptoms worsen or fail to improve.   Orders:  No orders of the defined types were placed in this encounter.  No orders  of the defined types were placed in this encounter.     Procedures: Large Joint Inj: R knee on 12/15/2020 12:12 PM Indications: diagnostic evaluation, joint swelling and pain Details: 18 G 1.5 in needle, superolateral approach  Arthrogram: No  Medications: 5 mL lidocaine 1 % Aspirate: 35 mL yellow Outcome: tolerated well, no immediate complications Procedure, treatment alternatives, risks and benefits explained, specific risks discussed. Consent was given by the patient. Immediately prior to procedure a time out was called to verify the correct patient, procedure, equipment, support staff and site/side marked as required. Patient was prepped and draped in the usual sterile fashion.      Clinical Data: No additional findings.  Objective: Vital Signs: There were no vitals taken for this visit.  Physical Exam:   Constitutional: Patient appears well-developed HEENT:  Head: Normocephalic Eyes:EOM are normal Neck: Normal range of motion Cardiovascular: Normal rate Pulmonary/chest: Effort normal Neurologic: Patient is alert Skin: Skin is warm Psychiatric: Patient has normal mood and affect   Ortho Exam: Ortho exam demonstrates full range of motion of the right knee with moderate effusion.  Collateral pressure ligaments are stable.  Patient does have medial joint line tenderness but no patellofemoral crepitus which is asymmetric right versus left.  No groin pain with internal ex rotation of the leg.  Pedal pulses palpable.  Specialty Comments:  No specialty comments available.  Imaging: No results found.  PMFS History: Patient Active Problem List   Diagnosis Date Noted   Lupus nephritis, ISN/RPS class V (HCC) 08/10/2020   Systemic lupus erythematosus (SLE) in adult (HCC) 11/12/2019   Other proteinuria 11/12/2019   Vitamin D deficiency 11/12/2019   History of asthma 11/12/2019   Seasonal allergies 11/12/2019   Former smoker 11/12/2019   Past Medical History:   Diagnosis Date   Systemic lupus erythematosus (HCC)     Family History  Problem Relation Age of Onset   Hypertension Mother    Cancer Mother    Arthritis Father    Hypertension Other    Diabetes Other    Arthritis Other    Sarcoidosis Paternal Grandmother    Sarcoidosis Other     Past Surgical History:  Procedure Laterality Date   RENAL BIOPSY     WRIST SURGERY     biopsy   Social History   Occupational History   Not on file  Tobacco Use   Smoking status: Former    Types: Cigarettes   Smokeless tobacco: Never   Tobacco comments:    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

## 2020-12-20 ENCOUNTER — Ambulatory Visit: Payer: BC Managed Care – PPO | Admitting: Orthopedic Surgery

## 2020-12-21 ENCOUNTER — Other Ambulatory Visit: Payer: Self-pay

## 2020-12-21 MED ORDER — AZATHIOPRINE 50 MG PO TABS: 150.0000 mg | ORAL_TABLET | Freq: Every day | ORAL | 0 refills | Status: DC

## 2020-12-21 NOTE — Telephone Encounter (Signed)
  Next Visit: 01/11/2021  Last Visit: 11/10/2020  Last Fill: 08/17/2020  DX: Systemic lupus erythematosus (SLE) in adult  Current Dose per office note 11/10/2020: lab results note 11/12/2020, increase Imuran 50 milligrams tablets, 3 tablets p.o. daily due to elevated sed rate and ongoing inflammation in her knee  Labs: 11/12/2020, Protein creatinine ratio is high.  Please forward labs to her nephrologist and advised patient to schedule a follow-up appointment with the nephrology.  White blood cell count is low due to immunosuppression .CMP is normal, sed rate is elevated at 45.  vitamin D is normal.  anti-DNA is pending.  Please advise patient to increase Imuran 50 milligrams tablets, 3 tablets p.o. daily due to elevated sed rate and ongoing inflammation in her knee.  She should have repeatCBC and CMP in 2 weeks and then 2 months.  Okay to refill Imuran?

## 2020-12-21 NOTE — Telephone Encounter (Signed)
Patient called requesting prescription refill of Azathioprine to be sent to [name] at [address]

## 2020-12-27 DIAGNOSIS — Z124 Encounter for screening for malignant neoplasm of cervix: Secondary | ICD-10-CM | POA: Diagnosis not present

## 2020-12-27 DIAGNOSIS — N760 Acute vaginitis: Secondary | ICD-10-CM | POA: Diagnosis not present

## 2020-12-27 DIAGNOSIS — Z01419 Encounter for gynecological examination (general) (routine) without abnormal findings: Secondary | ICD-10-CM | POA: Diagnosis not present

## 2020-12-28 NOTE — Progress Notes (Signed)
Office Visit Note  Patient: Kristin Murphy             Date of Birth: 16-Sep-1985           MRN: [id number]             PCP: [facility], [facility] Referring: [facility], [facility] [place] [facility] Visit Date: 01/11/2021 Occupation: @GUAROCC @  Subjective:  Right knee pain   History of Present Illness: LASHIKA ERKER is a 35 y.o. female with a history of systemic lupus and lupus nephritis.  She states she had been doing well on the combination of Imuran and hydroxychloroquine.  She stopped Imuran 2 days ago as she is a scheduled for right knee joint arthroscopic surgery.  She has been taking hydroxychloroquine.  She denies any joint pain or joint swelling.  There is no history of oral ulcers, nasal ulcers, malar rash, photosensitivity, sicca symptoms or Raynaud's phenomenon.  Besides her right knee joint Raynauds other joints are painful.  Elbow joint contractures completely resolved.  Activities of Daily Living:  Patient reports morning stiffness for 0 minutes.   Patient Denies nocturnal pain.  Difficulty dressing/grooming: Denies Difficulty climbing stairs: Reports Difficulty getting out of chair: Reports Difficulty using hands for taps, buttons, cutlery, and/or writing: Denies  Review of Systems  Constitutional:  Positive for fatigue.  HENT:  Negative for mouth sores, mouth dryness and nose dryness.   Eyes:  Positive for dryness. Negative for pain and itching.  Respiratory:  Negative for shortness of breath and difficulty breathing.   Cardiovascular:  Negative for chest pain and palpitations.  Gastrointestinal:  Negative for blood in stool, constipation and diarrhea.  Endocrine: Negative for increased urination.  Genitourinary:  Negative for difficulty urinating.  Musculoskeletal:  Negative for joint pain, joint pain, joint swelling, myalgias, morning stiffness, muscle tenderness and myalgias.  Skin:  Negative for color change, rash and redness.  Allergic/Immunologic: Negative for  susceptible to infections.  Neurological:  Positive for headaches. Negative for dizziness, numbness, memory loss and weakness.  Hematological:  Positive for bruising/bleeding tendency.  Psychiatric/Behavioral:  Negative for confusion.    PMFS History:  Patient Active Problem List   Diagnosis Date Noted   Lupus nephritis, ISN/RPS class V (HCC) 08/10/2020   Systemic lupus erythematosus (SLE) in adult (HCC) 11/12/2019   Other proteinuria 11/12/2019   Vitamin D deficiency 11/12/2019   History of asthma 11/12/2019   Seasonal allergies 11/12/2019   Former smoker 11/12/2019    Past Medical History:  Diagnosis Date   Systemic lupus erythematosus (HCC)     Family History  Problem Relation Age of Onset   Hypertension Mother    Cancer Mother    Arthritis Father    Hypertension Other    Diabetes Other    Arthritis Other    Sarcoidosis Paternal Grandmother    Sarcoidosis Other    Past Surgical History:  Procedure Laterality Date   RENAL BIOPSY     WRIST SURGERY     biopsy   Social History   Social History Narrative   Not on file   Immunization History  Administered Date(s) Administered   PFIZER(Purple Top)SARS-COV-2 Vaccination 12/08/2019, 12/29/2019     Objective: Vital Signs: BP 113/75 (BP Location: Left Wrist, Patient Position: Sitting, Cuff Size: Normal)   Pulse (!) 56   Resp 16   Ht 5\' 6"  (1.676 m)   Wt 225 lb (102.1 kg)   BMI 36.32 kg/m    Physical Exam Vitals and nursing note reviewed.  Constitutional:  Appearance: She is well-developed.  HENT:     Head: Normocephalic and atraumatic.  Eyes:     Conjunctiva/sclera: Conjunctivae normal.  Cardiovascular:     Rate and Rhythm: Normal rate and regular rhythm.     Heart sounds: Normal heart sounds.  Pulmonary:     Effort: Pulmonary effort is normal.     Breath sounds: Normal breath sounds.  Abdominal:     General: Bowel sounds are normal.     Palpations: Abdomen is soft.  Musculoskeletal:     Cervical  back: Normal range of motion.  Lymphadenopathy:     Cervical: No cervical adenopathy.  Skin:    General: Skin is warm and dry.     Capillary Refill: Capillary refill takes less than 2 seconds.  Neurological:     Mental Status: She is alert and oriented to person, place, and time.  Psychiatric:        Behavior: Behavior normal.     Musculoskeletal Exam: C-spine thoracic and lumbar spine were in good range of motion.  Shoulder joints, elbow joints, wrist joints, MCPs PIPs and DIPs with good range of motion with no synovitis.  Hip joints, knee joints, ankles, MTPs and PIPs with good range of motion with no synovitis.  CDAI Exam: CDAI Score: -- Patient Global: --; Provider Global: -- Swollen: --; Tender: -- Joint Exam 01/11/2021   No joint exam has been documented for this visit   There is currently no information documented on the homunculus. Go to the Rheumatology activity and complete the homunculus joint exam.  Investigation: No additional findings.  Imaging: No results found.  Recent Labs: Lab Results  Component Value Date   WBC 2.7 (L) 01/07/2021   HGB 12.6 01/07/2021   PLT 221 01/07/2021   NA 138 01/07/2021   K 4.5 01/07/2021   CL 107 01/07/2021   CO2 27 01/07/2021   GLUCOSE 88 01/07/2021   BUN 7 01/07/2021   CREATININE 0.80 01/07/2021   BILITOT 0.3 01/07/2021   AST 12 01/07/2021   ALT 6 01/07/2021   PROT 6.7 01/07/2021   CALCIUM 8.8 01/07/2021   GFRAA 118 11/12/2020   QFTBGOLDPLUS NEGATIVE 09/02/2019    Speciality Comments: PLQ eye exam: 07/20/2020 normal. London Sheer, OD. Follow up in 1 year.  Procedures:  No procedures performed Allergies: Amoxicillin, Paba derivatives, Penicillins, and Sulfa antibiotics   Assessment / Plan:     Visit Diagnoses: Systemic lupus erythematosus (SLE) in adult (HCC) - Positive ANA, positive Ro, positive Sm, positive RNP, proteinuria, inflammatory arthritis, lupus nephritis.  She has some warmth on palpation of her right knee  joint.  None of the other joints were painful.  There is no history of oral ulcers, nasal ulcers, malar rash, photosensitivity or Raynaud's phenomenon currently.  Lupus nephritis, ISN/RPS class V (HCC) - Renal biopsy Oct 21, 2019 consistent with membranous lupus nephritis (class V), proteinuria.  Followed by Dr. Marisue Humble at [facility].  High risk medication use - Plaquenil 200 mg twice daily, Imuran 150 mg p.o. daily. PLQ eye exam: 07/20/2020.  She is holding Imuran for the last 2 days and will be holding for about a week after arthroscopic surgery.  Her most recent labs showed neutropenia.  We will see her serology.  She will come back for lab work in 2 weeks.  We will adjust the dose of Imuran based on it.  Neutropenia-her most recent labs showed low white cell count.  Which could be due to disease process or due  to immunosuppression.  We will check serology.  Will adjust the dose of Imuran accordingly.  Other proteinuria - followed by nephrology.  Chronic pain of right knee - scheduled for right knee joint arthroscopic surgery on January 17, 2021 by Dr. August Saucer.  She will hold Imuran 1 week prior to the surgery and will restart 1 week after surgery if she gets clearance from Dr. Derrek Monaco.  Palmar wart - she has a small wart on the palm of her right hand.    Vitamin D deficiency-she has been taking vitamin D supplement.  We will check vitamin D level with the next labs.  Complains of discoloration of her urine recently.  We will check UA today.  We will call her back with the results.  History of asthma  Seasonal allergies  Former smoker  Orders: Orders Placed This Encounter  Procedures   Urinalysis, Routine w reflex microscopic   Protein / creatinine ratio, urine    No orders of the defined types were placed in this encounter.    Follow-Up Instructions: Return in about 3 months (around 04/13/2021) for Systemic lupus.   Pollyann Savoy, MD  Note - This record has been  created using Animal nutritionist.  Chart creation errors have been sought, but may not always  have been located. Such creation errors do not reflect on  the standard of medical care.

## 2020-12-30 ENCOUNTER — Other Ambulatory Visit: Payer: Self-pay

## 2020-12-30 ENCOUNTER — Encounter: Payer: Self-pay | Admitting: Orthopedic Surgery

## 2020-12-30 ENCOUNTER — Ambulatory Visit (INDEPENDENT_AMBULATORY_CARE_PROVIDER_SITE_OTHER): Payer: BC Managed Care – PPO | Admitting: Orthopedic Surgery

## 2020-12-30 DIAGNOSIS — M25461 Effusion, right knee: Secondary | ICD-10-CM | POA: Diagnosis not present

## 2020-12-30 NOTE — Progress Notes (Signed)
Office Visit Note   Patient: Kristin Murphy           Date of Birth: [birth year]           MRN: [id number] Visit Date: 12/30/2020 Requested by: Center, [facility] [address] [place],  [place] [address] PCP: [facility], [facility]  Subjective: Chief Complaint  Patient presents with   Right Knee - Follow-up    HPI: Patient presents for follow-up evaluation of right knee.  She had prior injection in June of this year.  She has had an MRI scan which shows possible chondral defect on the medial femoral condyle along with inferior meniscal flap tear.  She continues to report mechanical symptoms in the knee which she localizes to some degree around the medial aspect of the knee but mostly around the patella femoral region.  She does report some giving way.  She has a knee brace which helps.  She is on Imuran and Plaquenil daily.  Denies any personal or family history of DVT or pulmonary embolism.              ROS: All systems reviewed are negative as they relate to the chief complaint within the history of present illness.  Patient denies  fevers or chills.   Assessment & Plan: Visit Diagnoses:  1. Effusion, right knee     Plan: She has right knee pain with possible chondral defect and/or symptomatic meniscal tear.  Plan is arthroscopy and debridement with microfracture and/or possible bio cartilage implantation depending on the size of the lesion.  Risk and benefits of the procedure discussed with the patient including not limited to infection nerve vessel damage knee stiffness incomplete pain relief as well as potential for development of arthritis.  Patient understands the risk and benefits and wishes to proceed.  All questions answered.  Follow-Up Instructions: No follow-ups on file.   Orders:  No orders of the defined types were placed in this encounter.  No orders of the defined types were placed in this encounter.     Procedures: No procedures  performed   Clinical Data: No additional findings.  Objective: Vital Signs: There were no vitals taken for this visit.  Physical Exam:   Constitutional: Patient appears well-developed HEENT:  Head: Normocephalic Eyes:EOM are normal Neck: Normal range of motion Cardiovascular: Normal rate Pulmonary/chest: Effort normal Neurologic: Patient is alert Skin: Skin is warm Psychiatric: Patient has normal mood and affect   Ortho Exam: Ortho exam demonstrates full active and passive range of motion of the right knee with no effusion.  Negative patellar apprehension.  Patient has full range of motion with stable collateral cruciate ligaments.  Equivocal McMurray compression testing.  Symmetric patellofemoral crepitus which is mild.  No masses lymphadenopathy or skin changes noted in the right knee region. Specialty Comments:  No specialty comments available.  Imaging: No results found.   PMFS History: Patient Active Problem List   Diagnosis Date Noted   Lupus nephritis, ISN/RPS class V (HCC) 08/10/2020   Systemic lupus erythematosus (SLE) in adult (HCC) 11/12/2019   Other proteinuria 11/12/2019   Vitamin D deficiency 11/12/2019   History of asthma 11/12/2019   Seasonal allergies 11/12/2019   Former smoker 11/12/2019   Past Medical History:  Diagnosis Date   Systemic lupus erythematosus (HCC)     Family History  Problem Relation Age of Onset   Hypertension Mother    Cancer Mother    Arthritis Father    Hypertension Other  Diabetes Other    Arthritis Other    Sarcoidosis Paternal Grandmother    Sarcoidosis Other     Past Surgical History:  Procedure Laterality Date   RENAL BIOPSY     WRIST SURGERY     biopsy   Social History   Occupational History   Not on file  Tobacco Use   Smoking status: Former    Types: Cigarettes   Smokeless tobacco: Never   Tobacco comments:    college  Building services engineer Use: Former  Substance and Sexual Activity   Alcohol  use: Yes    Comment: rarely   Drug use: Yes    Types: Marijuana   Sexual activity: Not on file

## 2021-01-07 ENCOUNTER — Other Ambulatory Visit: Payer: Self-pay | Admitting: *Deleted

## 2021-01-07 ENCOUNTER — Telehealth: Payer: Self-pay | Admitting: *Deleted

## 2021-01-07 DIAGNOSIS — Z79899 Other long term (current) drug therapy: Secondary | ICD-10-CM | POA: Diagnosis not present

## 2021-01-07 NOTE — Telephone Encounter (Signed)
Patient states she is scheduled for knee surgery with Dr. August Saucer on 01/17/2021. Patient is on Imuran and PLQ. Please advise.

## 2021-01-07 NOTE — Telephone Encounter (Signed)
Patient should continue Plaquenil.  She may stop Imuran 1 week prior to surgery and restart 1 week after there is no infection.

## 2021-01-07 NOTE — Telephone Encounter (Signed)
Left message to advise patient she should continue Plaquenil.  She may stop Imuran 1 week prior to surgery and restart 1 week after there is no infection.

## 2021-01-08 LAB — CBC WITH DIFFERENTIAL/PLATELET
Absolute Monocytes: 454 cells/uL (ref 200–950)
Basophils Absolute: 30 cells/uL (ref 0–200)
Basophils Relative: 1.1 %
Eosinophils Absolute: 78 cells/uL (ref 15–500)
Eosinophils Relative: 2.9 %
HCT: 39.5 % (ref 35.0–45.0)
Hemoglobin: 12.6 g/dL (ref 11.7–15.5)
Lymphs Abs: 1207 cells/uL (ref 850–3900)
MCH: 29.4 pg (ref 27.0–33.0)
MCHC: 31.9 g/dL — ABNORMAL LOW (ref 32.0–36.0)
MCV: 92.3 fL (ref 80.0–100.0)
MPV: 11.7 fL (ref 7.5–12.5)
Monocytes Relative: 16.8 %
Neutro Abs: 932 cells/uL — ABNORMAL LOW (ref 1500–7800)
Neutrophils Relative %: 34.5 %
Platelets: 221 10*3/uL (ref 140–400)
RBC: 4.28 10*6/uL (ref 3.80–5.10)
RDW: 14.7 % (ref 11.0–15.0)
Total Lymphocyte: 44.7 %
WBC: 2.7 10*3/uL — ABNORMAL LOW (ref 3.8–10.8)

## 2021-01-08 LAB — COMPLETE METABOLIC PANEL WITH GFR
AG Ratio: 1.1 (calc) (ref 1.0–2.5)
ALT: 6 U/L (ref 6–29)
AST: 12 U/L (ref 10–30)
Albumin: 3.5 g/dL — ABNORMAL LOW (ref 3.6–5.1)
Alkaline phosphatase (APISO): 55 U/L (ref 31–125)
BUN: 7 mg/dL (ref 7–25)
CO2: 27 mmol/L (ref 20–32)
Calcium: 8.8 mg/dL (ref 8.6–10.2)
Chloride: 107 mmol/L (ref 98–110)
Creat: 0.8 mg/dL (ref 0.50–0.97)
Globulin: 3.2 g/dL (calc) (ref 1.9–3.7)
Glucose, Bld: 88 mg/dL (ref 65–99)
Potassium: 4.5 mmol/L (ref 3.5–5.3)
Sodium: 138 mmol/L (ref 135–146)
Total Bilirubin: 0.3 mg/dL (ref 0.2–1.2)
Total Protein: 6.7 g/dL (ref 6.1–8.1)
eGFR: 98 mL/min/{1.73_m2} (ref >=60)

## 2021-01-09 NOTE — Progress Notes (Signed)
White cell count is low, most likely due to immunosuppression.  Please have patient repeat CBC in 2 weeks.  CMP is normal.

## 2021-01-11 ENCOUNTER — Other Ambulatory Visit: Payer: Self-pay

## 2021-01-11 ENCOUNTER — Encounter: Payer: Self-pay | Admitting: Rheumatology

## 2021-01-11 ENCOUNTER — Ambulatory Visit: Payer: BC Managed Care – PPO | Admitting: Rheumatology

## 2021-01-11 VITALS — BP 113/75 | HR 56 | Resp 16 | Ht 66.0 in | Wt 225.0 lb

## 2021-01-11 DIAGNOSIS — E559 Vitamin D deficiency, unspecified: Secondary | ICD-10-CM

## 2021-01-11 DIAGNOSIS — R808 Other proteinuria: Secondary | ICD-10-CM | POA: Diagnosis not present

## 2021-01-11 DIAGNOSIS — M25561 Pain in right knee: Secondary | ICD-10-CM

## 2021-01-11 DIAGNOSIS — J302 Other seasonal allergic rhinitis: Secondary | ICD-10-CM

## 2021-01-11 DIAGNOSIS — B078 Other viral warts: Secondary | ICD-10-CM

## 2021-01-11 DIAGNOSIS — M329 Systemic lupus erythematosus, unspecified: Secondary | ICD-10-CM

## 2021-01-11 DIAGNOSIS — R21 Rash and other nonspecific skin eruption: Secondary | ICD-10-CM

## 2021-01-11 DIAGNOSIS — M3214 Glomerular disease in systemic lupus erythematosus: Secondary | ICD-10-CM | POA: Diagnosis not present

## 2021-01-11 DIAGNOSIS — D708 Other neutropenia: Secondary | ICD-10-CM

## 2021-01-11 DIAGNOSIS — Z79899 Other long term (current) drug therapy: Secondary | ICD-10-CM

## 2021-01-11 DIAGNOSIS — G8929 Other chronic pain: Secondary | ICD-10-CM

## 2021-01-11 DIAGNOSIS — Z8709 Personal history of other diseases of the respiratory system: Secondary | ICD-10-CM

## 2021-01-11 DIAGNOSIS — Z87891 Personal history of nicotine dependence: Secondary | ICD-10-CM

## 2021-01-11 DIAGNOSIS — M24521 Contracture, right elbow: Secondary | ICD-10-CM

## 2021-01-12 LAB — PROTEIN / CREATININE RATIO, URINE
Creatinine, Urine: 107 mg/dL (ref 20–275)
Protein/Creat Ratio: 121 mg/g creat (ref 21–161)
Protein/Creatinine Ratio: 0.121 mg/mg creat (ref 0.021–0.161)
Total Protein, Urine: 13 mg/dL (ref 5–24)

## 2021-01-12 LAB — URINALYSIS, ROUTINE W REFLEX MICROSCOPIC
Bilirubin Urine: NEGATIVE
Glucose, UA: NEGATIVE
Hgb urine dipstick: NEGATIVE
Ketones, ur: NEGATIVE
Leukocytes,Ua: NEGATIVE
Nitrite: NEGATIVE
Protein, ur: NEGATIVE
Specific Gravity, Urine: 1.011 (ref 1.001–1.035)
pH: 7 (ref 5.0–8.0)

## 2021-01-12 NOTE — Progress Notes (Signed)
UA is negative, urine protein creatinine ratio is normal.  Please forward results to nephrologist.

## 2021-01-14 ENCOUNTER — Other Ambulatory Visit: Payer: Self-pay | Admitting: Orthopedic Surgery

## 2021-01-14 MED ORDER — OXYCODONE HCL 5 MG PO CAPS: 5.0000 mg | ORAL_CAPSULE | ORAL | 0 refills | Status: DC | PRN

## 2021-01-14 MED ORDER — METHOCARBAMOL 500 MG PO TABS: 500.0000 mg | ORAL_TABLET | Freq: Four times a day (QID) | ORAL | 0 refills | Status: DC

## 2021-01-17 ENCOUNTER — Encounter: Payer: Self-pay | Admitting: Orthopedic Surgery

## 2021-01-17 DIAGNOSIS — M948X6 Other specified disorders of cartilage, lower leg: Secondary | ICD-10-CM | POA: Diagnosis not present

## 2021-01-17 DIAGNOSIS — G8918 Other acute postprocedural pain: Secondary | ICD-10-CM | POA: Diagnosis not present

## 2021-01-17 DIAGNOSIS — M94261 Chondromalacia, right knee: Secondary | ICD-10-CM | POA: Diagnosis not present

## 2021-01-24 ENCOUNTER — Other Ambulatory Visit: Payer: Self-pay

## 2021-01-24 ENCOUNTER — Ambulatory Visit (INDEPENDENT_AMBULATORY_CARE_PROVIDER_SITE_OTHER): Payer: BC Managed Care – PPO | Admitting: Orthopedic Surgery

## 2021-01-24 ENCOUNTER — Encounter: Payer: Self-pay | Admitting: Orthopedic Surgery

## 2021-01-24 DIAGNOSIS — Z9889 Other specified postprocedural states: Secondary | ICD-10-CM

## 2021-01-24 DIAGNOSIS — M25461 Effusion, right knee: Secondary | ICD-10-CM

## 2021-01-24 MED ORDER — ASPIRIN 81 MG PO CHEW: 81.0000 mg | CHEWABLE_TABLET | Freq: Every day | ORAL | 0 refills | Status: AC

## 2021-01-24 NOTE — Progress Notes (Signed)
   Post-Op Visit Note   Patient: Kristin Murphy           Date of Birth: [birth year]           MRN: [id number] Visit Date: 01/24/2021 PCP: [facility], [facility]   Assessment & Plan:  Chief Complaint:  Chief Complaint  Patient presents with   Right Knee - Routine Post Op   Visit Diagnoses:  1. Effusion, right knee   2. Status post arthroscopy of right knee     Plan: Patient is a 35 year old female presents s/p right knee arthroscopy with debridement and microfracture on 01/17/2021.  She is currently nonweightbearing with crutches and walker.  She has not had any injury or fall since the procedure.  She states her pain is well controlled and she is really using just ice to control her pain.  She is not taking aspirin since the procedure but she has restarted her Plaquenil.  Okay to restart her Imuran.  She denies any chest pain, shortness of breath, calf pain.  No fevers or night sweats.  She has an occasional cough but nothing consistent and no consistent productivity of the cough.  On exam she has incisions that are healing well with sutures intact.  No calf tenderness.  Negative Homans' sign.  Positive effusion present.  She is able to perform multiple straight leg raises without extensor lag.  She extends to 5 degrees with a soft endpoint and able to be pushed to around 0 degrees.  She gets 90 degrees of knee flexion.  Plan to continue nonweightbearing and start taking aspirin every day while she is nonweightbearing.  Follow-up in 4 weeks for clinical recheck.  She can start weightbearing in 3 weeks.  Follow-Up Instructions: No follow-ups on file.   Orders:  No orders of the defined types were placed in this encounter.  No orders of the defined types were placed in this encounter.   Imaging: No results found.  PMFS History: Patient Active Problem List   Diagnosis Date Noted   Lupus nephritis, ISN/RPS class V (HCC) 08/10/2020   Systemic lupus erythematosus (SLE) in adult  (HCC) 11/12/2019   Other proteinuria 11/12/2019   Vitamin D deficiency 11/12/2019   History of asthma 11/12/2019   Seasonal allergies 11/12/2019   Former smoker 11/12/2019   Past Medical History:  Diagnosis Date   Systemic lupus erythematosus (HCC)     Family History  Problem Relation Age of Onset   Hypertension Mother    Cancer Mother    Arthritis Father    Hypertension Other    Diabetes Other    Arthritis Other    Sarcoidosis Paternal Grandmother    Sarcoidosis Other     Past Surgical History:  Procedure Laterality Date   RENAL BIOPSY     WRIST SURGERY     biopsy   Social History   Occupational History   Not on file  Tobacco Use   Smoking status: Former    Types: Cigarettes   Smokeless tobacco: Never   Tobacco 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

## 2021-01-27 ENCOUNTER — Telehealth: Payer: Self-pay

## 2021-01-27 NOTE — Telephone Encounter (Signed)
Pt called in stating that she is retaining a lot of fluid on the top and the side of her knee. She is stating that it is tight and she can't bend it and her toes are going numb along with a shooting pain up the back of her thigh

## 2021-01-27 NOTE — Telephone Encounter (Signed)
Spoke with Dr August Saucer. Worked patient in tomorrow morning per Dr August Saucer

## 2021-01-28 ENCOUNTER — Other Ambulatory Visit: Payer: Self-pay

## 2021-01-28 ENCOUNTER — Ambulatory Visit (INDEPENDENT_AMBULATORY_CARE_PROVIDER_SITE_OTHER): Payer: PRIVATE HEALTH INSURANCE | Admitting: Orthopedic Surgery

## 2021-01-28 DIAGNOSIS — M25461 Effusion, right knee: Secondary | ICD-10-CM

## 2021-01-31 ENCOUNTER — Encounter: Payer: Self-pay | Admitting: Orthopedic Surgery

## 2021-01-31 NOTE — Progress Notes (Signed)
   Post-Op Visit Note   Patient: Kristin Murphy           Date of Birth: 1986/05/16           MRN: [id number] Visit Date: 01/28/2021 PCP: [facility], [facility]   Assessment & Plan:  Chief Complaint:  Chief Complaint  Patient presents with   Right Knee - Pain, Edema   Visit Diagnoses: No diagnosis found.  Plan: Patient presents now about 10 days out from knee arthroscopy and microfracture for full-thickness chondral defect which was about 2 cm long and 5 or 6 mm wide.  She has some effusion.  She has been nonweightbearing.  On aspirin for DVT prophylaxis.  Negative Homans no calf tenderness today.  About 40 cc effusion is aspirated Toradol is injected.  Plan to see her back for her scheduled appointment.  Follow-Up Instructions: No follow-ups on file.   Orders:  No orders of the defined types were placed in this encounter.  No orders of the defined types were placed in this encounter.   Imaging: No results found.  PMFS History: Patient Active Problem List   Diagnosis Date Noted   Lupus nephritis, ISN/RPS class V (HCC) 08/10/2020   Systemic lupus erythematosus (SLE) in adult (HCC) 11/12/2019   Other proteinuria 11/12/2019   Vitamin D deficiency 11/12/2019   History of asthma 11/12/2019   Seasonal allergies 11/12/2019   Former smoker 11/12/2019   Past Medical History:  Diagnosis Date   Systemic lupus erythematosus (HCC)     Family History  Problem Relation Age of Onset   Hypertension Mother    Cancer Mother    Arthritis Father    Hypertension Other    Diabetes Other    Arthritis Other    Sarcoidosis Paternal Grandmother    Sarcoidosis Other     Past Surgical History:  Procedure Laterality Date   RENAL BIOPSY     WRIST SURGERY     biopsy   Social History   Occupational History   Not on file  Tobacco Use   Smoking status: Former    Types: Cigarettes   Smokeless tobacco: Never   Tobacco comments:    college  Building services engineer Use: Former   Substance and Sexual Activity   Alcohol use: Yes    Comment: rarely   Drug use: Yes    Types: Marijuana    Comment: daily   Sexual activity: Not on file

## 2021-02-21 ENCOUNTER — Ambulatory Visit (INDEPENDENT_AMBULATORY_CARE_PROVIDER_SITE_OTHER): Payer: PRIVATE HEALTH INSURANCE | Admitting: Orthopedic Surgery

## 2021-02-21 ENCOUNTER — Other Ambulatory Visit: Payer: Self-pay

## 2021-02-21 DIAGNOSIS — Z9889 Other specified postprocedural states: Secondary | ICD-10-CM

## 2021-02-22 ENCOUNTER — Encounter: Payer: Self-pay | Admitting: Orthopedic Surgery

## 2021-02-22 NOTE — Progress Notes (Signed)
   Post-Op Visit Note   Patient: Kristin Murphy           Date of Birth: [birth year]           MRN: [id number] Visit Date: 02/21/2021 PCP: [facility], [facility]   Assessment & Plan:  Chief Complaint:  Chief Complaint  Patient presents with   Right Knee - Routine Post Op    01/18/21 rt knee scope with micro fx   Visit Diagnoses:  1. Status post arthroscopy of right knee     Plan: Patient is a 35 year old female who presents s/p right knee arthroscopy with microfracture on 01/18/2021.  She is currently ambulating full weightbearing with crutches and feels well overall.  She just began weightbearing in the last week.  She does report that without the crutches it feels like her leg wants to give out on her.  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  On exam her incisions are well-healed.  She has range of motion from 0 to 120 degrees passively.  No calf tenderness.  Negative Homans' sign.  She has a small effusion.  She does have difficulty with straight leg raise; she is able to perform but only with about 20 degrees of extension lag.  Quad is weak compared with the other side though it is firing.  Plan to start physical therapy upstairs to focus on quad strengthening with BFR.  Follow-up in 4 weeks for clinical recheck.  Keep walking with crutches until she is able to perform 15 straight leg raises easily without extensor lag and then she may discontinue them.  Follow-Up Instructions: No follow-ups on file.   Orders:  Orders Placed This Encounter  Procedures   Ambulatory referral to Physical Therapy   No orders of the defined types were placed in this encounter.   Imaging: No results found.  PMFS History: Patient Active Problem List   Diagnosis Date Noted   Lupus nephritis, ISN/RPS class V (HCC) 08/10/2020   Systemic lupus erythematosus (SLE) in adult (HCC) 11/12/2019    Other proteinuria 11/12/2019   Vitamin D deficiency 11/12/2019   History of asthma 11/12/2019   Seasonal allergies 11/12/2019   Former smoker 11/12/2019   Past Medical History:  Diagnosis Date   Systemic lupus erythematosus (HCC)     Family History  Problem Relation Age of Onset   Hypertension Mother    Cancer Mother    Arthritis Father    Hypertension Other    Diabetes Other    Arthritis Other    Sarcoidosis Paternal Grandmother    Sarcoidosis Other     Past Surgical History:  Procedure Laterality Date   RENAL BIOPSY     WRIST SURGERY     biopsy   Social History   Occupational History   Not on file  Tobacco Use   Smoking status: Former    Types: Cigarettes   Smokeless tobacco: Never   Tobacco 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

## 2021-02-23 ENCOUNTER — Other Ambulatory Visit: Payer: Self-pay

## 2021-02-23 ENCOUNTER — Ambulatory Visit (INDEPENDENT_AMBULATORY_CARE_PROVIDER_SITE_OTHER): Payer: PRIVATE HEALTH INSURANCE | Admitting: Physical Therapy

## 2021-02-23 ENCOUNTER — Encounter: Payer: Self-pay | Admitting: Physical Therapy

## 2021-02-23 DIAGNOSIS — R2689 Other abnormalities of gait and mobility: Secondary | ICD-10-CM

## 2021-02-23 DIAGNOSIS — M6281 Muscle weakness (generalized): Secondary | ICD-10-CM

## 2021-02-23 DIAGNOSIS — M25561 Pain in right knee: Secondary | ICD-10-CM | POA: Diagnosis not present

## 2021-02-23 DIAGNOSIS — R600 Localized edema: Secondary | ICD-10-CM | POA: Diagnosis not present

## 2021-02-23 DIAGNOSIS — M25661 Stiffness of right knee, not elsewhere classified: Secondary | ICD-10-CM

## 2021-02-23 NOTE — Patient Instructions (Signed)
Access Code: [facility] URL: [email or web address] Date: 02/23/2021 Prepared by: Moshe Cipro  Exercises Supine Quad Set - 12-15 x daily - 7 x weekly - 2 sets - 10 reps - 5 sec hold Supine Short Arc Quad - 10-12 x daily - 7 x weekly - 1-2 sets - 10 reps - 5 sec hold Seated Long Arc Quad - 10-12 x daily - 7 x weekly - 1-2 sets - 10 reps - 5 sec hold Standing Weight Shift Side to Side - 5-10 x daily - 7 x weekly - 1-2 sets - 10 reps - 5-10 sec hold

## 2021-02-23 NOTE — Therapy (Signed)
[facility] Physical Therapy [address] [place], [place], [address] Phone: [phone]   Fax:  [phone]  Physical Therapy Evaluation  Patient Details  Name: Kristin Murphy MRN: [id number] Date of Birth: 17-Aug-1985 Referring Provider (PT): Julieanne Cotton, [place]   Encounter Date: 02/23/2021   PT End of Session - 02/23/21 1256     Visit Number 1    Number of Visits 16    Date for PT Re-Evaluation 04/20/21    PT Start Time 1143    PT Stop Time 1225    PT Time Calculation (min) 42 min    Activity Tolerance Patient tolerated treatment well    Behavior During Therapy [facility] for tasks assessed/performed             Past Medical History:  Diagnosis Date   Systemic lupus erythematosus (HCC)     Past Surgical History:  Procedure Laterality Date   RENAL BIOPSY     WRIST SURGERY     biopsy    There were no vitals filed for this visit.    Subjective Assessment - 02/23/21 1147     Subjective Pt is a 35 y/o female who presents to OPPT s/p Rt knee arthroscopy with meniscectomy and microfracture on 01/18/21.  She was NWB initially and is now WBAT.  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    Limitations Standing;Walking    Patient Stated Goals improve mobility. strength, return to work    Currently in Pain? No/denies   c/o current stiffness   Pain Score 5     Pain Location Knee    Pain Orientation Right    Pain Descriptors / Indicators Discomfort    Pain Type Acute pain    Pain Onset More than a month ago    Pain Frequency Intermittent    Aggravating Factors  walking too much    Pain Relieving Factors rest, ice                [facility] PT Assessment - 02/23/21 1138       Assessment   Medical Diagnosis Z98.890 (ICD-10-CM) - Status post arthroscopy of right knee    Referring Provider (PT) Magnant, Joycie Peek, PA-C    Onset Date/Surgical Date 01/18/21    Hand Dominance Right    Next MD Visit 03/21/21     Prior Therapy none      Precautions   Precautions None      Restrictions   Weight Bearing Restrictions No      Balance Screen   Has the patient fallen in the past 6 months No    Has the patient had a decrease in activity level because of a fear of falling?  Yes    Is the patient reluctant to leave their home because of a fear of falling?  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    Leisure tea at [employer]; was doing daily running around 3 miles; has [employer]  Overall Cognitive Status Within Functional Limits for tasks assessed      Observation/Other Assessments   Observations disuse atrophy noted in RLE especially quad    Focus on Therapeutic Outcomes (FOTO)  43 (predicted 71)      Posture/Postural Control   Posture/Postural Control Postural limitations    Postural Limitations Rounded Shoulders;Forward head      ROM / Strength   AROM / PROM / Strength AROM;PROM;Strength      AROM   AROM Assessment Site Knee    Right/Left Knee Right;Left    Right Knee Extension -39   seated LAQ   Right Knee Flexion 115    Left Knee Extension 2    Left Knee Flexion 125      PROM   PROM Assessment Site Knee    Right/Left Knee Right    Right Knee Extension 0    Right Knee Flexion 130      Strength   Overall Strength Comments Rt knee 2-/5; poor quad activation noted initially; improved with cues and exercises      Ambulation/Gait   Gait Comments amb with bil crutches with step to pattern                        Objective measurements completed on examination: See above findings.       [facility] Adult PT  Treatment/Exercise - 02/23/21 1139       Exercises   Exercises Other Exercises    Other Exercises  see pt instructions - pt performed 3-5 reps of each exercise with mod cues for instruction                     PT Education - 02/23/21 1255     Education Details HEP    Person(s) Educated Patient    Methods Explanation;Demonstration;Handout    Comprehension Verbalized understanding;Returned demonstration;Need further instruction              PT Short Term Goals - 02/23/21 1301       PT SHORT TERM GOAL #1   Title Independent with initial HEP    Time 4    Period Weeks    Status New    Target Date 03/23/21      PT SHORT TERM GOAL #2   Title Rt knee AROM improved 0-10-120 for improved function    Time 4    Period Weeks    Status New    Target Date 03/23/21               PT Long Term Goals - 02/23/21 1302       PT LONG TERM GOAL #1   Title Independent with final HEP    Time 8    Period Weeks    Status New    Target Date 04/20/21      PT LONG TERM GOAL #2   Title Rt knee AROM improved 0-125 for improved function    Time 8    Period Weeks    Status New    Target Date 04/20/21      PT LONG TERM GOAL #3   Title Amb independently without significant deviations for improved function    Time 8    Period Weeks    Status New    Target Date 04/20/21      PT LONG TERM GOAL #4   Title Report pain < 2/10 for improved function    Time 8  Period Weeks    Status New    Target Date 04/20/21      PT LONG TERM GOAL #5   Title FOTO score improved to 71 for improved function    Time 8    Period Weeks    Status New    Target Date 04/20/21                    Plan - 02/23/21 1143     Clinical Impression Statement Pt is a 35 y/o female who presents to OPPT s/p Rt knee scope with microfracture on 01/18/21.  She demonstrates decreased strength, ROM, increased edema and gait abnormalities affecting functional mobility.  Pt will benefit from PT  to address deficits listed.    Personal Factors and Comorbidities Comorbidity 1    Comorbidities lupus    Examination-Activity Limitations Bend;Squat;Stairs;Stand;Transfers;Lift;Locomotion Level    Examination-Participation Restrictions Community Activity;Shop;Driving;Occupation    Stability/Clinical Decision Making Evolving/Moderate complexity    Clinical Decision Making Moderate    Rehab Potential Good    PT Frequency 2x / week    PT Duration 8 weeks    PT Treatment/Interventions ADLs/Self Care Home Management;Cryotherapy;Electrical Stimulation;Moist Heat;Balance training;Therapeutic exercise;Therapeutic activities;Functional mobility training;Stair training;Gait training;DME Instruction;Neuromuscular re-education;Patient/family education;Manual techniques;Passive range of motion;Vasopneumatic Device;Taping;Dry needling    PT Next Visit Plan review HEP, if she's still having trouble activating quad start with NMES, then progress to BFR; standing/gait training    PT Home Exercise Plan Access Code: [email or web address]    Consulted and Agree with Plan of Care Patient             Patient will benefit from skilled therapeutic intervention in order to improve the following deficits and impairments:  Abnormal gait, Decreased endurance, Pain, Decreased strength, Difficulty walking, Decreased mobility, Decreased balance, Increased edema, Decreased range of motion  Visit Diagnosis: Acute pain of right knee - Plan: PT plan of care cert/re-cert  Stiffness of right knee, not elsewhere classified - Plan: PT plan of care cert/re-cert  Other abnormalities of gait and mobility - Plan: PT plan of care cert/re-cert  Localized edema - Plan: PT plan of care cert/re-cert  Muscle weakness (generalized) - Plan: PT plan of care cert/re-cert     Problem List Patient Active Problem List   Diagnosis Date Noted   Lupus nephritis, ISN/RPS class V (HCC) 08/10/2020   Systemic lupus erythematosus (SLE) in adult (HCC)  11/12/2019   Other proteinuria 11/12/2019   Vitamin D deficiency 11/12/2019   History of asthma 11/12/2019   Seasonal allergies 11/12/2019   Former smoker 11/12/2019     Clarita Crane, PT, DPT 02/23/21 1:06 PM     [facility] Physical Therapy [address] [place], [place], [address] Phone: [phone]   Fax:  [phone]  Name: DAYANE HILLENBURG MRN: [id number] Date of Birth: 11-Jul-1985

## 2021-02-28 ENCOUNTER — Encounter: Payer: PRIVATE HEALTH INSURANCE | Admitting: Physical Therapy

## 2021-02-28 ENCOUNTER — Other Ambulatory Visit: Payer: Self-pay

## 2021-02-28 ENCOUNTER — Ambulatory Visit (INDEPENDENT_AMBULATORY_CARE_PROVIDER_SITE_OTHER): Payer: PRIVATE HEALTH INSURANCE | Admitting: Rehabilitative and Restorative Service Providers"

## 2021-02-28 ENCOUNTER — Encounter: Payer: Self-pay | Admitting: Rehabilitative and Restorative Service Providers"

## 2021-02-28 DIAGNOSIS — R600 Localized edema: Secondary | ICD-10-CM

## 2021-02-28 DIAGNOSIS — M25561 Pain in right knee: Secondary | ICD-10-CM | POA: Diagnosis not present

## 2021-02-28 DIAGNOSIS — R2689 Other abnormalities of gait and mobility: Secondary | ICD-10-CM | POA: Diagnosis not present

## 2021-02-28 DIAGNOSIS — M25661 Stiffness of right knee, not elsewhere classified: Secondary | ICD-10-CM

## 2021-02-28 DIAGNOSIS — M6281 Muscle weakness (generalized): Secondary | ICD-10-CM

## 2021-02-28 NOTE — Therapy (Signed)
[facility] Physical Therapy [address] [place], [place], [address] Phone: [phone]   Fax:  [phone]  Physical Therapy Treatment  Patient Details  Name: Kristin Murphy MRN: [id number] Date of Birth: 1985-11-12 Referring Provider (PT): Julieanne Cotton, [place]   Encounter Date: 02/28/2021   PT End of Session - 02/28/21 0925     Visit Number 2    Number of Visits 16    Date for PT Re-Evaluation 04/20/21    PT Start Time 0926    PT Stop Time 1005    PT Time Calculation (min) 39 min    Activity Tolerance Patient tolerated treatment well    Behavior During Therapy [facility] for tasks assessed/performed             Past Medical History:  Diagnosis Date   Systemic lupus erythematosus (HCC)     Past Surgical History:  Procedure Laterality Date   RENAL BIOPSY     WRIST SURGERY     biopsy    There were no vitals filed for this visit.   Subjective Assessment - 02/28/21 0944     Subjective Pt. real pain complaints indicated.  Some discomfort noted in anterior knee c extension attempts at times.  Trying some ambulation without crutches around house.    Limitations Standing;Walking    Patient Stated Goals improve mobility. strength, return to work    Currently in Pain? No/denies    Pain Score 0-No pain    Pain Onset More than a month ago                               [facility] Adult PT Treatment/Exercise - 02/28/21 0001       Ambulation/Gait   Gait Comments single axillary crutch demonstration and cues (quick learning and good replication noted)      Blood Flow Restriction   Blood Flow Restriction Yes      Blood Flow Restriction-Positions    Blood Flow Restriction Position Supine;Sitting      BFR-Supine   Supine Limb Occulsion Pressure (mmHg) 205    Supine Exercise Pressure (mmHg) 154   755   Supine Exercise Prescription Comment cuff size 5      BFR Sitting   Sitting Limb Occulsion Pressure (mmHg) 205    Sitting  Exercise Pressure (mmHg) 154   75%   Sitting Exercise Prescription Comment cuff size 5      Exercises   Exercises Knee/Hip      Knee/Hip Exercises: Aerobic   Recumbent Bike Lvl 3 5 mins      Knee/Hip Exercises: Seated   Long Arc Quad Right   BFR 154 mmHg x16 (fatigue) , 3 x 15 30 sec breaks (full range as possible)   Sit to Sand without UE support;3 sets;10 reps   BFR 154 mmHg from 18 inch table height, feet even.  Fast up, slow down     Knee/Hip Exercises: Supine   Quad Sets Right   BFR 153 mmHg 5 sec on/off 5 mins                      PT Short Term Goals - 02/28/21 0945       PT SHORT TERM GOAL #1   Title Independent with initial HEP    Time 4    Period Weeks    Status On-going    Target Date 03/23/21      PT  SHORT TERM GOAL #2   Title Rt knee AROM improved 0-10-120 for improved function    Time 4    Period Weeks    Status On-going    Target Date 03/23/21               PT Long Term Goals - 02/23/21 1302       PT LONG TERM GOAL #1   Title Independent with final HEP    Time 8    Period Weeks    Status New    Target Date 04/20/21      PT LONG TERM GOAL #2   Title Rt knee AROM improved 0-125 for improved function    Time 8    Period Weeks    Status New    Target Date 04/20/21      PT LONG TERM GOAL #3   Title Amb independently without significant deviations for improved function    Time 8    Period Weeks    Status New    Target Date 04/20/21      PT LONG TERM GOAL #4   Title Report pain < 2/10 for improved function    Time 8    Period Weeks    Status New    Target Date 04/20/21      PT LONG TERM GOAL #5   Title FOTO score improved to 71 for improved function    Time 8    Period Weeks    Status New    Target Date 04/20/21                   Plan - 02/28/21 0945     Clinical Impression Statement Improved capacity and quality of extension recruitment in quad compared to previous.  Good response to use of BFR c no  adverse reactions.  Continued skilled PT services warranted.  Unable to sustain supine slr today.    Personal Factors and Comorbidities Comorbidity 1    Comorbidities lupus    Examination-Activity Limitations Bend;Squat;Stairs;Stand;Transfers;Lift;Locomotion Level    Examination-Participation Restrictions Community Activity;Shop;Driving;Occupation    Stability/Clinical Decision Making Evolving/Moderate complexity    Rehab Potential Good    PT Frequency 2x / week    PT Duration 8 weeks    PT Treatment/Interventions ADLs/Self Care Home Management;Cryotherapy;Electrical Stimulation;Moist Heat;Balance training;Therapeutic exercise;Therapeutic activities;Functional mobility training;Stair training;Gait training;DME Instruction;Neuromuscular re-education;Patient/family education;Manual techniques;Passive range of motion;Vasopneumatic Device;Taping;Dry needling    PT Next Visit Plan BFR, NMES as needed, Static balance interventions compliant and non compliant surfaces.    PT Home Exercise Plan Access Code: [email or web address]    Consulted and Agree with Plan of Care Patient             Patient will benefit from skilled therapeutic intervention in order to improve the following deficits and impairments:  Abnormal gait, Decreased endurance, Pain, Decreased strength, Difficulty walking, Decreased mobility, Decreased balance, Increased edema, Decreased range of motion  Visit Diagnosis: Acute pain of right knee  Stiffness of right knee, not elsewhere classified  Other abnormalities of gait and mobility  Localized edema  Muscle weakness (generalized)     Problem List Patient Active Problem List   Diagnosis Date Noted   Lupus nephritis, ISN/RPS class V (HCC) 08/10/2020   Systemic lupus erythematosus (SLE) in adult (HCC) 11/12/2019   Other proteinuria 11/12/2019   Vitamin D deficiency 11/12/2019   History of asthma 11/12/2019   Seasonal allergies 11/12/2019   Former smoker 11/12/2019    Chyrel Masson, PT, DPT,  OCS, ATC 02/28/21  10:14 AM    [facility] Physical Therapy [address] [place], [place], [address] Phone: [phone]   Fax:  [phone]  Name: Kristin Murphy MRN: [id number] Date of Birth: May 25, 1986

## 2021-03-02 ENCOUNTER — Ambulatory Visit (INDEPENDENT_AMBULATORY_CARE_PROVIDER_SITE_OTHER): Payer: PRIVATE HEALTH INSURANCE | Admitting: Rehabilitative and Restorative Service Providers"

## 2021-03-02 ENCOUNTER — Other Ambulatory Visit: Payer: Self-pay

## 2021-03-02 DIAGNOSIS — R600 Localized edema: Secondary | ICD-10-CM

## 2021-03-02 DIAGNOSIS — M25561 Pain in right knee: Secondary | ICD-10-CM

## 2021-03-02 DIAGNOSIS — R2689 Other abnormalities of gait and mobility: Secondary | ICD-10-CM | POA: Diagnosis not present

## 2021-03-02 DIAGNOSIS — M6281 Muscle weakness (generalized): Secondary | ICD-10-CM

## 2021-03-02 DIAGNOSIS — M25661 Stiffness of right knee, not elsewhere classified: Secondary | ICD-10-CM | POA: Diagnosis not present

## 2021-03-02 NOTE — Therapy (Signed)
[facility] Physical Therapy [address] [place], [place], [address] Phone: [phone]   Fax:  [phone]  Physical Therapy Treatment  Patient Details  Name: Kristin Murphy MRN: [id number] Date of Birth: June 25, 1985 Referring Provider (PT): Julieanne Cotton, [place]   Encounter Date: 03/02/2021   PT End of Session - 03/02/21 0914     Visit Number 3    Number of Visits 16    Date for PT Re-Evaluation 04/20/21    PT Start Time 0845    PT Stop Time 0925    PT Time Calculation (min) 40 min    Activity Tolerance Patient tolerated treatment well    Behavior During Therapy [facility] for tasks assessed/performed             Past Medical History:  Diagnosis Date   Systemic lupus erythematosus (HCC)     Past Surgical History:  Procedure Laterality Date   RENAL BIOPSY     WRIST SURGERY     biopsy    There were no vitals filed for this visit.   Subjective Assessment - 03/02/21 0912     Subjective Pt. stated no pain upon arrival with some complaints at times in front of knee noted still.  Was using single crutch at times but had to go back to two crutches due to instability at times.    Limitations Standing;Walking    Patient Stated Goals improve mobility. strength, return to work    Currently in Pain? No/denies    Pain Score 0-No pain    Pain Onset More than a month ago                               [facility] Adult PT Treatment/Exercise - 03/02/21 0001       Neuro Re-ed    Neuro Re-ed Details  tandem stance on foam 1 min x 90 seconds bilateral, retro step on Rt leg 20x      Blood Flow Restriction   Blood Flow Restriction Yes      Blood Flow Restriction-Positions    Blood Flow Restriction Position Supine;Sitting      BFR Sitting   Sitting Exercise Pressure (mmHg) 154    Sitting Exercise Prescription Comment cuff size 5      Knee/Hip Exercises: Machines for Strengthening   Total Gym Leg Press Double leg 75 lbs 2 x 15, Rt leg  37 lbs 2 x 15      Knee/Hip Exercises: Seated   Long Arc Quad Right   BFR 154 mmHg 30, 3 x 15 c 30 sec rests (add weight next visit 1-3 lbs range)   Sit to Sand --   held due to complaints about cuff today     Knee/Hip Exercises: Supine   Quad Sets Right   BFR 154 mmHg 5 sec hold x 30                      PT Short Term Goals - 02/28/21 0945       PT SHORT TERM GOAL #1   Title Independent with initial HEP    Time 4    Period Weeks    Status On-going    Target Date 03/23/21      PT SHORT TERM GOAL #2   Title Rt knee AROM improved 0-10-120 for improved function    Time 4    Period Weeks    Status On-going  Target Date 03/23/21               PT Long Term Goals - 02/23/21 1302       PT LONG TERM GOAL #1   Title Independent with final HEP    Time 8    Period Weeks    Status New    Target Date 04/20/21      PT LONG TERM GOAL #2   Title Rt knee AROM improved 0-125 for improved function    Time 8    Period Weeks    Status New    Target Date 04/20/21      PT LONG TERM GOAL #3   Title Amb independently without significant deviations for improved function    Time 8    Period Weeks    Status New    Target Date 04/20/21      PT LONG TERM GOAL #4   Title Report pain < 2/10 for improved function    Time 8    Period Weeks    Status New    Target Date 04/20/21      PT LONG TERM GOAL #5   Title FOTO score improved to 71 for improved function    Time 8    Period Weeks    Status New    Target Date 04/20/21                   Plan - 03/02/21 0909     Clinical Impression Statement Pt. had some discomfort/pain complaints around cuff application site that reduced some exercise intervention use.  Will monitor and adjust going forward based off symptoms.    Personal Factors and Comorbidities Comorbidity 1    Comorbidities lupus    Examination-Activity Limitations Bend;Squat;Stairs;Stand;Transfers;Lift;Locomotion Level     Examination-Participation Restrictions Community Activity;Shop;Driving;Occupation    Stability/Clinical Decision Making Evolving/Moderate complexity    Rehab Potential Good    PT Frequency 2x / week    PT Duration 8 weeks    PT Treatment/Interventions ADLs/Self Care Home Management;Cryotherapy;Electrical Stimulation;Moist Heat;Balance training;Therapeutic exercise;Therapeutic activities;Functional mobility training;Stair training;Gait training;DME Instruction;Neuromuscular re-education;Patient/family education;Manual techniques;Passive range of motion;Vasopneumatic Device;Taping;Dry needling    PT Next Visit Plan BFR as tolerated, Static balance interventions compliant and non compliant surfaces continued c progressive quad strengthening.    PT Home Exercise Plan Access Code: [email or web address]    Consulted and Agree with Plan of Care Patient             Patient will benefit from skilled therapeutic intervention in order to improve the following deficits and impairments:  Abnormal gait, Decreased endurance, Pain, Decreased strength, Difficulty walking, Decreased mobility, Decreased balance, Increased edema, Decreased range of motion  Visit Diagnosis: Acute pain of right knee  Stiffness of right knee, not elsewhere classified  Other abnormalities of gait and mobility  Localized edema  Muscle weakness (generalized)     Problem List Patient Active Problem List   Diagnosis Date Noted   Lupus nephritis, ISN/RPS class V (HCC) 08/10/2020   Systemic lupus erythematosus (SLE) in adult (HCC) 11/12/2019   Other proteinuria 11/12/2019   Vitamin D deficiency 11/12/2019   History of asthma 11/12/2019   Seasonal allergies 11/12/2019   Former smoker 11/12/2019   Chyrel Masson, PT, DPT, OCS, ATC 03/02/21  9:27 AM    [facility] Physical Therapy [address] [place], [place], [address] Phone: [phone]   Fax:  [phone]  Name: Kristin Murphy MRN: [id number] Date  of Birth: 25-Aug-1985

## 2021-03-03 ENCOUNTER — Other Ambulatory Visit: Payer: Self-pay | Admitting: *Deleted

## 2021-03-03 DIAGNOSIS — E559 Vitamin D deficiency, unspecified: Secondary | ICD-10-CM

## 2021-03-03 DIAGNOSIS — M329 Systemic lupus erythematosus, unspecified: Secondary | ICD-10-CM

## 2021-03-03 DIAGNOSIS — Z79899 Other long term (current) drug therapy: Secondary | ICD-10-CM

## 2021-03-03 DIAGNOSIS — M3214 Glomerular disease in systemic lupus erythematosus: Secondary | ICD-10-CM

## 2021-03-04 LAB — PROTEIN / CREATININE RATIO, URINE
Creatinine, Urine: 173 mg/dL (ref 20–275)
Protein/Creat Ratio: 260 mg/g creat — ABNORMAL HIGH (ref 24–184)
Protein/Creatinine Ratio: 0.26 mg/mg creat — ABNORMAL HIGH (ref 0.024–0.184)
Total Protein, Urine: 45 mg/dL — ABNORMAL HIGH (ref 5–24)

## 2021-03-04 LAB — CBC WITH DIFFERENTIAL/PLATELET
Absolute Monocytes: 405 cells/uL (ref 200–950)
Basophils Absolute: 30 cells/uL (ref 0–200)
Basophils Relative: 1 %
Eosinophils Absolute: 189 cells/uL (ref 15–500)
Eosinophils Relative: 6.3 %
HCT: 39.3 % (ref 35.0–45.0)
Hemoglobin: 12.6 g/dL (ref 11.7–15.5)
Lymphs Abs: 1248 cells/uL (ref 850–3900)
MCH: 29.3 pg (ref 27.0–33.0)
MCHC: 32.1 g/dL (ref 32.0–36.0)
MCV: 91.4 fL (ref 80.0–100.0)
MPV: 11.9 fL (ref 7.5–12.5)
Monocytes Relative: 13.5 %
Neutro Abs: 1128 cells/uL — ABNORMAL LOW (ref 1500–7800)
Neutrophils Relative %: 37.6 %
Platelets: 222 10*3/uL (ref 140–400)
RBC: 4.3 10*6/uL (ref 3.80–5.10)
RDW: 13.5 % (ref 11.0–15.0)
Total Lymphocyte: 41.6 %
WBC: 3 10*3/uL — ABNORMAL LOW (ref 3.8–10.8)

## 2021-03-04 LAB — URINALYSIS, ROUTINE W REFLEX MICROSCOPIC
Bilirubin Urine: NEGATIVE
Glucose, UA: NEGATIVE
Hgb urine dipstick: NEGATIVE
Hyaline Cast: NONE SEEN /LPF
Ketones, ur: NEGATIVE
Leukocytes,Ua: NEGATIVE
Nitrite: NEGATIVE
Specific Gravity, Urine: 1.021 (ref 1.001–1.035)
pH: 6.5 (ref 5.0–8.0)

## 2021-03-04 LAB — COMPLETE METABOLIC PANEL WITH GFR
AG Ratio: 1.1 (calc) (ref 1.0–2.5)
ALT: 6 U/L (ref 6–29)
AST: 14 U/L (ref 10–30)
Albumin: 4.1 g/dL (ref 3.6–5.1)
Alkaline phosphatase (APISO): 61 U/L (ref 31–125)
BUN: 12 mg/dL (ref 7–25)
CO2: 27 mmol/L (ref 20–32)
Calcium: 9.2 mg/dL (ref 8.6–10.2)
Chloride: 106 mmol/L (ref 98–110)
Creat: 0.73 mg/dL (ref 0.50–0.97)
Globulin: 3.6 g/dL (calc) (ref 1.9–3.7)
Glucose, Bld: 70 mg/dL (ref 65–99)
Potassium: 4 mmol/L (ref 3.5–5.3)
Sodium: 140 mmol/L (ref 135–146)
Total Bilirubin: 0.3 mg/dL (ref 0.2–1.2)
Total Protein: 7.7 g/dL (ref 6.1–8.1)
eGFR: 109 mL/min/{1.73_m2} (ref >=60)

## 2021-03-04 LAB — ANTI-DNA ANTIBODY, DOUBLE-STRANDED: ds DNA Ab: 1 IU/mL

## 2021-03-04 LAB — SEDIMENTATION RATE: Sed Rate: 28 mm/h — ABNORMAL HIGH (ref 0–20)

## 2021-03-04 LAB — MICROSCOPIC MESSAGE

## 2021-03-04 LAB — VITAMIN D 25 HYDROXY (VIT D DEFICIENCY, FRACTURES): Vit D, 25-Hydroxy: 38 ng/mL (ref 30–100)

## 2021-03-04 LAB — C3 AND C4
C3 Complement: 94 mg/dL (ref 83–193)
C4 Complement: 20 mg/dL (ref 15–57)

## 2021-03-04 NOTE — Progress Notes (Signed)
Protein creatinine ratio is mildly increased.  Vitamin D is normal.  Sedimentation rate is mildly increased but better than the previous value complements are normal.  Urine shows 1+ protein CMP is normal.  White cell count is low and stable.  Please notify patient and forward results to her nephrologist.

## 2021-03-08 ENCOUNTER — Encounter: Payer: Self-pay | Admitting: Physical Therapy

## 2021-03-08 ENCOUNTER — Other Ambulatory Visit: Payer: Self-pay

## 2021-03-08 ENCOUNTER — Ambulatory Visit (INDEPENDENT_AMBULATORY_CARE_PROVIDER_SITE_OTHER): Payer: PRIVATE HEALTH INSURANCE | Admitting: Physical Therapy

## 2021-03-08 DIAGNOSIS — R600 Localized edema: Secondary | ICD-10-CM | POA: Diagnosis not present

## 2021-03-08 DIAGNOSIS — R2689 Other abnormalities of gait and mobility: Secondary | ICD-10-CM

## 2021-03-08 DIAGNOSIS — M25561 Pain in right knee: Secondary | ICD-10-CM | POA: Diagnosis not present

## 2021-03-08 DIAGNOSIS — M25661 Stiffness of right knee, not elsewhere classified: Secondary | ICD-10-CM

## 2021-03-08 DIAGNOSIS — M6281 Muscle weakness (generalized): Secondary | ICD-10-CM

## 2021-03-08 NOTE — Therapy (Signed)
[facility] Physical Therapy [address] [place], [place], [address] Phone: [phone]   Fax:  [phone]  Physical Therapy Treatment  Patient Details  Name: LINDSEE LABARRE MRN: [id number] Date of Birth: Sep 26, 1985 Referring Provider (PT): Julieanne Cotton, [place]   Encounter Date: 03/08/2021   PT End of Session - 03/08/21 1300     Visit Number 4    Number of Visits 16    Date for PT Re-Evaluation 04/20/21    PT Start Time 1300    PT Stop Time 1344    PT Time Calculation (min) 44 min    Activity Tolerance Patient tolerated treatment well    Behavior During Therapy [facility] for tasks assessed/performed             Past Medical History:  Diagnosis Date   Systemic lupus erythematosus (HCC)     Past Surgical History:  Procedure Laterality Date   RENAL BIOPSY     WRIST SURGERY     biopsy    There were no vitals filed for this visit.   Subjective Assessment - 03/08/21 1300     Subjective She reports doing her exercises and riding stationary bike without resistance for 20 min.    Limitations Standing;Walking    Patient Stated Goals improve mobility. strength, return to work    Currently in Pain? No/denies    Pain Onset More than a month ago                               [facility] Adult PT Treatment/Exercise - 03/08/21 1300       Ambulation/Gait   Ambulation/Gait Yes    Ambulation/Gait Assistance 5: Supervision    Ambulation/Gait Assistance Details worked on equal stance duration. she required slow gait speed to focus on Rt knee control    Assistive device None      Neuro Re-ed    Neuro Re-ed Details  standing SLS on RLE rolling tennis ball ant/post, circles CW & CCW 15 reps ea 2 sets      Blood Flow Restriction   Blood Flow Restriction Yes      Blood Flow Restriction-Positions    Blood Flow Restriction Position Supine;Sitting      BFR Sitting   Sitting Exercise Pressure (mmHg) 154    Sitting Exercise Prescription  30,15,15,15, reps w/ 30-60 sec rest    Sitting Exercise Prescription Comment cuff size 5      Knee/Hip Exercises: Stretches   Active Hamstring Stretch Right;1 rep;30 seconds    Active Hamstring Stretch Limitations standing with RLE extended & trunk flexion    Gastroc Stretch Right;1 rep;30 seconds    Gastroc Stretch Limitations standing step w/ heel depression      Knee/Hip Exercises: Aerobic   Stationary Bike seat 8 level 2 for 8 minutes      Knee/Hip Exercises: Machines for Strengthening   Total Gym Leg Press LLE 37# 30-15-15-15 with BFR  back flat to facilitate hip ext / knee ext      Knee/Hip Exercises: Seated   Long Arc Quad Right   BFR 154 mmHg 30, 3 x 15 c 30 sec rests   Long Arc Quad Weight 1 lbs.                       PT Short Term Goals - 02/28/21 0945       PT SHORT TERM GOAL #1   Title  Independent with initial HEP    Time 4    Period Weeks    Status On-going    Target Date 03/23/21      PT SHORT TERM GOAL #2   Title Rt knee AROM improved 0-10-120 for improved function    Time 4    Period Weeks    Status On-going    Target Date 03/23/21               PT Long Term Goals - 02/23/21 1302       PT LONG TERM GOAL #1   Title Independent with final HEP    Time 8    Period Weeks    Status New    Target Date 04/20/21      PT LONG TERM GOAL #2   Title Rt knee AROM improved 0-125 for improved function    Time 8    Period Weeks    Status New    Target Date 04/20/21      PT LONG TERM GOAL #3   Title Amb independently without significant deviations for improved function    Time 8    Period Weeks    Status New    Target Date 04/20/21      PT LONG TERM GOAL #4   Title Report pain < 2/10 for improved function    Time 8    Period Weeks    Status New    Target Date 04/20/21      PT LONG TERM GOAL #5   Title FOTO score improved to 71 for improved function    Time 8    Period Weeks    Status New    Target Date 04/20/21                    Plan - 03/08/21 1306     Clinical Impression Statement PT alternated exercises with & without BFR which she tolerated well today.  PT worked on gait without device but required slow speed for equal stance duration.    Personal Factors and Comorbidities Comorbidity 1    Comorbidities lupus    Examination-Activity Limitations Bend;Squat;Stairs;Stand;Transfers;Lift;Locomotion Level    Examination-Participation Restrictions Community Activity;Shop;Driving;Occupation    Stability/Clinical Decision Making Evolving/Moderate complexity    Rehab Potential Good    PT Frequency 2x / week    PT Duration 8 weeks    PT Treatment/Interventions ADLs/Self Care Home Management;Cryotherapy;Electrical Stimulation;Moist Heat;Balance training;Therapeutic exercise;Therapeutic activities;Functional mobility training;Stair training;Gait training;DME Instruction;Neuromuscular re-education;Patient/family education;Manual techniques;Passive range of motion;Vasopneumatic Device;Taping;Dry needling    PT Next Visit Plan BFR as tolerated, Static balance interventions compliant and non compliant surfaces continued c progressive quad strengthening.    PT Home Exercise Plan Access Code: [email or web address]    Consulted and Agree with Plan of Care Patient             Patient will benefit from skilled therapeutic intervention in order to improve the following deficits and impairments:  Abnormal gait, Decreased endurance, Pain, Decreased strength, Difficulty walking, Decreased mobility, Decreased balance, Increased edema, Decreased range of motion  Visit Diagnosis: Acute pain of right knee  Stiffness of right knee, not elsewhere classified  Other abnormalities of gait and mobility  Localized edema  Muscle weakness (generalized)     Problem List Patient Active Problem List   Diagnosis Date Noted   Lupus nephritis, ISN/RPS class V (HCC) 08/10/2020   Systemic lupus erythematosus (SLE) in adult [facility])  11/12/2019   Other proteinuria 11/12/2019   Vitamin  D deficiency 11/12/2019   History of asthma 11/12/2019   Seasonal allergies 11/12/2019   Former smoker 11/12/2019    Vladimir Faster, PT, DPT 03/08/2021, 1:47 PM  [facility] Physical Therapy [address] [place], [place], [address] Phone: [phone]   Fax:  [phone]  Name: MAGHAN JESSEE MRN: [id number] Date of Birth: 1986-04-25

## 2021-03-10 ENCOUNTER — Encounter: Payer: PRIVATE HEALTH INSURANCE | Admitting: Physical Therapy

## 2021-03-10 ENCOUNTER — Telehealth: Payer: Self-pay | Admitting: Physical Therapy

## 2021-03-10 NOTE — Telephone Encounter (Signed)
Tried to call pt as she did not show for PT appt.  VM full, unable to leave a message.  Clarita Crane, PT, DPT 03/10/21 9:50 AM

## 2021-03-15 ENCOUNTER — Encounter: Payer: Self-pay | Admitting: Physical Therapy

## 2021-03-15 ENCOUNTER — Other Ambulatory Visit: Payer: Self-pay

## 2021-03-15 ENCOUNTER — Ambulatory Visit (INDEPENDENT_AMBULATORY_CARE_PROVIDER_SITE_OTHER): Payer: PRIVATE HEALTH INSURANCE | Admitting: Physical Therapy

## 2021-03-15 DIAGNOSIS — M6281 Muscle weakness (generalized): Secondary | ICD-10-CM

## 2021-03-15 DIAGNOSIS — R600 Localized edema: Secondary | ICD-10-CM | POA: Diagnosis not present

## 2021-03-15 DIAGNOSIS — M25661 Stiffness of right knee, not elsewhere classified: Secondary | ICD-10-CM | POA: Diagnosis not present

## 2021-03-15 DIAGNOSIS — R2689 Other abnormalities of gait and mobility: Secondary | ICD-10-CM

## 2021-03-15 DIAGNOSIS — M25561 Pain in right knee: Secondary | ICD-10-CM

## 2021-03-15 NOTE — Therapy (Signed)
[facility] Physical Therapy [address] [place], [place], [address] Phone: [phone]   Fax:  [phone]  Physical Therapy Treatment  Patient Details  Name: Kristin Murphy MRN: [id number] Date of Birth: 12-Mar-1986 Referring Provider (PT): Julieanne Cotton, [place]   Encounter Date: 03/15/2021   PT End of Session - 03/15/21 1009     Visit Number 5    Number of Visits 16    Date for PT Re-Evaluation 04/20/21    PT Start Time 0929    PT Stop Time 1009    PT Time Calculation (min) 40 min    Activity Tolerance Patient tolerated treatment well    Behavior During Therapy [facility] for tasks assessed/performed             Past Medical History:  Diagnosis Date   Systemic lupus erythematosus (HCC)     Past Surgical History:  Procedure Laterality Date   RENAL BIOPSY     WRIST SURGERY     biopsy    There were no vitals filed for this visit.   Subjective Assessment - 03/15/21 0928     Subjective doing well - limps with walking longer distances    Limitations Standing;Walking    Patient Stated Goals improve mobility. strength, return to work    Currently in Pain? No/denies                               [facility] Adult PT Treatment/Exercise - 03/15/21 0933       BFR Sitting   Sitting Exercise Pressure (mmHg) 154    Sitting Exercise Prescription 30,15,15,15, reps w/ 30-60 sec rest    Sitting Exercise Prescription Comment cuff size 5      Knee/Hip Exercises: Aerobic   Recumbent Bike L4 x 8 min      Knee/Hip Exercises: Machines for Strengthening   Cybex Knee Extension 5# bil concentric, RLE eccentric 2x10    Total Gym Leg Press RLE only 50# with BFR      Knee/Hip Exercises: Seated   Long Arc Quad Right   BFR 154 mmHg 30, 3 x 15 c 30 sec rests   Long Arc Quad Weight 3 lbs.                       PT Short Term Goals - 02/28/21 0945       PT SHORT TERM GOAL #1   Title Independent with initial HEP    Time 4     Period Weeks    Status On-going    Target Date 03/23/21      PT SHORT TERM GOAL #2   Title Rt knee AROM improved 0-10-120 for improved function    Time 4    Period Weeks    Status On-going    Target Date 03/23/21               PT Long Term Goals - 02/23/21 1302       PT LONG TERM GOAL #1   Title Independent with final HEP    Time 8    Period Weeks    Status New    Target Date 04/20/21      PT LONG TERM GOAL #2   Title Rt knee AROM improved 0-125 for improved function    Time 8    Period Weeks    Status New    Target Date 04/20/21  PT LONG TERM GOAL #3   Title Amb independently without significant deviations for improved function    Time 8    Period Weeks    Status New    Target Date 04/20/21      PT LONG TERM GOAL #4   Title Report pain < 2/10 for improved function    Time 8    Period Weeks    Status New    Target Date 04/20/21      PT LONG TERM GOAL #5   Title FOTO score improved to 71 for improved function    Time 8    Period Weeks    Status New    Target Date 04/20/21                   Plan - 03/15/21 1010     Clinical Impression Statement Pt tolerated session well today with visible quad shake and fatigue at end of session.  Continues to demonstrate improved quad activation with progressive improvement in functional mobility.  Will cotninue to benefit from PT to maximize function.    Personal Factors and Comorbidities Comorbidity 1    Comorbidities lupus    Examination-Activity Limitations Bend;Squat;Stairs;Stand;Transfers;Lift;Locomotion Level    Examination-Participation Restrictions Community Activity;Shop;Driving;Occupation    Stability/Clinical Decision Making Evolving/Moderate complexity    Rehab Potential Good    PT Frequency 2x / week    PT Duration 8 weeks    PT Treatment/Interventions ADLs/Self Care Home Management;Cryotherapy;Electrical Stimulation;Moist Heat;Balance training;Therapeutic exercise;Therapeutic  activities;Functional mobility training;Stair training;Gait training;DME Instruction;Neuromuscular re-education;Patient/family education;Manual techniques;Passive range of motion;Vasopneumatic Device;Taping;Dry needling    PT Next Visit Plan BFR as tolerated, Static balance interventions compliant and non compliant surfaces continued c progressive quad strengthening.  Check STGs/measure    PT Home Exercise Plan Access Code: [email or web address]    Consulted and Agree with Plan of Care Patient             Patient will benefit from skilled therapeutic intervention in order to improve the following deficits and impairments:  Abnormal gait, Decreased endurance, Pain, Decreased strength, Difficulty walking, Decreased mobility, Decreased balance, Increased edema, Decreased range of motion  Visit Diagnosis: Acute pain of right knee  Stiffness of right knee, not elsewhere classified  Other abnormalities of gait and mobility  Localized edema  Muscle weakness (generalized)     Problem List Patient Active Problem List   Diagnosis Date Noted   Lupus nephritis, ISN/RPS class V (HCC) 08/10/2020   Systemic lupus erythematosus (SLE) in adult (HCC) 11/12/2019   Other proteinuria 11/12/2019   Vitamin D deficiency 11/12/2019   History of asthma 11/12/2019   Seasonal allergies 11/12/2019   Former smoker 11/12/2019      Kristin Murphy, PT, DPT 03/15/21 10:13 AM     [facility] Physical Therapy [address] [place], [place], [address] Phone: [phone]   Fax:  [phone]  Name: Kristin Murphy MRN: [id number] Date of Birth: Jan 16, 1986

## 2021-03-17 ENCOUNTER — Ambulatory Visit (INDEPENDENT_AMBULATORY_CARE_PROVIDER_SITE_OTHER): Payer: PRIVATE HEALTH INSURANCE | Admitting: Physical Therapy

## 2021-03-17 ENCOUNTER — Encounter: Payer: Self-pay | Admitting: Physical Therapy

## 2021-03-17 ENCOUNTER — Other Ambulatory Visit: Payer: Self-pay

## 2021-03-17 DIAGNOSIS — M25561 Pain in right knee: Secondary | ICD-10-CM

## 2021-03-17 DIAGNOSIS — M25661 Stiffness of right knee, not elsewhere classified: Secondary | ICD-10-CM

## 2021-03-17 DIAGNOSIS — R2689 Other abnormalities of gait and mobility: Secondary | ICD-10-CM

## 2021-03-17 DIAGNOSIS — R600 Localized edema: Secondary | ICD-10-CM | POA: Diagnosis not present

## 2021-03-17 DIAGNOSIS — M6281 Muscle weakness (generalized): Secondary | ICD-10-CM

## 2021-03-17 NOTE — Therapy (Signed)
[facility] Physical Therapy [address] [place], [place], [address] Phone: [phone]   Fax:  [phone]  Physical Therapy Treatment  Patient Details  Name: Kristin Murphy MRN: [id number] Date of Birth: 07-Jan-1986 Referring Provider (PT): Julieanne Cotton, [place]   Encounter Date: 03/17/2021   PT End of Session - 03/17/21 0263     Visit Number 6    Number of Visits 16    Date for PT Re-Evaluation 04/20/21    PT Start Time 0929    PT Stop Time 1021    PT Time Calculation (min) 52 min    Activity Tolerance Patient tolerated treatment well    Behavior During Therapy [facility] for tasks assessed/performed             Past Medical History:  Diagnosis Date   Systemic lupus erythematosus (HCC)     Past Surgical History:  Procedure Laterality Date   RENAL BIOPSY     WRIST SURGERY     biopsy    There were no vitals filed for this visit.   Subjective Assessment - 03/17/21 0929     Subjective Her exercises are going well and she is going to [facility] keeping reps & wts same as in PT.    Limitations Standing;Walking    Patient Stated Goals improve mobility. strength, return to work                [facility] PT Assessment - 03/17/21 0930       Assessment   Medical Diagnosis Z98.890 (ICD-10-CM) - Status post arthroscopy of right knee    Referring Provider (PT) Magnant, Joycie Peek, PA-C    Onset Date/Surgical Date 01/18/21      AROM   Right Knee Extension -17   Seated LAQ   Right Knee Flexion 118   seated                          [facility] Adult PT Treatment/Exercise - 03/17/21 0929       Neuro Re-ed    Neuro Re-ed Details  standing RLE on foam reaching LLE Y motions 10 reps.      BFR Sitting   Sitting Exercise Pressure (mmHg) 154    Sitting Exercise Prescription 30,15,15,15, reps w/ 30-60 sec rest    Sitting Exercise Prescription Comment cuff size 5      Knee/Hip Exercises: Aerobic   Recumbent Bike L4 x 8 min seat 6       Knee/Hip Exercises: Machines for Strengthening   Cybex Knee Extension 5# bil concentric, RLE eccentric 2x10    Total Gym Leg Press RLE only 50# with BFR 30-15-15-15      Knee/Hip Exercises: Seated   Long Arc Quad Right   BFR 154 mmHg 30, 3 x 15 c 30 sec rests   Long Arc Quad Weight 3 lbs.    Long Arc Quad Limitations using strap to assist end range extension                       PT Short Term Goals - 03/17/21 1001       PT SHORT TERM GOAL #1   Title Independent with initial HEP    Baseline 03/17/2021 achieved to date PT cont to advance as indicated    Time 4    Period Weeks    Status Achieved    Target Date 03/23/21      PT SHORT TERM GOAL #2  Title Rt knee AROM improved 0-10-120 for improved function    Time 4    Period Weeks    Status On-going    Target Date 03/23/21               PT Long Term Goals - 02/23/21 1302       PT LONG TERM GOAL #1   Title Independent with final HEP    Time 8    Period Weeks    Status New    Target Date 04/20/21      PT LONG TERM GOAL #2   Title Rt knee AROM improved 0-125 for improved function    Time 8    Period Weeks    Status New    Target Date 04/20/21      PT LONG TERM GOAL #3   Title Amb independently without significant deviations for improved function    Time 8    Period Weeks    Status New    Target Date 04/20/21      PT LONG TERM GOAL #4   Title Report pain < 2/10 for improved function    Time 8    Period Weeks    Status New    Target Date 04/20/21      PT LONG TERM GOAL #5   Title FOTO score improved to 71 for improved function    Time 8    Period Weeks    Status New    Target Date 04/20/21                   Plan - 03/17/21 4034     Clinical Impression Statement Patient has improved AROM but not quite to level of STG.  PT cued in use of strap & LLE on knee ext machine to work quad in full range which she appeared to understand.    Personal Factors and Comorbidities  Comorbidity 1    Comorbidities lupus    Examination-Activity Limitations Bend;Squat;Stairs;Stand;Transfers;Lift;Locomotion Level    Examination-Participation Restrictions Community Activity;Shop;Driving;Occupation    Stability/Clinical Decision Making Evolving/Moderate complexity    Rehab Potential Good    PT Frequency 2x / week    PT Duration 8 weeks    PT Treatment/Interventions ADLs/Self Care Home Management;Cryotherapy;Electrical Stimulation;Moist Heat;Balance training;Therapeutic exercise;Therapeutic activities;Functional mobility training;Stair training;Gait training;DME Instruction;Neuromuscular re-education;Patient/family education;Manual techniques;Passive range of motion;Vasopneumatic Device;Taping;Dry needling    PT Next Visit Plan BFR as tolerated, Static balance interventions compliant and non compliant surfaces continued c progressive quad strengthening.  Recheck STG/measure    PT Home Exercise Plan Access Code: [email or web address]    Consulted and Agree with Plan of Care Patient             Patient will benefit from skilled therapeutic intervention in order to improve the following deficits and impairments:  Abnormal gait, Decreased endurance, Pain, Decreased strength, Difficulty walking, Decreased mobility, Decreased balance, Increased edema, Decreased range of motion  Visit Diagnosis: Acute pain of right knee  Stiffness of right knee, not elsewhere classified  Other abnormalities of gait and mobility  Localized edema  Muscle weakness (generalized)     Problem List Patient Active Problem List   Diagnosis Date Noted   Lupus nephritis, ISN/RPS class V (HCC) 08/10/2020   Systemic lupus erythematosus (SLE) in adult (HCC) 11/12/2019   Other proteinuria 11/12/2019   Vitamin D deficiency 11/12/2019   History of asthma 11/12/2019   Seasonal allergies 11/12/2019   Former smoker 11/12/2019    Vladimir Faster, PT, DPT 03/17/2021,  10:20 AM  [facility] Physical  Therapy [address] [place], [place], [address] Phone: [phone]   Fax:  [phone]  Name: Kristin Murphy MRN: [id number] Date of Birth: [birth year]

## 2021-03-21 ENCOUNTER — Encounter: Payer: Self-pay | Admitting: Physical Therapy

## 2021-03-21 ENCOUNTER — Ambulatory Visit (INDEPENDENT_AMBULATORY_CARE_PROVIDER_SITE_OTHER): Payer: PRIVATE HEALTH INSURANCE | Admitting: Orthopedic Surgery

## 2021-03-21 ENCOUNTER — Other Ambulatory Visit: Payer: Self-pay

## 2021-03-21 ENCOUNTER — Ambulatory Visit (INDEPENDENT_AMBULATORY_CARE_PROVIDER_SITE_OTHER): Payer: PRIVATE HEALTH INSURANCE | Admitting: Physical Therapy

## 2021-03-21 DIAGNOSIS — M25661 Stiffness of right knee, not elsewhere classified: Secondary | ICD-10-CM

## 2021-03-21 DIAGNOSIS — M25561 Pain in right knee: Secondary | ICD-10-CM

## 2021-03-21 DIAGNOSIS — R600 Localized edema: Secondary | ICD-10-CM

## 2021-03-21 DIAGNOSIS — R2689 Other abnormalities of gait and mobility: Secondary | ICD-10-CM

## 2021-03-21 DIAGNOSIS — M6281 Muscle weakness (generalized): Secondary | ICD-10-CM

## 2021-03-21 DIAGNOSIS — Z9889 Other specified postprocedural states: Secondary | ICD-10-CM

## 2021-03-21 NOTE — Therapy (Signed)
[facility] Physical Therapy [address] [place], [place], [address] Phone: [phone]   Fax:  [phone]  Physical Therapy Treatment  Patient Details  Name: Kristin Murphy MRN: [id number] Date of Birth: 20-Mar-1986 Referring Provider (PT): Julieanne Cotton, [place]   Encounter Date: 03/21/2021   PT End of Session - 03/21/21 1502     Visit Number 7    Number of Visits 16    Date for PT Re-Evaluation 04/20/21    PT Start Time 1424    PT Stop Time 1504    PT Time Calculation (min) 40 min    Activity Tolerance Patient tolerated treatment well    Behavior During Therapy [facility] for tasks assessed/performed             Past Medical History:  Diagnosis Date   Systemic lupus erythematosus (HCC)     Past Surgical History:  Procedure Laterality Date   RENAL BIOPSY     WRIST SURGERY     biopsy    There were no vitals filed for this visit.   Subjective Assessment - 03/21/21 1426     Subjective doing well; last session was tough, but went to the gym and repeated    Limitations Standing;Walking    Patient Stated Goals improve mobility. strength, return to work    Currently in Pain? No/denies                               [facility] Adult PT Treatment/Exercise - 03/21/21 1428       Knee/Hip Exercises: Aerobic   Recumbent Bike L5 x 8 min seat 6      Knee/Hip Exercises: Machines for Strengthening   Cybex Knee Extension 5# bil concentric, RLE eccentric 3x10    Total Gym Leg Press RLE only 100# 3x10      Knee/Hip Exercises: Standing   Step Down Right;15 reps;Step Height: 4"    Step Down Limitations LLE heel tap laterally    Other Standing Knee Exercises RDL RLE only 3x10; 15#KB    Other Standing Knee Exercises with TRX and LLE on slider - forward/lateral/posterior/curtsey lunge x 10 reps each                       PT Short Term Goals - 03/17/21 1001       PT SHORT TERM GOAL #1   Title Independent with initial  HEP    Baseline 03/17/2021 achieved to date PT cont to advance as indicated    Time 4    Period Weeks    Status Achieved    Target Date 03/23/21      PT SHORT TERM GOAL #2   Title Rt knee AROM improved 0-10-120 for improved function    Time 4    Period Weeks    Status On-going    Target Date 03/23/21               PT Long Term Goals - 02/23/21 1302       PT LONG TERM GOAL #1   Title Independent with final HEP    Time 8    Period Weeks    Status New    Target Date 04/20/21      PT LONG TERM GOAL #2   Title Rt knee AROM improved 0-125 for improved function    Time 8    Period Weeks    Status New  Target Date 04/20/21      PT LONG TERM GOAL #3   Title Amb independently without significant deviations for improved function    Time 8    Period Weeks    Status New    Target Date 04/20/21      PT LONG TERM GOAL #4   Title Report pain < 2/10 for improved function    Time 8    Period Weeks    Status New    Target Date 04/20/21      PT LONG TERM GOAL #5   Title FOTO score improved to 71 for improved function    Time 8    Period Weeks    Status New    Target Date 04/20/21                   Plan - 03/21/21 1503     Clinical Impression Statement Pt is demonstrating improvement in quad activation and still has difficulty with eccentric control.  Will continue to benefit from PT to maximize function.    Personal Factors and Comorbidities Comorbidity 1    Comorbidities lupus    Examination-Activity Limitations Bend;Squat;Stairs;Stand;Transfers;Lift;Locomotion Level    Examination-Participation Restrictions Community Activity;Shop;Driving;Occupation    Stability/Clinical Decision Making Evolving/Moderate complexity    Rehab Potential Good    PT Frequency 2x / week    PT Duration 8 weeks    PT Treatment/Interventions ADLs/Self Care Home Management;Cryotherapy;Electrical Stimulation;Moist Heat;Balance training;Therapeutic exercise;Therapeutic  activities;Functional mobility training;Stair training;Gait training;DME Instruction;Neuromuscular re-education;Patient/family education;Manual techniques;Passive range of motion;Vasopneumatic Device;Taping;Dry needling    PT Next Visit Plan BFR as tolerated, Static balance interventions compliant and non compliant surfaces continued c progressive quad strengthening.  Recheck STG/measure - will do next visit    PT Home Exercise Plan Access Code: [email or web address]    Consulted and Agree with Plan of Care Patient             Patient will benefit from skilled therapeutic intervention in order to improve the following deficits and impairments:  Abnormal gait, Decreased endurance, Pain, Decreased strength, Difficulty walking, Decreased mobility, Decreased balance, Increased edema, Decreased range of motion  Visit Diagnosis: Acute pain of right knee  Stiffness of right knee, not elsewhere classified  Other abnormalities of gait and mobility  Localized edema  Muscle weakness (generalized)     Problem List Patient Active Problem List   Diagnosis Date Noted   Lupus nephritis, ISN/RPS class V (HCC) 08/10/2020   Systemic lupus erythematosus (SLE) in adult (HCC) 11/12/2019   Other proteinuria 11/12/2019   Vitamin D deficiency 11/12/2019   History of asthma 11/12/2019   Seasonal allergies 11/12/2019   Former smoker 11/12/2019     Clarita Crane, PT, DPT 03/21/21 3:04 PM    [facility] Physical Therapy [address] [place], [place], [address] Phone: [phone]   Fax:  [phone]  Name: BENNETT VANSCYOC MRN: [id number] Date of Birth: 1985-10-24

## 2021-03-24 ENCOUNTER — Ambulatory Visit (INDEPENDENT_AMBULATORY_CARE_PROVIDER_SITE_OTHER): Payer: PRIVATE HEALTH INSURANCE | Admitting: Physical Therapy

## 2021-03-24 ENCOUNTER — Encounter: Payer: Self-pay | Admitting: Physical Therapy

## 2021-03-24 ENCOUNTER — Other Ambulatory Visit: Payer: Self-pay

## 2021-03-24 DIAGNOSIS — M25661 Stiffness of right knee, not elsewhere classified: Secondary | ICD-10-CM

## 2021-03-24 DIAGNOSIS — R2689 Other abnormalities of gait and mobility: Secondary | ICD-10-CM

## 2021-03-24 DIAGNOSIS — M25561 Pain in right knee: Secondary | ICD-10-CM | POA: Diagnosis not present

## 2021-03-24 DIAGNOSIS — R600 Localized edema: Secondary | ICD-10-CM | POA: Diagnosis not present

## 2021-03-24 DIAGNOSIS — M6281 Muscle weakness (generalized): Secondary | ICD-10-CM

## 2021-03-24 NOTE — Therapy (Signed)
[facility] Therapy [address] [place], [place], [address] Phone: [phone]   Fax:  [phone]  Physical Therapy Treatment  Patient Details  Name: Kristin Murphy MRN: [id number] Date of Birth: 08-May-1986 Referring Provider (PT): Donella Stade, [place]   Encounter Date: 03/24/2021   PT End of Session - 03/24/21 0923     Visit Number 8    Number of Visits 16    Date for PT Re-Evaluation 04/20/21    PT Start Time 0843    PT Stop Time 0928    PT Time Calculation (min) 45 min    Activity Tolerance Patient tolerated treatment well    Behavior During Therapy [facility] for tasks assessed/performed             Past Medical History:  Diagnosis Date   Systemic lupus erythematosus ([place])     Past Surgical History:  Procedure Laterality Date   RENAL BIOPSY     WRIST SURGERY     biopsy    There were no vitals filed for this visit.   Subjective Assessment - 03/24/21 0849     Subjective knee is more sore and swollen today.    Limitations Standing;Walking    Patient Stated Goals improve mobility. strength, return to work    Currently in Pain? Yes    Pain Score 5     Pain Location Knee    Pain Orientation Right    Pain Descriptors / Indicators Discomfort    Pain Type Acute pain    Pain Onset More than a month ago    Pain Frequency Intermittent    Aggravating Factors  walking too much    Pain Relieving Factors rest, ice                [facility] PT Assessment - 03/24/21 0930       Assessment   Medical Diagnosis Z98.890 (ICD-10-CM) - Status post arthroscopy of right knee    Referring Provider (PT) Magnant, Charles L, PA-C      AROM   Right Knee Extension -7   seated LAQ   Right Knee Flexion 133                           [facility] Adult PT Treatment/Exercise - 03/24/21 0850       Knee/Hip Exercises: Aerobic   Recumbent Bike L4 x 8 min seat 6      Knee/Hip Exercises: Seated   Long Arc Quad Right;3 sets;10 reps     Long Arc Quad Weight 3 lbs.    Long Arc Quad Limitations 3 sec hold      Knee/Hip Exercises: Supine   Bridges 3 sets;10 reps    Straight Leg Raises Right;3 sets;10 reps      Knee/Hip Exercises: Sidelying   Hip ABduction Right;3 sets;10 reps      Modalities   Modalities Vasopneumatic      Vasopneumatic   Number Minutes Vasopneumatic  10 minutes    Vasopnuematic Location  Knee    Vasopneumatic Pressure Medium    Vasopneumatic Temperature  34                       PT Short Term Goals - 03/24/21 0930       PT SHORT TERM GOAL #1   Title Independent with initial HEP    Time 4    Period Weeks    Status Achieved  Target Date 03/23/21      PT SHORT TERM GOAL #2   Title Rt knee AROM improved 0-10-120 for improved function    Time 4    Period Weeks    Status Achieved    Target Date 03/23/21               PT Long Term Goals - 03/24/21 0923       PT LONG TERM GOAL #1   Title Independent with final HEP    Time 8    Period Weeks    Status On-going    Target Date 04/20/21      PT LONG TERM GOAL #2   Title Rt knee AROM improved 0-125 for improved function    Time 8    Period Weeks    Status On-going    Target Date 04/20/21      PT LONG TERM GOAL #3   Title Amb independently without significant deviations for improved function    Time 8    Period Weeks    Status On-going    Target Date 04/20/21      PT LONG TERM GOAL #4   Title Report pain < 2/10 for improved function    Time 8    Period Weeks    Status On-going    Target Date 04/20/21      PT LONG TERM GOAL #5   Title FOTO score improved to 71 for improved function    Time 8    Period Weeks    Status On-going    Target Date 04/20/21                   Plan - 03/24/21 0930     Clinical Impression Statement Pt has met both STGs and is progressing well with PT at this time.  Still has some quad weakness impacting functional mobility and will continue to address in clinic.     Personal Factors and Comorbidities Comorbidity 1    Comorbidities lupus    Examination-Activity Limitations Bend;Squat;Stairs;Stand;Transfers;Lift;Locomotion Level    Examination-Participation Restrictions Community Activity;Shop;Driving;Occupation    Stability/Clinical Decision Making Evolving/Moderate complexity    Rehab Potential Good    PT Frequency 2x / week    PT Duration 8 weeks    PT Treatment/Interventions ADLs/Self Care Home Management;Cryotherapy;Electrical Stimulation;Moist Heat;Balance training;Therapeutic exercise;Therapeutic activities;Functional mobility training;Stair training;Gait training;DME Instruction;Neuromuscular re-education;Patient/family education;Manual techniques;Passive range of motion;Vasopneumatic Device;Taping;Dry needling    PT Next Visit Plan BFR as tolerated, Static balance interventions compliant and non compliant surfaces continued c progressive quad strengthening.    PT Home Exercise Plan Access Code: [email or web address]    Consulted and Agree with Plan of Care Patient             Patient will benefit from skilled therapeutic intervention in order to improve the following deficits and impairments:  Abnormal gait, Decreased endurance, Pain, Decreased strength, Difficulty walking, Decreased mobility, Decreased balance, Increased edema, Decreased range of motion  Visit Diagnosis: Acute pain of right knee  Stiffness of right knee, not elsewhere classified  Other abnormalities of gait and mobility  Localized edema  Muscle weakness (generalized)     Problem List Patient Active Problem List   Diagnosis Date Noted   Lupus nephritis, ISN/RPS class V ([place]) 08/10/2020   Systemic lupus erythematosus (SLE) in adult ([place]ristin Murphy) 11/12/2019   Other proteinuria 11/12/2019   Vitamin D deficiency 11/12/2019   History of asthma 11/12/2019   Seasonal allergies 11/12/2019   Former smoker 11/12/2019  Laureen Abrahams, PT, DPT 03/24/21 9:32 AM    [facility] Physical Therapy [address] [place], [place], [address] Phone: [phone]   Fax:  [phone]  Name: Kristin Murphy MRN: [id number] Date of Birth: 1985-07-01

## 2021-03-25 ENCOUNTER — Encounter: Payer: Self-pay | Admitting: Orthopedic Surgery

## 2021-03-25 NOTE — Progress Notes (Signed)
   Post-Op Visit Note   Patient: Kristin Murphy           Date of Birth: 06-26-1985           MRN: [id number] Visit Date: 03/21/2021 PCP: [facility]   Assessment & Plan:  Chief Complaint:  Chief Complaint  Patient presents with   Post-op Follow-up     Right Knee - Routine Post Op     01/18/21 rt knee scope with micro fx     Visit Diagnoses:  1. Status post arthroscopy of right knee     Plan: Patient presents now for follow-up from right knee arthroscopy and microfracture for chondral defect performed 01/18/2021.  Patient is doing the bike at the gym.  Doing leg press and leg extension.  She is in physical therapy here.  She has home coming at [employer] this weekend.  On exam she has no calf tenderness negative Homans.  Trace effusion.  Range of motion is excellent.  Quad strength still slightly deficient compared to the left-hand side.  Plan is 6-week return for final check.  She may need to take anti-inflammatories occasionally to help keep the effusion diminished.  Follow-Up Instructions: Return in about 6 weeks (around 05/02/2021).   Orders:  No orders of the defined types were placed in this encounter.  No orders of the defined types were placed in this encounter.   Imaging: No results found.  PMFS History: Patient Active Problem List   Diagnosis Date Noted   Lupus nephritis, ISN/RPS class V ([facility]) 08/10/2020   Systemic lupus erythematosus (SLE) in adult ([facility]) 11/12/2019   Other proteinuria 11/12/2019   Vitamin D deficiency 11/12/2019   History of asthma 11/12/2019   Seasonal allergies 11/12/2019   Former smoker 11/12/2019   Past Medical History:  Diagnosis Date   Systemic lupus erythematosus (H[facility])     Family History  Problem Relation Age of Onset   Hypertension Mother    Cancer Mother    Arthritis Father    Hypertension Other    Diabetes Other    Arthritis Other    Sarcoidosis Paternal Grandmother    Sarcoidosis Other     Past Surgical  History:  Procedure Laterality Date   RENAL BIOPSY     WRIST SURGERY     biopsy   Social History   Occupational History   Not on file  Tobacco Use   Smoking status: Former    Types: Cigarettes   Smokeless tobacco: Never   Tobacco 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

## 2021-03-28 ENCOUNTER — Ambulatory Visit (INDEPENDENT_AMBULATORY_CARE_PROVIDER_SITE_OTHER): Payer: PRIVATE HEALTH INSURANCE | Admitting: Physical Therapy

## 2021-03-28 ENCOUNTER — Encounter: Payer: Self-pay | Admitting: Physical Therapy

## 2021-03-28 ENCOUNTER — Other Ambulatory Visit: Payer: Self-pay

## 2021-03-28 DIAGNOSIS — M25561 Pain in right knee: Secondary | ICD-10-CM

## 2021-03-28 DIAGNOSIS — R2689 Other abnormalities of gait and mobility: Secondary | ICD-10-CM

## 2021-03-28 DIAGNOSIS — M25661 Stiffness of right knee, not elsewhere classified: Secondary | ICD-10-CM | POA: Diagnosis not present

## 2021-03-28 DIAGNOSIS — M6281 Muscle weakness (generalized): Secondary | ICD-10-CM

## 2021-03-28 DIAGNOSIS — R600 Localized edema: Secondary | ICD-10-CM | POA: Diagnosis not present

## 2021-03-28 NOTE — Therapy (Signed)
[facility] Physical Therapy [address] [place], [place], [address] Phone: [phone]   Fax:  [phone]  Physical Therapy Treatment  Patient Details  Name: Kristin Murphy MRN: [id number] Date of Birth: 12/19/1985 Referring Provider (PT): Julieanne Cotton, [place]   Encounter Date: 03/28/2021   PT End of Session - 03/28/21 0843     Visit Number 9    Number of Visits 16    Date for PT Re-Evaluation 04/20/21    PT Start Time 0759    PT Stop Time 0849    PT Time Calculation (min) 50 min    Activity Tolerance Patient tolerated treatment well    Behavior During Therapy [facility] for tasks assessed/performed             Past Medical History:  Diagnosis Date   Systemic lupus erythematosus (HCC)     Past Surgical History:  Procedure Laterality Date   RENAL BIOPSY     WRIST SURGERY     biopsy    There were no vitals filed for this visit.   Subjective Assessment - 03/28/21 0802     Subjective knee is a little sore from being up and walking all weekend    Limitations Standing;Walking    Patient Stated Goals improve mobility. strength, return to work    Currently in Pain? Yes    Pain Score 2     Pain Location Knee    Pain Orientation Right    Pain Descriptors / Indicators Sore    Pain Type Acute pain;Surgical pain    Pain Onset More than a month ago    Pain Frequency Intermittent    Aggravating Factors  walking too much    Pain Relieving Factors rest, ice                               [facility] Adult PT Treatment/Exercise - 03/28/21 0800       Knee/Hip Exercises: Machines for Strengthening   Total Gym Leg Press RLE only 75# 3x15      Knee/Hip Exercises: Standing   Step Down Right;2 sets;15 reps;Hand Hold: 0;Step Height: 2"    Step Down Limitations LLE heel tap laterally      Knee/Hip Exercises: Seated   Long Arc Quad Right;3 sets;10 reps    Long Arc Quad Weight 10 lbs.    Long Arc Quad Limitations 3 sec hold    Sit  to [employer] 2 sets;10 reps;without UE support   RLE only (LLE toe touch) from elevated surface     Vasopneumatic   Number Minutes Vasopneumatic  10 minutes    Vasopnuematic Location  Knee    Vasopneumatic Pressure Medium    Vasopneumatic Temperature  34                       PT Short Term Goals - 03/24/21 0930       PT SHORT TERM GOAL #1   Title Independent with initial HEP    Time 4    Period Weeks    Status Achieved    Target Date 03/23/21      PT SHORT TERM GOAL #2   Title Rt knee AROM improved 0-10-120 for improved function    Time 4    Period Weeks    Status Achieved    Target Date 03/23/21               PT  Long Term Goals - 03/24/21 0923       PT LONG TERM GOAL #1   Title Independent with final HEP    Time 8    Period Weeks    Status On-going    Target Date 04/20/21      PT LONG TERM GOAL #2   Title Rt knee AROM improved 0-125 for improved function    Time 8    Period Weeks    Status On-going    Target Date 04/20/21      PT LONG TERM GOAL #3   Title Amb independently without significant deviations for improved function    Time 8    Period Weeks    Status On-going    Target Date 04/20/21      PT LONG TERM GOAL #4   Title Report pain < 2/10 for improved function    Time 8    Period Weeks    Status On-going    Target Date 04/20/21      PT LONG TERM GOAL #5   Title FOTO score improved to 71 for improved function    Time 8    Period Weeks    Status On-going    Target Date 04/20/21                   Plan - 03/28/21 0843     Clinical Impression Statement Pt tolerated session well today and able to increase strengthening exercises today.  Will continue to benefit from PT to maximize function.    Personal Factors and Comorbidities Comorbidity 1    Comorbidities lupus    Examination-Activity Limitations Bend;Squat;Stairs;Stand;Transfers;Lift;Locomotion Level    Examination-Participation Restrictions Community  Activity;Shop;Driving;Occupation    Stability/Clinical Decision Making Evolving/Moderate complexity    Rehab Potential Good    PT Frequency 2x / week    PT Duration 8 weeks    PT Treatment/Interventions ADLs/Self Care Home Management;Cryotherapy;Electrical Stimulation;Moist Heat;Balance training;Therapeutic exercise;Therapeutic activities;Functional mobility training;Stair training;Gait training;DME Instruction;Neuromuscular re-education;Patient/family education;Manual techniques;Passive range of motion;Vasopneumatic Device;Taping;Dry needling    PT Next Visit Plan BFR as tolerated, Static balance interventions compliant and non compliant surfaces continued c progressive quad strengthening.    PT Home Exercise Plan Access Code: [email or web address]    Consulted and Agree with Plan of Care Patient             Patient will benefit from skilled therapeutic intervention in order to improve the following deficits and impairments:  Abnormal gait, Decreased endurance, Pain, Decreased strength, Difficulty walking, Decreased mobility, Decreased balance, Increased edema, Decreased range of motion  Visit Diagnosis: Acute pain of right knee  Stiffness of right knee, not elsewhere classified  Other abnormalities of gait and mobility  Localized edema  Muscle weakness (generalized)     Problem List Patient Active Problem List   Diagnosis Date Noted   Lupus nephritis, ISN/RPS class V ([place]) 08/10/2020   Systemic lupus erythematosus (SLE) in adult ([place]) 11/12/2019   Other proteinuria 11/12/2019   Vitamin D deficiency 11/12/2019   History of asthma 11/12/2019   Seasonal allergies 11/12/2019   Former smoker 11/12/2019       Laureen Abrahams, PT, DPT 03/28/21 8:45 AM      [facility] [address] [place], [place], [address] Phone: [phone]   Fax:  [phone]  Name: KELANI THUNBERG MRN: [id number] Date of Birth: Oct 05, 1985

## 2021-04-01 ENCOUNTER — Encounter: Payer: PRIVATE HEALTH INSURANCE | Admitting: Physical Therapy

## 2021-04-04 ENCOUNTER — Ambulatory Visit (INDEPENDENT_AMBULATORY_CARE_PROVIDER_SITE_OTHER): Payer: PRIVATE HEALTH INSURANCE | Admitting: Physical Therapy

## 2021-04-04 ENCOUNTER — Other Ambulatory Visit: Payer: Self-pay

## 2021-04-04 ENCOUNTER — Encounter: Payer: Self-pay | Admitting: Physical Therapy

## 2021-04-04 DIAGNOSIS — M25661 Stiffness of right knee, not elsewhere classified: Secondary | ICD-10-CM | POA: Diagnosis not present

## 2021-04-04 DIAGNOSIS — R2689 Other abnormalities of gait and mobility: Secondary | ICD-10-CM | POA: Diagnosis not present

## 2021-04-04 DIAGNOSIS — R600 Localized edema: Secondary | ICD-10-CM | POA: Diagnosis not present

## 2021-04-04 DIAGNOSIS — M25561 Pain in right knee: Secondary | ICD-10-CM | POA: Diagnosis not present

## 2021-04-04 DIAGNOSIS — M6281 Muscle weakness (generalized): Secondary | ICD-10-CM

## 2021-04-04 NOTE — Therapy (Signed)
[facility] Physical Therapy [address] [place], [place], [address] Phone: [phone]   Fax:  [phone]  Physical Therapy Treatment  Patient Details  Name: Kristin Murphy MRN: [id number] Date of Birth: October 30, 1985 Referring Provider (PT): Julieanne Cotton, [place]   Encounter Date: 04/04/2021   PT End of Session - 04/04/21 0849     Visit Number 10    Number of Visits 16    Date for PT Re-Evaluation 04/20/21    PT Start Time 0846    PT Stop Time 0940    PT Time Calculation (min) 54 min    Activity Tolerance Patient tolerated treatment well;Patient limited by pain    Behavior During Therapy [facility] for tasks assessed/performed             Past Medical History:  Diagnosis Date   Systemic lupus erythematosus ([facility])     Past Surgical History:  Procedure Laterality Date   RENAL BIOPSY     WRIST SURGERY     biopsy    There were no vitals filed for this visit.   Subjective Assessment - 04/04/21 0849     Subjective last week was rough. Still stiff and sore in the morning. Still feels like it is giving out when walking    Limitations Standing;Walking    Patient Stated Goals improve mobility. strength, return to work    Currently in Pain? No/denies                               [facility] Adult PT Treatment/Exercise - 04/04/21 0001       Knee/Hip Exercises: Aerobic   Recumbent Bike L4 x 8 min seat 6      Knee/Hip Exercises: Machines for Strengthening   Cybex Knee Extension 10# bil up; rt down x 5 painful    Total Gym Leg Press RLE only 75# 3x15      Knee/Hip Exercises: Standing   Forward Lunges Right;2 sets;10 reps    Forward Lunges Limitations right foot on 4 inch steps    Step Down Right;1 set;10 reps;Hand Hold: 0;Step Height: 2"    Step Down Limitations Rt fwd/bwd step over 2 nich left foot staying on step x 10 2inch x10 4 inch; fwd heel taps 2x5      Knee/Hip Exercises: Seated   Long Arc Quad Right;10 reps;2 sets     Long Arc Quad Weight --   7.5#; 10# was painful today   Long Arc Quad Limitations 3 sec hold with strap assist    Sit to [employer] 10 reps;without UE support   RLE only (LLE toe touch) from elevated surface     Vasopneumatic   Number Minutes Vasopneumatic  10 minutes    Vasopnuematic Location  Knee    Vasopneumatic Pressure Medium    Vasopneumatic Temperature  34                       PT Short Term Goals - 03/24/21 0930       PT SHORT TERM GOAL #1   Title Independent with initial HEP    Time 4    Period Weeks    Status Achieved    Target Date 03/23/21      PT SHORT TERM GOAL #2   Title Rt knee AROM improved 0-10-120 for improved function    Time 4    Period Weeks    Status Achieved  Target Date 03/23/21               PT Long Term Goals - 03/24/21 0923       PT LONG TERM GOAL #1   Title Independent with final HEP    Time 8    Period Weeks    Status On-going    Target Date 04/20/21      PT LONG TERM GOAL #2   Title Rt knee AROM improved 0-125 for improved function    Time 8    Period Weeks    Status On-going    Target Date 04/20/21      PT LONG TERM GOAL #3   Title Amb independently without significant deviations for improved function    Time 8    Period Weeks    Status On-going    Target Date 04/20/21      PT LONG TERM GOAL #4   Title Report pain < 2/10 for improved function    Time 8    Period Weeks    Status On-going    Target Date 04/20/21      PT LONG TERM GOAL #5   Title FOTO score improved to 71 for improved function    Time 8    Period Weeks    Status On-going    Target Date 04/20/21                   Plan - 04/04/21 0931     Clinical Impression Statement Patient limited by fear with fwd heel taps/step downs. She was able to easier in lateral position and with cues to unlock knee before heel tap. Able to tolerates standing lunges without complaint. She also had increased pain with LAQ and knee extension using 10#.  Decreased to 7.5 # today but still painful at 7/10. Still reporting pull in mid to medial knee with AROM. Normal respons to vasopneumatic.    Examination-Activity Limitations Bend;Squat;Stairs;Stand;Transfers;Lift;Locomotion Level    Examination-Participation Restrictions Community Activity;Shop;Driving;Occupation    PT Frequency 2x / week    PT Duration 8 weeks    PT Treatment/Interventions ADLs/Self Care Home Management;Cryotherapy;Electrical Stimulation;Moist Heat;Balance training;Therapeutic exercise;Therapeutic activities;Functional mobility training;Stair training;Gait training;DME Instruction;Neuromuscular re-education;Patient/family education;Manual techniques;Passive range of motion;Vasopneumatic Device;Taping;Dry needling    PT Next Visit Plan BFR as tolerated, Static balance interventions compliant and non compliant surfaces continued c progressive quad strengthening.    PT Home Exercise Plan Access Code: [facility]             Patient will benefit from skilled therapeutic intervention in order to improve the following deficits and impairments:  Abnormal gait, Decreased endurance, Pain, Decreased strength, Difficulty walking, Decreased mobility, Decreased balance, Increased edema, Decreased range of motion  Visit Diagnosis: Acute pain of right knee  Stiffness of right knee, not elsewhere classified  Other abnormalities of gait and mobility  Localized edema  Muscle weakness (generalized)     Problem List Patient Active Problem List   Diagnosis Date Noted   Lupus nephritis, ISN/RPS class V ([place]) 08/10/2020   Systemic lupus erythematosus (SLE) in adult ([place]) 11/12/2019   Other proteinuria 11/12/2019   Vitamin D deficiency 11/12/2019   History of asthma 11/12/2019   Seasonal allergies 11/12/2019   Former smoker 11/12/2019    Madelyn Flavors, PT 04/04/2021, 9:49 AM  [facility] Physical Therapy [address] [place], [place], [address] Phone:  [phone]   Fax:  [phone]  Name: Kristin Murphy MRN: [id number] Date of Birth: July 13, 1985

## 2021-04-08 ENCOUNTER — Encounter: Payer: Self-pay | Admitting: Physical Therapy

## 2021-04-08 ENCOUNTER — Ambulatory Visit (INDEPENDENT_AMBULATORY_CARE_PROVIDER_SITE_OTHER): Payer: PRIVATE HEALTH INSURANCE | Admitting: Physical Therapy

## 2021-04-08 ENCOUNTER — Other Ambulatory Visit: Payer: Self-pay

## 2021-04-08 DIAGNOSIS — M25661 Stiffness of right knee, not elsewhere classified: Secondary | ICD-10-CM | POA: Diagnosis not present

## 2021-04-08 DIAGNOSIS — M25561 Pain in right knee: Secondary | ICD-10-CM

## 2021-04-08 DIAGNOSIS — R2689 Other abnormalities of gait and mobility: Secondary | ICD-10-CM | POA: Diagnosis not present

## 2021-04-08 DIAGNOSIS — R600 Localized edema: Secondary | ICD-10-CM | POA: Diagnosis not present

## 2021-04-08 DIAGNOSIS — M6281 Muscle weakness (generalized): Secondary | ICD-10-CM

## 2021-04-08 NOTE — Therapy (Signed)
[facility] [address] [place], [place], [address] Phone: [phone]   Fax:  [phone]  Physical Therapy Treatment  Patient Details  Name: Kristin Murphy MRN: [id number] Date of Birth: December 30, 1985 Referring Provider (PT): Julieanne Cotton, [place]   Encounter Date: 04/08/2021   PT End of Session - 04/08/21 1132     Visit Number 11    Number of Visits 16    Date for PT Re-Evaluation 04/20/21    PT Start Time 1053    PT Stop Time 1133    PT Time Calculation (min) 40 min    Activity Tolerance Patient tolerated treatment well;Patient limited by pain    Behavior During Therapy [facility] for tasks assessed/performed             Past Medical History:  Diagnosis Date   Systemic lupus erythematosus (HCC)     Past Surgical History:  Procedure Laterality Date   RENAL BIOPSY     WRIST SURGERY     biopsy    There were no vitals filed for this visit.   Subjective Assessment - 04/08/21 1054     Subjective still stiff today, and having a little pain ealier today    Limitations Standing;Walking    Patient Stated Goals improve mobility. strength, return to work    Currently in Pain? No/denies                               [facility] Adult PT Treatment/Exercise - 04/08/21 1056       Knee/Hip Exercises: Aerobic   Recumbent Bike L6 x 8 min      Knee/Hip Exercises: Machines for Strengthening   Cybex Knee Extension RLE only 90-45 5# 2x15    Total Gym Leg Press 75# 3x15; single limb performed bil      Knee/Hip Exercises: Standing   Step Down Right;1 set;10 reps;Hand Hold: 0;Step Height: 4"    Step Down Limitations LLE heel tap laterally    Other Standing Knee Exercises forward step down with RLE on 4" step x 10 reps bil      Knee/Hip Exercises: Seated   Other Seated Knee/Hip Exercises seated Rt SLR 2x10 with 2#    Sit to Sand 10 reps;without UE support   RLE only                      PT Short Term  Goals - 03/24/21 0930       PT SHORT TERM GOAL #1   Title Independent with initial HEP    Time 4    Period Weeks    Status Achieved    Target Date 03/23/21      PT SHORT TERM GOAL #2   Title Rt knee AROM improved 0-10-120 for improved function    Time 4    Period Weeks    Status Achieved    Target Date 03/23/21               PT Long Term Goals - 03/24/21 0923       PT LONG TERM GOAL #1   Title Independent with final HEP    Time 8    Period Weeks    Status On-going    Target Date 04/20/21      PT LONG TERM GOAL #2   Title Rt knee AROM improved 0-125 for improved function    Time 8    Period  Weeks    Status On-going    Target Date 04/20/21      PT LONG TERM GOAL #3   Title Amb independently without significant deviations for improved function    Time 8    Period Weeks    Status On-going    Target Date 04/20/21      PT LONG TERM GOAL #4   Title Report pain < 2/10 for improved function    Time 8    Period Weeks    Status On-going    Target Date 04/20/21      PT LONG TERM GOAL #5   Title FOTO score improved to 71 for improved function    Time 8    Period Weeks    Status On-going    Target Date 04/20/21                   Plan - 04/08/21 1132     Clinical Impression Statement Pt tolerated session well today and continues to have difficulty with SL eccentric quad exercises.  Exercises improve with repetition and cues.  Will continue to benefit from PT to maximize function.  Declined vaso today.    Examination-Activity Limitations Bend;Squat;Stairs;Stand;Transfers;Lift;Locomotion Level    Examination-Participation Restrictions Community Activity;Shop;Driving;Occupation    PT Frequency 2x / week    PT Duration 8 weeks    PT Treatment/Interventions ADLs/Self Care Home Management;Cryotherapy;Electrical Stimulation;Moist Heat;Balance training;Therapeutic exercise;Therapeutic activities;Functional mobility training;Stair training;Gait training;DME  Instruction;Neuromuscular re-education;Patient/family education;Manual techniques;Passive range of motion;Vasopneumatic Device;Taping;Dry needling    PT Next Visit Plan Static balance interventions compliant and non compliant surfaces continued c progressive quad strengthening; eccentric quad    PT Home Exercise Plan Access Code: [email or web address]    Consulted and Agree with Plan of Care Patient             Patient will benefit from skilled therapeutic intervention in order to improve the following deficits and impairments:  Abnormal gait, Decreased endurance, Pain, Decreased strength, Difficulty walking, Decreased mobility, Decreased balance, Increased edema, Decreased range of motion  Visit Diagnosis: Acute pain of right knee  Stiffness of right knee, not elsewhere classified  Other abnormalities of gait and mobility  Localized edema  Muscle weakness (generalized)     Problem List Patient Active Problem List   Diagnosis Date Noted   Lupus nephritis, ISN/RPS class V ([place]) 08/10/2020   Systemic lupus erythematosus (SLE) in adult ([place]) 11/12/2019   Other proteinuria 11/12/2019   Vitamin D deficiency 11/12/2019   History of asthma 11/12/2019   Seasonal allergies 11/12/2019   Former smoker 11/12/2019      Laureen Abrahams, PT, DPT 04/08/21 11:34 AM      [facility] [address] [place], [place], [address] Phone: [phone]   Fax:  [phone]  Name: LINNAEA BOLLING MRN: [id number] Date of Birth: 1986/04/24

## 2021-04-11 ENCOUNTER — Other Ambulatory Visit: Payer: Self-pay

## 2021-04-11 ENCOUNTER — Ambulatory Visit (INDEPENDENT_AMBULATORY_CARE_PROVIDER_SITE_OTHER): Payer: PRIVATE HEALTH INSURANCE | Admitting: Physical Therapy

## 2021-04-11 ENCOUNTER — Telehealth: Payer: Self-pay | Admitting: Rheumatology

## 2021-04-11 ENCOUNTER — Encounter: Payer: Self-pay | Admitting: Physical Therapy

## 2021-04-11 DIAGNOSIS — M25561 Pain in right knee: Secondary | ICD-10-CM

## 2021-04-11 DIAGNOSIS — R2689 Other abnormalities of gait and mobility: Secondary | ICD-10-CM | POA: Diagnosis not present

## 2021-04-11 DIAGNOSIS — R600 Localized edema: Secondary | ICD-10-CM | POA: Diagnosis not present

## 2021-04-11 DIAGNOSIS — M25661 Stiffness of right knee, not elsewhere classified: Secondary | ICD-10-CM

## 2021-04-11 DIAGNOSIS — M6281 Muscle weakness (generalized): Secondary | ICD-10-CM

## 2021-04-11 NOTE — Therapy (Signed)
[facility] [address] [place], [place], [address] Phone: [phone]   Fax:  [phone]  Physical Therapy Treatment  Patient Details  Name: Kristin Murphy MRN: [id number] Date of Birth: 03-23-1986 Referring Provider (PT): Julieanne Cotton, [place]   Encounter Date: 04/11/2021   PT End of Session - 04/11/21 1231     Visit Number 12    Number of Visits 16    Date for PT Re-Evaluation 04/20/21    PT Start Time 1141    PT Stop Time 1225    PT Time Calculation (min) 44 min    Activity Tolerance Patient tolerated treatment well;Patient limited by pain    Behavior During Therapy [facility] for tasks assessed/performed             Past Medical History:  Diagnosis Date   Systemic lupus erythematosus (HCC)     Past Surgical History:  Procedure Laterality Date   RENAL BIOPSY     WRIST SURGERY     biopsy    There were no vitals filed for this visit.   Subjective Assessment - 04/11/21 1142     Subjective stepped odd getting out of car and has had pain since.  hasn't changed much overall.    Limitations Standing;Walking    Patient Stated Goals improve mobility. strength, return to work    Currently in Pain? No/denies                               [facility] Adult PT Treatment/Exercise - 04/11/21 1145       Knee/Hip Exercises: Aerobic   Recumbent Bike L6 x 8 min      Knee/Hip Exercises: Standing   Heel Raises 3 sets;10 reps    Functional Squat 3 sets;10 reps    Functional Squat Limitations with mirror for midline awareness      Knee/Hip Exercises: Seated   Long Arc Quad Right;3 sets;10 reps;Weights    Long Arc Quad Weight --   7.5#   Long Arc Quad Limitations 3 sec hold    Other Seated Knee/Hip Exercises seated Rt SLR 3x10 with 2#      Knee/Hip Exercises: Supine   Short Arc Quad Sets Right;3 sets;10 reps    Short Arc Quad Sets Limitations 7.5#                       PT Short Term Goals -  03/24/21 0930       PT SHORT TERM GOAL #1   Title Independent with initial HEP    Time 4    Period Weeks    Status Achieved    Target Date 03/23/21      PT SHORT TERM GOAL #2   Title Rt knee AROM improved 0-10-120 for improved function    Time 4    Period Weeks    Status Achieved    Target Date 03/23/21               PT Long Term Goals - 03/24/21 0923       PT LONG TERM GOAL #1   Title Independent with final HEP    Time 8    Period Weeks    Status On-going    Target Date 04/20/21      PT LONG TERM GOAL #2   Title Rt knee AROM improved 0-125 for improved function    Time 8  Period Weeks    Status On-going    Target Date 04/20/21      PT LONG TERM GOAL #3   Title Amb independently without significant deviations for improved function    Time 8    Period Weeks    Status On-going    Target Date 04/20/21      PT LONG TERM GOAL #4   Title Report pain < 2/10 for improved function    Time 8    Period Weeks    Status On-going    Target Date 04/20/21      PT LONG TERM GOAL #5   Title FOTO score improved to 71 for improved function    Time 8    Period Weeks    Status On-going    Target Date 04/20/21                   Plan - 04/11/21 1231     Clinical Impression Statement Pt with increased pain today with SLS activities so limited these today.  Pain increased due to an awkward step taken the other day and anticipate this will improve over the next few days, and if not will have her follow up with MD.  Will continue to benefit from PT to maximize function.    Examination-Activity Limitations Bend;Squat;Stairs;Stand;Transfers;Lift;Locomotion Level    Examination-Participation Restrictions Community Activity;Shop;Driving;Occupation    PT Frequency 2x / week    PT Duration 8 weeks    PT Treatment/Interventions ADLs/Self Care Home Management;Cryotherapy;Electrical Stimulation;Moist Heat;Balance training;Therapeutic exercise;Therapeutic  activities;Functional mobility training;Stair training;Gait training;DME Instruction;Neuromuscular re-education;Patient/family education;Manual techniques;Passive range of motion;Vasopneumatic Device;Taping;Dry needling    PT Next Visit Plan Static balance interventions compliant and non compliant surfaces continued c progressive quad strengthening; eccentric quad; see how pain is    PT Home Exercise Plan Access Code: [email or web address]    Consulted and Agree with Plan of Care Patient             Patient will benefit from skilled therapeutic intervention in order to improve the following deficits and impairments:  Abnormal gait, Decreased endurance, Pain, Decreased strength, Difficulty walking, Decreased mobility, Decreased balance, Increased edema, Decreased range of motion  Visit Diagnosis: Acute pain of right knee  Stiffness of right knee, not elsewhere classified  Other abnormalities of gait and mobility  Localized edema  Muscle weakness (generalized)     Problem List Patient Active Problem List   Diagnosis Date Noted   Lupus nephritis, ISN/RPS class V ([place]) 08/10/2020   Systemic lupus erythematosus (SLE) in adult ([place]) 11/12/2019   Other proteinuria 11/12/2019   Vitamin D deficiency 11/12/2019   History of asthma 11/12/2019   Seasonal allergies 11/12/2019   Former smoker 11/12/2019      Laureen Abrahams, PT, DPT 04/11/21 12:49 PM     [facility] [address] [place], [place], [address] Phone: [phone]   Fax:  [phone]  Name: COVA ROBLEZ MRN: [id number] Date of Birth: 1985-08-11

## 2021-04-11 NOTE — Telephone Encounter (Signed)
Ok to provide documentation for handicap renewal.

## 2021-04-11 NOTE — Telephone Encounter (Signed)
Patient requesting handicap placard to be renewed. Current one expires at the end of this month. Please call to advise when ready to pick up.

## 2021-04-12 NOTE — Progress Notes (Signed)
Office Visit Note  Patient: Kristin Murphy             Date of Birth: Dec 23, 1985           MRN: [id number]             PCP: Center, [facility] Referring: Center, [place] Visit Date: 04/26/2021 Occupation: @GUAROCC @  Subjective:  Medication management.   History of Present Illness: Kristin Murphy is a 35 y.o. female with systemic lupus, and lupus nephritis.  She underwent right meniscal tear repair in August 2022 by Dr. Marlou Sa.  She is recovering well from that.  She still have some stiffness in her right knee joint.  She is going to physical therapy.  She is occasional discomfort in her elbows.  She has not seen any joint swelling.  She complains of dry eyes.  There is no history of oral ulcers, nasal ulcers, malar rash, photosensitivity, Raynaud's phenomenon or lymphadenopathy.  She is concerned about the hyperpigmentation of the skin which she had prior to starting hydroxychloroquine.  She will schedule an appointment with the dermatologist.  She has a appointment coming up soon.  She also may need a gastroenterologist for the screening of colon cancer.  Patient states that the plan for wart was HPV.  She is getting immunization against HPV.  She will also follow-up with the GYN closely.  Activities of Daily Living:  Patient reports morning stiffness for a few  minutes.   Patient Denies nocturnal pain.  Difficulty dressing/grooming: Denies Difficulty climbing stairs: Reports Difficulty getting out of chair: Reports Difficulty using hands for taps, buttons, cutlery, and/or writing: Reports  Review of Systems  Constitutional:  Negative for fatigue.  HENT:  Negative for mouth sores, mouth dryness and nose dryness.   Eyes:  Positive for dryness. Negative for pain and itching.  Respiratory:  Negative for shortness of breath and difficulty breathing.   Cardiovascular:  Negative for chest pain and palpitations.  Gastrointestinal:  Negative for blood in stool, constipation  and diarrhea.  Endocrine: Negative for increased urination.  Genitourinary:  Negative for difficulty urinating.  Musculoskeletal:  Positive for joint pain, joint pain and morning stiffness. Negative for joint swelling, myalgias, muscle tenderness and myalgias.  Skin:  Negative for color change, rash, redness and sensitivity to sunlight.  Allergic/Immunologic: Negative for susceptible to infections.  Neurological:  Positive for headaches. Negative for dizziness, numbness, memory loss and weakness.  Hematological:  Negative for bruising/bleeding tendency and swollen glands.  Psychiatric/Behavioral:  Negative for depressed mood, confusion and sleep disturbance. The patient is not nervous/anxious.    PMFS History:  Patient Active Problem List   Diagnosis Date Noted   Lupus nephritis, ISN/RPS class V ([place]) 08/10/2020   Systemic lupus erythematosus (SLE) in adult ([place]) 11/12/2019   Other proteinuria 11/12/2019   Vitamin D deficiency 11/12/2019   History of asthma 11/12/2019   Seasonal allergies 11/12/2019   Former smoker 11/12/2019    Past Medical History:  Diagnosis Date   Systemic lupus erythematosus ([place])     Family History  Problem Relation Age of Onset   Hypertension Mother    Cancer Mother    Arthritis Father    Hypertension Other    Diabetes Other    Arthritis Other    Sarcoidosis Paternal Grandmother    Sarcoidosis Other    Past Surgical History:  Procedure Laterality Date   KNEE ARTHROSCOPY Right 2022   RENAL BIOPSY     WRIST SURGERY  biopsy   Social History   Social History Narrative   Not on file   Immunization History  Administered Date(s) Administered   PFIZER(Purple Top)SARS-COV-2 Vaccination 12/08/2019, 12/29/2019     Objective: Vital Signs: BP 128/81 (BP Location: Left Arm, Patient Position: Sitting, Cuff Size: Normal)   Pulse 73   Ht 5\' 6"  (1.676 m)   Wt 238 lb (108 kg)   BMI 38.41 kg/m    Physical Exam Vitals and nursing note reviewed.   Constitutional:      Appearance: She is well-developed.  HENT:     Head: Normocephalic and atraumatic.  Eyes:     Conjunctiva/sclera: Conjunctivae normal.  Cardiovascular:     Rate and Rhythm: Normal rate and regular rhythm.     Heart sounds: Normal heart sounds.  Pulmonary:     Effort: Pulmonary effort is normal.     Breath sounds: Normal breath sounds.  Abdominal:     General: Bowel sounds are normal.     Palpations: Abdomen is soft.  Musculoskeletal:     Cervical back: Normal range of motion.  Lymphadenopathy:     Cervical: No cervical adenopathy.  Skin:    General: Skin is warm and dry.     Capillary Refill: Capillary refill takes less than 2 seconds.  Neurological:     Mental Status: She is alert and oriented to person, place, and time.  Psychiatric:        Behavior: Behavior normal.     Musculoskeletal Exam: C-spine was in good range of motion.  Shoulder joints, elbow joints, wrist joints, MCPs PIPs and DIPs with good range of motion with no synovitis.  Hip joints,ankles, MTPs and PIPs with good range of motion with no synovitis.  She had warmth and swelling on palpation of her right knee.  CDAI Exam: CDAI Score: -- Patient Global: --; Provider Global: -- Swollen: --; Tender: -- Joint Exam 04/26/2021   No joint exam has been documented for this visit   There is currently no information documented on the homunculus. Go to the Rheumatology activity and complete the homunculus joint exam.  Investigation: No additional findings.  Imaging: No results found.  Recent Labs: Lab Results  Component Value Date   WBC 3.0 (L) 03/03/2021   HGB 12.6 03/03/2021   PLT 222 03/03/2021   NA 140 03/03/2021   K 4.0 03/03/2021   CL 106 03/03/2021   CO2 27 03/03/2021   GLUCOSE 70 03/03/2021   BUN 12 03/03/2021   CREATININE 0.73 03/03/2021   BILITOT 0.3 03/03/2021   AST 14 03/03/2021   ALT 6 03/03/2021   PROT 7.7 03/03/2021   CALCIUM 9.2 03/03/2021   GFRAA 118  11/12/2020   QFTBGOLDPLUS NEGATIVE 09/02/2019   March 03, 2021 UA showed 1+ protein, protein creatinine ratio 260, C3-C4 normal, dsDNA negative, sed rate 28, vitamin D 38  Speciality Comments: PLQ eye exam: 07/20/2020 normal. Alois Cliche, OD. Follow up in 1 year.  Procedures:  No procedures performed Allergies: Amoxicillin, Paba derivatives, Penicillins, and Sulfa antibiotics   Assessment / Plan:     Visit Diagnoses: Systemic lupus erythematosus (SLE) in adult ([place]) - Positive ANA, positive Ro, positive Sm, positive RNP, proteinuria, inflammatory arthritis, lupus nephritis.  -Patient is clinically doing well.  She has dry eyes.  There is no history of oral ulcers, nasal ulcers, malar rash, photosensitivity, Raynaud's phenomenon or lymphadenopathy.  She has mild sicca symptoms.  She denies any inflammatory arthritis.  Her labs showed neutropenia and proteinuria.  Plan: Protein / creatinine ratio, urine, Anti-DNA antibody, double-stranded, C3 and C4, Sedimentation rate, Urinalysis, Routine w reflex microscopic  Lupus nephritis, ISN/RPS class V (HCC) - Renal biopsy Oct 21, 2019 consistent with membranous lupus nephritis (class V), proteinuria.  Followed by [facility] Marisue Humble at [facility].  No change in treatment was advised.  High risk medication use - Plaquenil 200 mg twice daily, Imuran 150 mg p.o. daily. PLQ eye exam: 07/20/2020.  -She had good eye examination in February 2023.  Plan: CBC with Differential/Platelet, COMPLETE METABOLIC PANEL WITH GFR in January and then every 3 months to monitor for drug toxicity.  Other neutropenia (HCC)-she has chronic neutropenia which is a stable.  Other proteinuria - followed by nephrology.  Chronic pain of right knee - This post meniscal tear repair by Dr. August Saucer in August 2022.  She is going to physical therapy.  She had warmth and swelling on palpation of her right knee joint.  Plantar wart-according to the patient the biopsy was positive for  HPV.  She is getting immunization for HPV.  She is followed by GYN.  Increased risk of cervical cancer with HPV was discussed.  Yearly Pap smear was advised.  History of asthma  Vitamin D deficiency-vitamin D is a stable.  Seasonal allergies  Former smoker  Orders: Orders Placed This Encounter  Procedures   Protein / creatinine ratio, urine   CBC with Differential/Platelet   COMPLETE METABOLIC PANEL WITH GFR   Anti-DNA antibody, double-stranded   C3 and C4   Sedimentation rate   Urinalysis, Routine w reflex microscopic    No orders of the defined types were placed in this encounter.    Follow-Up Instructions: Return in about 3 months (around 07/26/2021) for Systemic lupus.   Pollyann Savoy, MD  Note - This record has been created using Animal nutritionist.  Chart creation errors have been sought, but may not always  have been located. Such creation errors do not reflect on  the standard of medical care.

## 2021-04-12 NOTE — Telephone Encounter (Signed)
Patient advised her handicap placard form is ready for pick up ?

## 2021-04-13 ENCOUNTER — Encounter: Payer: PRIVATE HEALTH INSURANCE | Admitting: Physical Therapy

## 2021-04-18 ENCOUNTER — Ambulatory Visit (INDEPENDENT_AMBULATORY_CARE_PROVIDER_SITE_OTHER): Payer: PRIVATE HEALTH INSURANCE | Admitting: Physical Therapy

## 2021-04-18 ENCOUNTER — Other Ambulatory Visit: Payer: Self-pay

## 2021-04-18 ENCOUNTER — Encounter: Payer: Self-pay | Admitting: Physical Therapy

## 2021-04-18 DIAGNOSIS — M25561 Pain in right knee: Secondary | ICD-10-CM | POA: Diagnosis not present

## 2021-04-18 DIAGNOSIS — R600 Localized edema: Secondary | ICD-10-CM | POA: Diagnosis not present

## 2021-04-18 DIAGNOSIS — R2689 Other abnormalities of gait and mobility: Secondary | ICD-10-CM | POA: Diagnosis not present

## 2021-04-18 DIAGNOSIS — M6281 Muscle weakness (generalized): Secondary | ICD-10-CM

## 2021-04-18 DIAGNOSIS — M25661 Stiffness of right knee, not elsewhere classified: Secondary | ICD-10-CM

## 2021-04-18 NOTE — Therapy (Signed)
[facility] Physical Therapy [address] [place], [place], [address] Phone: [phone]   Fax:  [phone]  Physical Therapy Treatment/Recertification  Patient Details  Name: Kristin Murphy MRN: [id number] Date of Birth: Oct 01, 1985 Referring Provider (PT): Donella Stade, [place]   Encounter Date: 04/18/2021   PT End of Session - 04/18/21 1004     Visit Number 13    Number of Visits 25    Date for PT Re-Evaluation 05/30/21    PT Start Time 0925    PT Stop Time 1005    PT Time Calculation (min) 40 min    Activity Tolerance Patient tolerated treatment well;Patient limited by pain    Behavior During Therapy [facility] for tasks assessed/performed             Past Medical History:  Diagnosis Date   Systemic lupus erythematosus ([place])     Past Surgical History:  Procedure Laterality Date   RENAL BIOPSY     WRIST SURGERY     biopsy    There were no vitals filed for this visit.   Subjective Assessment - 04/18/21 0925     Subjective pain has improved overall    Limitations Standing;Walking    Patient Stated Goals improve mobility. strength, return to work    Currently in Pain? Yes    Pain Score 3     Pain Location Knee    Pain Orientation Right    Pain Descriptors / Indicators Sore    Pain Type Acute pain;Surgical pain    Pain Onset More than a month ago    Pain Frequency Intermittent    Aggravating Factors  walking    Pain Relieving Factors rest, ice                [facility] PT Assessment - 04/18/21 0937       Assessment   Medical Diagnosis Z98.890 (ICD-10-CM) - Status post arthroscopy of right knee    Referring Provider (PT) Magnant, Gerrianne Scale, PA-C      Observation/Other Assessments   Focus on Therapeutic Outcomes (FOTO)  55      AROM   Right Knee Extension 0    Right Knee Flexion 134                           [facility] Adult PT Treatment/Exercise - 04/18/21 0927       Knee/Hip Exercises: Aerobic   Recumbent  Bike L6 x 8 min      Knee/Hip Exercises: Machines for Strengthening   Total Gym Leg Press 75# 3x15; single limb performed bil      Knee/Hip Exercises: Standing   Functional Squat 3 sets;10 reps    Functional Squat Limitations TRX with heel raise on Lt to increase RLE weight bearing    Other Standing Knee Exercises RDL 12# 3x10 (bil LEs - deferred SLS today)      Knee/Hip Exercises: Seated   Long Arc Quad Right;3 sets;10 reps;Weights    Long Arc Quad Weight 10 lbs.    Long Arc Quad Limitations 3 sec hold                       PT Short Term Goals - 03/24/21 0930       PT SHORT TERM GOAL #1   Title Independent with initial HEP    Time 4    Period Weeks    Status Achieved    Target Date  03/23/21      PT SHORT TERM GOAL #2   Title Rt knee AROM improved 0-10-120 for improved function    Time 4    Period Weeks    Status Achieved    Target Date 03/23/21               PT Long Term Goals - 04/18/21 1004       PT LONG TERM GOAL #1   Title Independent with final HEP    Time 6    Period Weeks    Status On-going    Target Date 05/30/21      PT LONG TERM GOAL #2   Title Rt knee AROM improved 0-125 for improved function    Time 8    Period Weeks    Status Achieved      PT LONG TERM GOAL #3   Title Amb independently without significant deviations for improved function    Time 6    Period Weeks    Status On-going    Target Date 05/30/21      PT LONG TERM GOAL #4   Title Report pain < 2/10 for improved function    Time 6    Period Weeks    Status On-going    Target Date 05/30/21      PT LONG TERM GOAL #5   Title FOTO score improved to 71 for improved function    Time 6    Period Weeks    Status On-going    Target Date 05/30/21                   Plan - 04/18/21 1005     Clinical Impression Statement Pt has met 1 LTG at this time and has demonstrated steady progress towards other goals.  Pain and gait are improving just not to goal  yet.  HEP is met to date, but will likely update before d/c.  Recommend PT 2x/wk x 4-6 more weeks to maximize function and reach LTGs.    Examination-Activity Limitations Bend;Squat;Stairs;Stand;Transfers;Lift;Locomotion Level    Examination-Participation Restrictions Community Activity;Shop;Driving;Occupation    PT Frequency 2x / week    PT Duration 6 weeks    PT Treatment/Interventions ADLs/Self Care Home Management;Cryotherapy;Electrical Stimulation;Moist Heat;Balance training;Therapeutic exercise;Therapeutic activities;Functional mobility training;Stair training;Gait training;DME Instruction;Neuromuscular re-education;Patient/family education;Manual techniques;Passive range of motion;Vasopneumatic Device;Taping;Dry needling    PT Next Visit Plan balance and quad strengthening    PT Home Exercise Plan Access Code: [email or web address]    Consulted and Agree with Plan of Care Patient             Patient will benefit from skilled therapeutic intervention in order to improve the following deficits and impairments:  Abnormal gait, Decreased endurance, Pain, Decreased strength, Difficulty walking, Decreased mobility, Decreased balance, Increased edema, Decreased range of motion  Visit Diagnosis: Acute pain of right knee  Stiffness of right knee, not elsewhere classified  Other abnormalities of gait and mobility  Localized edema  Muscle weakness (generalized)     Problem List Patient Active Problem List   Diagnosis Date Noted   Lupus nephritis, ISN/RPS class V ([place]) 08/10/2020   Systemic lupus erythematosus (SLE) in adult ([place]) 11/12/2019   Other proteinuria 11/12/2019   Vitamin D deficiency 11/12/2019   History of asthma 11/12/2019   Seasonal allergies 11/12/2019   Former smoker 11/12/2019    Faustino Congress, PT 04/18/2021, 10:07 AM  [place] [address] [place], [place], [address] Phone: [phone]   Fax:  [phone]  Name: Kristin Murphy MRN: [id number] Date of Birth: November 03, 1985

## 2021-04-19 ENCOUNTER — Other Ambulatory Visit: Payer: Self-pay | Admitting: Rheumatology

## 2021-04-19 DIAGNOSIS — M329 Systemic lupus erythematosus, unspecified: Secondary | ICD-10-CM

## 2021-04-19 NOTE — Telephone Encounter (Signed)
Next Visit: 04/26/2021  Last Visit: 01/11/2021  Labs: 03/03/2021 CMP is normal.  White cell count is low and stable  Eye exam:  07/20/2020 normal.   Current Dose per office note 01/11/2021: Plaquenil 200 mg twice daily  PT:[id number] lupus erythematosus   Last Fill: 11/25/2020  Okay to refill Plaquenil?

## 2021-04-20 ENCOUNTER — Other Ambulatory Visit: Payer: Self-pay

## 2021-04-20 ENCOUNTER — Ambulatory Visit (INDEPENDENT_AMBULATORY_CARE_PROVIDER_SITE_OTHER): Payer: PRIVATE HEALTH INSURANCE | Admitting: Physical Therapy

## 2021-04-20 DIAGNOSIS — M25661 Stiffness of right knee, not elsewhere classified: Secondary | ICD-10-CM

## 2021-04-20 DIAGNOSIS — M25561 Pain in right knee: Secondary | ICD-10-CM

## 2021-04-20 DIAGNOSIS — M6281 Muscle weakness (generalized): Secondary | ICD-10-CM

## 2021-04-20 DIAGNOSIS — R2689 Other abnormalities of gait and mobility: Secondary | ICD-10-CM | POA: Diagnosis not present

## 2021-04-20 DIAGNOSIS — R600 Localized edema: Secondary | ICD-10-CM | POA: Diagnosis not present

## 2021-04-20 NOTE — Therapy (Signed)
[facility] Physical Therapy [address] [place], [place], [address] Phone: [phone]   Fax:  [phone]  Physical Therapy Treatment  Patient Details  Name: Kristin Murphy MRN: [id number] Date of Birth: [birth year] Referring Provider (PT): Julieanne Cotton, [place]   Encounter Date: 04/20/2021   PT End of Session - 04/20/21 1244     Visit Number 14    Number of Visits 25    Date for PT Re-Evaluation 05/30/21    PT Start Time 1151    PT Stop Time 1229    PT Time Calculation (min) 38 min    Activity Tolerance Patient tolerated treatment well;Patient limited by pain    Behavior During Therapy [facility] for tasks assessed/performed             Past Medical History:  Diagnosis Date   Systemic lupus erythematosus (HCC)     Past Surgical History:  Procedure Laterality Date   RENAL BIOPSY     WRIST SURGERY     biopsy    There were no vitals filed for this visit.   Subjective Assessment - 04/20/21 1243     Subjective knee is feeling better; walking with minimal limp now    Limitations Standing;Walking    Patient Stated Goals improve mobility. strength, return to work    Currently in Pain? Yes    Pain Score 2     Pain Location Knee    Pain Orientation Right    Pain Descriptors / Indicators Sore    Pain Type Acute pain;Surgical pain    Pain Onset More than a month ago    Pain Frequency Intermittent    Aggravating Factors  walking, SL stance    Pain Relieving Factors rest, ice                               [facility] Adult PT Treatment/Exercise - 04/20/21 1154       Knee/Hip Exercises: Aerobic   Recumbent Bike L7 x 8 min      Knee/Hip Exercises: Machines for Strengthening   Cybex Knee Extension RLE only 90-5 deg; 5# 3x10    Total Gym Leg Press 75# 3x15 single limb performed bil      Knee/Hip Exercises: Standing   SLS with Vectors RLE on foam with 1 UE support: LLE reach with squat forward/back/curtsey    Other Standing  Knee Exercises kickstand RLE deadlift with 12# KB 3x10                       PT Short Term Goals - 03/24/21 0930       PT SHORT TERM GOAL #1   Title Independent with initial HEP    Time 4    Period Weeks    Status Achieved    Target Date 03/23/21      PT SHORT TERM GOAL #2   Title Rt knee AROM improved 0-10-120 for improved function    Time 4    Period Weeks    Status Achieved    Target Date 03/23/21               PT Long Term Goals - 04/18/21 1004       PT LONG TERM GOAL #1   Title Independent with final HEP    Time 6    Period Weeks    Status On-going    Target Date 05/30/21  PT LONG TERM GOAL #2   Title Rt knee AROM improved 0-125 for improved function    Time 8    Period Weeks    Status Achieved      PT LONG TERM GOAL #3   Title Amb independently without significant deviations for improved function    Time 6    Period Weeks    Status On-going    Target Date 05/30/21      PT LONG TERM GOAL #4   Title Report pain < 2/10 for improved function    Time 6    Period Weeks    Status On-going    Target Date 05/30/21      PT LONG TERM GOAL #5   Title FOTO score improved to 71 for improved function    Time 6    Period Weeks    Status On-going    Target Date 05/30/21                   Plan - 04/20/21 1244     Clinical Impression Statement Pt tolerated session well today with continued focus on strengthening exercises.  She has difficulty with SL stance activities but tolerated well today without increase in pain.  Mod cues needed for technique and to decrease compensations.    Examination-Activity Limitations Bend;Squat;Stairs;Stand;Transfers;Lift;Locomotion Level    Examination-Participation Restrictions Community Activity;Shop;Driving;Occupation    PT Frequency 2x / week    PT Duration 6 weeks    PT Treatment/Interventions ADLs/Self Care Home Management;Cryotherapy;Electrical Stimulation;Moist Heat;Balance  training;Therapeutic exercise;Therapeutic activities;Functional mobility training;Stair training;Gait training;DME Instruction;Neuromuscular re-education;Patient/family education;Manual techniques;Passive range of motion;Vasopneumatic Device;Taping;Dry needling    PT Next Visit Plan balance and quad strengthening    PT Home Exercise Plan Access Code: [email or web address]    Consulted and Agree with Plan of Care Patient             Patient will benefit from skilled therapeutic intervention in order to improve the following deficits and impairments:  Abnormal gait, Decreased endurance, Pain, Decreased strength, Difficulty walking, Decreased mobility, Decreased balance, Increased edema, Decreased range of motion  Visit Diagnosis: Stiffness of right knee, not elsewhere classified  Acute pain of right knee  Other abnormalities of gait and mobility  Localized edema  Muscle weakness (generalized)     Problem List Patient Active Problem List   Diagnosis Date Noted   Lupus nephritis, ISN/RPS class V ([place]) 08/10/2020   Systemic lupus erythematosus (SLE) in adult ([place]) 11/12/2019   Other proteinuria 11/12/2019   Vitamin D deficiency 11/12/2019   History of asthma 11/12/2019   Seasonal allergies 11/12/2019   Former smoker 11/12/2019      Laureen Abrahams, PT, DPT 04/20/21 12:46 PM     [facility] [address] [place], [place], [address] Phone: [phone]   Fax:  [phone]  Name: DEBORIA APLEY MRN: [id number] Date of Birth: 04-11-1986

## 2021-04-25 ENCOUNTER — Encounter: Payer: PRIVATE HEALTH INSURANCE | Admitting: Physical Therapy

## 2021-04-26 ENCOUNTER — Encounter: Payer: Self-pay | Admitting: Physical Therapy

## 2021-04-26 ENCOUNTER — Ambulatory Visit (INDEPENDENT_AMBULATORY_CARE_PROVIDER_SITE_OTHER): Payer: PRIVATE HEALTH INSURANCE | Admitting: Rheumatology

## 2021-04-26 ENCOUNTER — Encounter: Payer: Self-pay | Admitting: Rheumatology

## 2021-04-26 ENCOUNTER — Ambulatory Visit (INDEPENDENT_AMBULATORY_CARE_PROVIDER_SITE_OTHER): Payer: PRIVATE HEALTH INSURANCE | Admitting: Physical Therapy

## 2021-04-26 ENCOUNTER — Other Ambulatory Visit: Payer: Self-pay

## 2021-04-26 VITALS — BP 128/81 | HR 73 | Ht 66.0 in | Wt 238.0 lb

## 2021-04-26 DIAGNOSIS — R600 Localized edema: Secondary | ICD-10-CM

## 2021-04-26 DIAGNOSIS — D708 Other neutropenia: Secondary | ICD-10-CM | POA: Diagnosis not present

## 2021-04-26 DIAGNOSIS — R2689 Other abnormalities of gait and mobility: Secondary | ICD-10-CM | POA: Diagnosis not present

## 2021-04-26 DIAGNOSIS — R808 Other proteinuria: Secondary | ICD-10-CM

## 2021-04-26 DIAGNOSIS — E559 Vitamin D deficiency, unspecified: Secondary | ICD-10-CM

## 2021-04-26 DIAGNOSIS — M329 Systemic lupus erythematosus, unspecified: Secondary | ICD-10-CM

## 2021-04-26 DIAGNOSIS — Z8709 Personal history of other diseases of the respiratory system: Secondary | ICD-10-CM

## 2021-04-26 DIAGNOSIS — M25561 Pain in right knee: Secondary | ICD-10-CM

## 2021-04-26 DIAGNOSIS — M6281 Muscle weakness (generalized): Secondary | ICD-10-CM

## 2021-04-26 DIAGNOSIS — B078 Other viral warts: Secondary | ICD-10-CM

## 2021-04-26 DIAGNOSIS — J302 Other seasonal allergic rhinitis: Secondary | ICD-10-CM

## 2021-04-26 DIAGNOSIS — M3214 Glomerular disease in systemic lupus erythematosus: Secondary | ICD-10-CM

## 2021-04-26 DIAGNOSIS — M25661 Stiffness of right knee, not elsewhere classified: Secondary | ICD-10-CM | POA: Diagnosis not present

## 2021-04-26 DIAGNOSIS — G8929 Other chronic pain: Secondary | ICD-10-CM

## 2021-04-26 DIAGNOSIS — Z79899 Other long term (current) drug therapy: Secondary | ICD-10-CM | POA: Diagnosis not present

## 2021-04-26 DIAGNOSIS — Z87891 Personal history of nicotine dependence: Secondary | ICD-10-CM

## 2021-04-26 NOTE — Therapy (Signed)
[facility] Physical Therapy [address] [place], [place], [address] Phone: [phone]   Fax:  [phone]  Physical Therapy Treatment  Patient Details  Name: Kristin Murphy MRN: [id number] Date of Birth: [birth year] Referring Provider (PT): Julieanne Cotton, [place]   Encounter Date: 04/26/2021   PT End of Session - 04/26/21 0846     Visit Number 15    Number of Visits 25    Date for PT Re-Evaluation 05/30/21    PT Start Time 0844    PT Stop Time 0930    PT Time Calculation (min) 46 min    Activity Tolerance Patient tolerated treatment well;Patient limited by pain    Behavior During Therapy [facility] for tasks assessed/performed             Past Medical History:  Diagnosis Date   Systemic lupus erythematosus (HCC)     Past Surgical History:  Procedure Laterality Date   RENAL BIOPSY     WRIST SURGERY     biopsy    There were no vitals filed for this visit.   Subjective Assessment - 04/26/21 0845     Subjective Her knee feels like it gives out when walking during turns.  She is working at gym daily and some at home.    Limitations Standing;Walking    Patient Stated Goals improve mobility. strength, return to work    Currently in Pain? No/denies    Pain Onset More than a month ago                               [facility] Adult PT Treatment/Exercise - 04/26/21 0844       Ambulation/Gait   Ambulation/Gait Yes    Stairs Yes    Stairs Assistance 5: Supervision    Stairs Assistance Details (indicate cue type and reason) demo & verbal cues on carryover of technique for step up & step down exercise    Stair Management Technique Alternating pattern;Forwards   light UE right rail 6 steps & left rail 5 steps   Number of Stairs 11    Height of Stairs 7      Knee/Hip Exercises: Stretches   Active Hamstring Stretch Both;2 reps;30 seconds    Active Hamstring Stretch Limitations standing with RLE extended & trunk flexion    Quad  Stretch Both;2 reps;30 seconds    Quad Stretch Limitations standing knee flex/strap cues for no hip flex    Gastroc Stretch Both;2 reps;30 seconds    Gastroc Stretch Limitations standing step w/ heel depression      Knee/Hip Exercises: Aerobic   Recumbent Bike seat 8 for 8 min level 5 for 3 min warm up, 30sec X 2 level 5 and 30sec X 2 level 7 resistance      Knee/Hip Exercises: Machines for Strengthening   Cybex Knee Extension RLE only 90-5 deg; 5# 3x10 quick concentric & slow eccentric    Total Gym Leg Press 75# 3x15 single limb performed BLEs swithing LE after sets;  cues for controlled full ext RLE      Knee/Hip Exercises: Standing   Forward Step Up Right;1 set;10 reps;Hand Hold: 1;Hand Hold: 0;Step Height: 6"   initial 4 reps 1 hand rail   Forward Step Up Limitations demo & verbal cues on technique    Step Down Right;1 set;10 reps;Hand Hold: 0;Hand Hold: 1;Step Height: 6"   first 4 reps 1 hand   Step Down Limitations demo &  verbal cues on technique    SLS with Vectors --    Other Standing Knee Exercises --                       PT Short Term Goals - 03/24/21 0930       PT SHORT TERM GOAL #1   Title Independent with initial HEP    Time 4    Period Weeks    Status Achieved    Target Date 03/23/21      PT SHORT TERM GOAL #2   Title Rt knee AROM improved 0-10-120 for improved function    Time 4    Period Weeks    Status Achieved    Target Date 03/23/21               PT Long Term Goals - 04/18/21 1004       PT LONG TERM GOAL #1   Title Independent with final HEP    Time 6    Period Weeks    Status On-going    Target Date 05/30/21      PT LONG TERM GOAL #2   Title Rt knee AROM improved 0-125 for improved function    Time 8    Period Weeks    Status Achieved      PT LONG TERM GOAL #3   Title Amb independently without significant deviations for improved function    Time 6    Period Weeks    Status On-going    Target Date 05/30/21      PT  LONG TERM GOAL #4   Title Report pain < 2/10 for improved function    Time 6    Period Weeks    Status On-going    Target Date 05/30/21      PT LONG TERM GOAL #5   Title FOTO score improved to 71 for improved function    Time 6    Period Weeks    Status On-going    Target Date 05/30/21                   Plan - 04/26/21 0847     Clinical Impression Statement PT educated on stretches to 3 primary muscles crossing knee & need to maintain flexibility in those muscles along with joint mobility. She appears to understand.  Pt reported issues with stairs so PT educated on step up & down technique. She was able to carryover to stairs with limited instructions.    Examination-Activity Limitations Bend;Squat;Stairs;Stand;Transfers;Lift;Locomotion Level    Examination-Participation Restrictions Community Activity;Shop;Driving;Occupation    PT Frequency 2x / week    PT Duration 6 weeks    PT Treatment/Interventions ADLs/Self Care Home Management;Cryotherapy;Electrical Stimulation;Moist Heat;Balance training;Therapeutic exercise;Therapeutic activities;Functional mobility training;Stair training;Gait training;DME Instruction;Neuromuscular re-education;Patient/family education;Manual techniques;Passive range of motion;Vasopneumatic Device;Taping;Dry needling    PT Next Visit Plan continue balance and quad strengthening    PT Home Exercise Plan Access Code: [email or web address]    Consulted and Agree with Plan of Care Patient             Patient will benefit from skilled therapeutic intervention in order to improve the following deficits and impairments:  Abnormal gait, Decreased endurance, Pain, Decreased strength, Difficulty walking, Decreased mobility, Decreased balance, Increased edema, Decreased range of motion  Visit Diagnosis: Stiffness of right knee, not elsewhere classified  Acute pain of right knee  Other abnormalities of gait and mobility  Localized edema  Muscle weakness  (generalized)  Problem List Patient Active Problem List   Diagnosis Date Noted   Lupus nephritis, ISN/RPS class V ([place]) 08/10/2020   Systemic lupus erythematosus (SLE) in adult [facility]) 11/12/2019   Other proteinuria 11/12/2019   Vitamin D deficiency 11/12/2019   History of asthma 11/12/2019   Seasonal allergies 11/12/2019   Former smoker 11/12/2019    Jamey Reas, PT, DPT 04/26/2021, 9:28 AM  [facility] Physical Therapy [address] [place], [place], [address] Phone: [phone]   Fax:  [phone]  Name: JOSETTA FEIG MRN: [id number] Date of Birth: 09/15/1985

## 2021-04-26 NOTE — Patient Instructions (Addendum)
Standing Labs We placed an order today for your standing lab work.   Please have your standing labs drawn in January and every 3 months  If possible, please have your labs drawn 2 weeks prior to your appointment so that the provider can discuss your results at your appointment.  Please note that you may see your imaging and lab results in MyChart before we have reviewed them. We may be awaiting multiple results to interpret others before contacting you. Please allow our office up to 72 hours to thoroughly review all of the results before contacting the office for clarification of your results.  We have open lab daily: Monday through Thursday from 1:30-4:30 PM and Friday from 1:30-4:00 PM at the office of Dr. Pollyann Savoy, [facility].   Please be advised, all patients with office appointments requiring lab work will take precedent over walk-in lab work.  If possible, please come for your lab work on Monday and Friday afternoons, as you may experience shorter wait times. The office is located at [address], [address], [place], [place] [address] No appointment is necessary.   Labs are drawn by [facility]. Please bring your co-pay at the time of your lab draw.  You may receive a bill from [facility] for your lab work.  If you wish to have your labs drawn at another location, please call the office 24 hours in advance to send orders.  If you have any questions regarding directions or hours of operation,  please call [phone].   As a reminder, please drink plenty of water prior to coming for your lab work. Thanks!   Vaccines You are taking a medication(s) that can suppress your immune system.  The following immunizations are recommended: Flu annually Covid-19  Td/Tdap (tetanus, diphtheria, pertussis) every 10 years Pneumonia (Prevnar 15 then Pneumovax 23 at least 1 year apart.  Alternatively, can take Prevnar 20 without needing additional dose) Shingrix: 2 doses from 4 weeks  to 6 months apart  Please check with your PCP to make sure you are up to date.

## 2021-04-27 ENCOUNTER — Ambulatory Visit (INDEPENDENT_AMBULATORY_CARE_PROVIDER_SITE_OTHER): Payer: PRIVATE HEALTH INSURANCE | Admitting: Physical Therapy

## 2021-04-27 ENCOUNTER — Encounter: Payer: Self-pay | Admitting: Physical Therapy

## 2021-04-27 DIAGNOSIS — R600 Localized edema: Secondary | ICD-10-CM

## 2021-04-27 DIAGNOSIS — M25561 Pain in right knee: Secondary | ICD-10-CM

## 2021-04-27 DIAGNOSIS — M25661 Stiffness of right knee, not elsewhere classified: Secondary | ICD-10-CM | POA: Diagnosis not present

## 2021-04-27 DIAGNOSIS — R2689 Other abnormalities of gait and mobility: Secondary | ICD-10-CM | POA: Diagnosis not present

## 2021-04-27 DIAGNOSIS — M6281 Muscle weakness (generalized): Secondary | ICD-10-CM

## 2021-04-27 NOTE — Therapy (Signed)
[facility] [address] [place], [place], [address] Phone: [phone]   Fax:  [phone]  Physical Therapy Treatment  Patient Details  Name: Kristin Murphy MRN: [id number] Date of Birth: 04-04-1986 Referring Provider (PT): Donella Stade, [place]   Encounter Date: 04/27/2021   PT End of Session - 04/27/21 1010     Visit Number 16    Number of Visits 25    Date for PT Re-Evaluation 05/30/21    PT Start Time 0932    PT Stop Time 1010    PT Time Calculation (min) 38 min    Activity Tolerance Patient tolerated treatment well;Patient limited by pain    Behavior During Therapy [facility] for tasks assessed/performed             Past Medical History:  Diagnosis Date   Systemic lupus erythematosus ([place])     Past Surgical History:  Procedure Laterality Date   KNEE ARTHROSCOPY Right 2022   RENAL BIOPSY     WRIST SURGERY     biopsy    There were no vitals filed for this visit.   Subjective Assessment - 04/27/21 0934     Subjective doing well, no pain in knee, it's still tight    Limitations Standing;Walking    Patient Stated Goals improve mobility. strength, return to work    Pain Onset More than a month ago                               [facility] Adult PT Treatment/Exercise - 04/27/21 0935       Knee/Hip Exercises: Stretches   Gastroc Stretch Both;2 reps;30 seconds    Gastroc Stretch Limitations standing step w/ heel depression      Knee/Hip Exercises: Aerobic   Recumbent Bike L8 x 8 min      Knee/Hip Exercises: Machines for Strengthening   Cybex Knee Extension RLE only 90-5 deg; 5# 3x10 quick concentric & slow eccentric    Total Gym Leg Press 75# 3x15 single limb performed BLEs swithing LE after sets;  cues for controlled full ext RLE      Knee/Hip Exercises: Standing   Side Lunges Both;3 sets;10 reps    Side Lunges Limitations 12# KB    Lunge Walking - Round Trips 20' x 2 with 12# KB and trunk  rotation                       PT Short Term Goals - 03/24/21 0930       PT SHORT TERM GOAL #1   Title Independent with initial HEP    Time 4    Period Weeks    Status Achieved    Target Date 03/23/21      PT SHORT TERM GOAL #2   Title Rt knee AROM improved 0-10-120 for improved function    Time 4    Period Weeks    Status Achieved    Target Date 03/23/21               PT Long Term Goals - 04/18/21 1004       PT LONG TERM GOAL #1   Title Independent with final HEP    Time 6    Period Weeks    Status On-going    Target Date 05/30/21      PT LONG TERM GOAL #2   Title Rt knee AROM improved 0-125 for improved  function    Time 8    Period Weeks    Status Achieved      PT LONG TERM GOAL #3   Title Amb independently without significant deviations for improved function    Time 6    Period Weeks    Status On-going    Target Date 05/30/21      PT LONG TERM GOAL #4   Title Report pain < 2/10 for improved function    Time 6    Period Weeks    Status On-going    Target Date 05/30/21      PT LONG TERM GOAL #5   Title FOTO score improved to 71 for improved function    Time 6    Period Weeks    Status On-going    Target Date 05/30/21                   Plan - 04/27/21 1010     Clinical Impression Statement Continued focus on strengthening today with addition of lateral movements which she tolerated well.  Will continue to benefit from PT to maximize function.    Examination-Activity Limitations Bend;Squat;Stairs;Stand;Transfers;Lift;Locomotion Level    Examination-Participation Restrictions Community Activity;Shop;Driving;Occupation    PT Frequency 2x / week    PT Duration 6 weeks    PT Treatment/Interventions ADLs/Self Care Home Management;Cryotherapy;Electrical Stimulation;Moist Heat;Balance training;Therapeutic exercise;Therapeutic activities;Functional mobility training;Stair training;Gait training;DME Instruction;Neuromuscular  re-education;Patient/family education;Manual techniques;Passive range of motion;Vasopneumatic Device;Taping;Dry needling    PT Next Visit Plan continue balance and quad strengthening, needs MD note    PT Home Exercise Plan Access Code: [email or web address]    Consulted and Agree with Plan of Care Patient             Patient will benefit from skilled therapeutic intervention in order to improve the following deficits and impairments:  Abnormal gait, Decreased endurance, Pain, Decreased strength, Difficulty walking, Decreased mobility, Decreased balance, Increased edema, Decreased range of motion  Visit Diagnosis: Stiffness of right knee, not elsewhere classified  Acute pain of right knee  Other abnormalities of gait and mobility  Localized edema  Muscle weakness (generalized)     Problem List Patient Active Problem List   Diagnosis Date Noted   Lupus nephritis, ISN/RPS class V (HCC) 08/10/2020   Systemic lupus erythematosus (SLE) in adult (HCC) 11/12/2019   Other proteinuria 11/12/2019   Vitamin D deficiency 11/12/2019   History of asthma 11/12/2019   Seasonal allergies 11/12/2019   Former smoker 11/12/2019      Clarita Crane, PT, DPT 04/27/21 10:11 AM     [facility] [address] [place], [place], [address] Phone: [phone]   Fax:  [phone]  Name: Kristin Murphy MRN: [id number] Date of Birth: [birth year]

## 2021-05-04 ENCOUNTER — Ambulatory Visit (INDEPENDENT_AMBULATORY_CARE_PROVIDER_SITE_OTHER): Payer: PRIVATE HEALTH INSURANCE | Admitting: Physical Therapy

## 2021-05-04 ENCOUNTER — Other Ambulatory Visit: Payer: Self-pay

## 2021-05-04 ENCOUNTER — Encounter: Payer: Self-pay | Admitting: Physical Therapy

## 2021-05-04 DIAGNOSIS — M25661 Stiffness of right knee, not elsewhere classified: Secondary | ICD-10-CM

## 2021-05-04 DIAGNOSIS — R2689 Other abnormalities of gait and mobility: Secondary | ICD-10-CM

## 2021-05-04 DIAGNOSIS — M25561 Pain in right knee: Secondary | ICD-10-CM | POA: Diagnosis not present

## 2021-05-04 DIAGNOSIS — R600 Localized edema: Secondary | ICD-10-CM | POA: Diagnosis not present

## 2021-05-04 DIAGNOSIS — M6281 Muscle weakness (generalized): Secondary | ICD-10-CM

## 2021-05-04 NOTE — Therapy (Signed)
[facility] [address] [place], [place], [address] Phone: [phone]   Fax:  [phone]  Physical Therapy Treatment  Patient Details  Name: Kristin Murphy MRN: [id number] Date of Birth: 09-Apr-1986 Referring Provider (PT): Donella Stade, [place]   Encounter Date: 05/04/2021   PT End of Session - 05/04/21 0842     Visit Number 17    Number of Visits 25    Date for PT Re-Evaluation 05/30/21    PT Start Time 0802    PT Stop Time 0840    PT Time Calculation (min) 38 min    Activity Tolerance Patient tolerated treatment well;Patient limited by pain    Behavior During Therapy [facility] for tasks assessed/performed             Past Medical History:  Diagnosis Date   Systemic lupus erythematosus ([place])     Past Surgical History:  Procedure Laterality Date   KNEE ARTHROSCOPY Right 2022   RENAL BIOPSY     WRIST SURGERY     biopsy    There were no vitals filed for this visit.   Subjective Assessment - 05/04/21 0803     Subjective walking without deviations today.  "It feels good today."    Limitations Standing;Walking    Patient Stated Goals improve mobility. strength, return to work    Currently in Pain? No/denies                               [facility] Adult PT Treatment/Exercise - 05/04/21 0804       Knee/Hip Exercises: Aerobic   Recumbent Bike L8 x 8 min      Knee/Hip Exercises: Machines for Strengthening   Cybex Knee Extension RLE only 90-5 deg; 5# 3x15 quick concentric & slow eccentric    Total Gym Leg Press 75# 3x15 single limb performed BLEs swithing LE after sets;  cues for controlled full ext RLE      Knee/Hip Exercises: Standing   Other Standing Knee Exercises deceleration off ramp without weight x20; with 12# KB 2x10                       PT Short Term Goals - 03/24/21 0930       PT SHORT TERM GOAL #1   Title Independent with initial HEP    Time 4    Period Weeks    Status  Achieved    Target Date 03/23/21      PT SHORT TERM GOAL #2   Title Rt knee AROM improved 0-10-120 for improved function    Time 4    Period Weeks    Status Achieved    Target Date 03/23/21               PT Long Term Goals - 04/18/21 1004       PT LONG TERM GOAL #1   Title Independent with final HEP    Time 6    Period Weeks    Status On-going    Target Date 05/30/21      PT LONG TERM GOAL #2   Title Rt knee AROM improved 0-125 for improved function    Time 8    Period Weeks    Status Achieved      PT LONG TERM GOAL #3   Title Amb independently without significant deviations for improved function    Time 6  Period Weeks    Status On-going    Target Date 05/30/21      PT LONG TERM GOAL #4   Title Report pain < 2/10 for improved function    Time 6    Period Weeks    Status On-going    Target Date 05/30/21      PT LONG TERM GOAL #5   Title FOTO score improved to 71 for improved function    Time 6    Period Weeks    Status On-going    Target Date 05/30/21                   Plan - 05/04/21 0842     Clinical Impression Statement Added deceleration activities today with good tolerance today and no reports of pain.  Will continue to benefit from continued strengthening to maximize function.  Overall improved gait mechanics noted today and progressing well with PT.    Examination-Activity Limitations Bend;Squat;Stairs;Stand;Transfers;Lift;Locomotion Level    Examination-Participation Restrictions Community Activity;Shop;Driving;Occupation    PT Frequency 2x / week    PT Duration 6 weeks    PT Treatment/Interventions ADLs/Self Care Home Management;Cryotherapy;Electrical Stimulation;Moist Heat;Balance training;Therapeutic exercise;Therapeutic activities;Functional mobility training;Stair training;Gait training;DME Instruction;Neuromuscular re-education;Patient/family education;Manual techniques;Passive range of motion;Vasopneumatic Device;Taping;Dry  needling    PT Next Visit Plan continue balance and quad strengthening, deceleration, agility exercises    PT Home Exercise Plan Access Code: [email or web address]    Consulted and Agree with Plan of Care Patient             Patient will benefit from skilled therapeutic intervention in order to improve the following deficits and impairments:  Abnormal gait, Decreased endurance, Pain, Decreased strength, Difficulty walking, Decreased mobility, Decreased balance, Increased edema, Decreased range of motion  Visit Diagnosis: Stiffness of right knee, not elsewhere classified  Acute pain of right knee  Other abnormalities of gait and mobility  Localized edema  Muscle weakness (generalized)     Problem List Patient Active Problem List   Diagnosis Date Noted   Lupus nephritis, ISN/RPS class V (HCC) 08/10/2020   Systemic lupus erythematosus (SLE) in adult (HCC) 11/12/2019   Other proteinuria 11/12/2019   Vitamin D deficiency 11/12/2019   History of asthma 11/12/2019   Seasonal allergies 11/12/2019   Former smoker 11/12/2019      Clarita Crane, PT, DPT 05/04/21 8:44 AM     [facility] [address] [place], [place], [address] Phone: [phone]   Fax:  [phone]  Name: ARIELY RIDDELL MRN: [id number] Date of Birth: Apr 07, 1986

## 2021-05-05 ENCOUNTER — Ambulatory Visit (INDEPENDENT_AMBULATORY_CARE_PROVIDER_SITE_OTHER): Payer: PRIVATE HEALTH INSURANCE | Admitting: Orthopedic Surgery

## 2021-05-05 DIAGNOSIS — Z9889 Other specified postprocedural states: Secondary | ICD-10-CM

## 2021-05-06 ENCOUNTER — Ambulatory Visit (INDEPENDENT_AMBULATORY_CARE_PROVIDER_SITE_OTHER): Payer: PRIVATE HEALTH INSURANCE | Admitting: Rehabilitative and Restorative Service Providers"

## 2021-05-06 ENCOUNTER — Encounter: Payer: Self-pay | Admitting: Rehabilitative and Restorative Service Providers"

## 2021-05-06 ENCOUNTER — Other Ambulatory Visit: Payer: Self-pay

## 2021-05-06 ENCOUNTER — Encounter: Payer: Self-pay | Admitting: Orthopedic Surgery

## 2021-05-06 DIAGNOSIS — M25661 Stiffness of right knee, not elsewhere classified: Secondary | ICD-10-CM | POA: Diagnosis not present

## 2021-05-06 DIAGNOSIS — M25561 Pain in right knee: Secondary | ICD-10-CM | POA: Diagnosis not present

## 2021-05-06 DIAGNOSIS — M6281 Muscle weakness (generalized): Secondary | ICD-10-CM

## 2021-05-06 DIAGNOSIS — R2689 Other abnormalities of gait and mobility: Secondary | ICD-10-CM

## 2021-05-06 DIAGNOSIS — R600 Localized edema: Secondary | ICD-10-CM

## 2021-05-06 NOTE — Progress Notes (Signed)
   Post-Op Visit Note   Patient: Kristin Murphy           Date of Birth: 02/27/1986           MRN: [id number] Visit Date: 05/05/2021 PCP: [facility]   Assessment & Plan:  Chief Complaint:  Chief Complaint  Patient presents with   Other    Follow up knee   Visit Diagnoses:  1. Status post arthroscopy of right knee     Plan:  patient presents for evaluation of right knee arthroscopy microfracture for chondral defect performed 01/18/2021.  Overall she is doing well.  She is working on Print production planner and physical therapy.  Balance is improving.  On exam she has no effusion and excellent range of motion.  Quad strength is also improving.  I like her to hold off on squats and lunges due to possible patellofemoral issues.  She does have some patellofemoral crepitus on exam more on the right than the left.  Elliptical okay.  Would not encourage running due to the fibrocartilage fill of the chondral defect.  Follow-up as needed.  Follow-Up Instructions: Return if symptoms worsen or fail to improve.   Orders:  No orders of the defined types were placed in this encounter.  No orders of the defined types were placed in this encounter.   Imaging: No results found.  PMFS History: Patient Active Problem List   Diagnosis Date Noted   Lupus nephritis, ISN/RPS class V (HCC) 08/10/2020   Systemic lupus erythematosus (SLE) in adult (HCC) 11/12/2019   Other proteinuria 11/12/2019   Vitamin D deficiency 11/12/2019   History of asthma 11/12/2019   Seasonal allergies 11/12/2019   Former smoker 11/12/2019   Past Medical History:  Diagnosis Date   Systemic lupus erythematosus (HCC)     Family History  Problem Relation Age of Onset   Hypertension Mother    Cancer Mother    Arthritis Father    Hypertension Other    Diabetes Other    Arthritis Other    Sarcoidosis Paternal Grandmother    Sarcoidosis Other     Past Surgical History:  Procedure Laterality Date   KNEE  ARTHROSCOPY Right 2022   RENAL BIOPSY     WRIST SURGERY     biopsy   Social History   Occupational History   Not on file  Tobacco Use   Smoking status: Former    Types: Cigarettes   Smokeless tobacco: Never   Tobacco 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

## 2021-05-06 NOTE — Therapy (Signed)
[facility] [address] [place], [place], [address] Phone: [phone]   Fax:  [phone]  Physical Therapy Treatment  Patient Details  Name: MEEGAN LATTNER MRN: [id number] Date of Birth: 01/16/1986 Referring Provider (PT): Julieanne Cotton, [place]   Encounter Date: 05/06/2021   PT End of Session - 05/06/21 0814     Visit Number 18    Number of Visits 25    Date for PT Re-Evaluation 05/30/21    PT Start Time 0758    PT Stop Time 0838    PT Time Calculation (min) 40 min    Activity Tolerance Patient tolerated treatment well    Behavior During Therapy [facility] for tasks assessed/performed             Past Medical History:  Diagnosis Date   Systemic lupus erythematosus (HCC)     Past Surgical History:  Procedure Laterality Date   KNEE ARTHROSCOPY Right 2022   RENAL BIOPSY     WRIST SURGERY     biopsy    There were no vitals filed for this visit.   Subjective Assessment - 05/06/21 0807     Subjective Pt. indicated general soreness upon arrival today, noted in other body parts as well.    Limitations Standing;Walking    Patient Stated Goals improve mobility. strength, return to work    Currently in Pain? No/denies    Pain Score 0-No pain                [facility] PT Assessment - 05/06/21 0001       Assessment   Medical Diagnosis Z98.890 (ICD-10-CM) - Status post arthroscopy of right knee    Referring Provider (PT) Magnant, Joycie Peek, PA-C    Onset Date/Surgical Date 01/18/21    Hand Dominance Right      Strength   Overall Strength Comments Dynamometry recorded in lbs, measured in 60 deg knee flexion in sitting    Strength Assessment Site Knee    Right/Left Knee Left;Right    Right Knee Extension --   31.3, 33 lbs   Left Knee Extension --   37, 38 lbs                          [facility] Adult PT Treatment/Exercise - 05/06/21 0001       Knee/Hip Exercises: Aerobic   Recumbent Bike Lvl 8 10 mins, seat  8      Knee/Hip Exercises: Machines for Strengthening   Cybex Knee Extension RLE only 90-5 deg; 5# 3x15 quick concentric & slow eccentric    Total Gym Leg Press 75# 3x15 single limb performed BLEs switching LE after sets      Knee/Hip Exercises: Standing   Side Lunges Both;3 sets;10 reps    Side Lunges Limitations 12# KB    Other Standing Knee Exercises deceleration off ramp  with 12# KB 2x10 for Rt leg                       PT Short Term Goals - 03/24/21 0930       PT SHORT TERM GOAL #1   Title Independent with initial HEP    Time 4    Period Weeks    Status Achieved    Target Date 03/23/21      PT SHORT TERM GOAL #2   Title Rt knee AROM improved 0-10-120 for improved function    Time 4  Period Weeks    Status Achieved    Target Date 03/23/21               PT Long Term Goals - 04/18/21 1004       PT LONG TERM GOAL #1   Title Independent with final HEP    Time 6    Period Weeks    Status On-going    Target Date 05/30/21      PT LONG TERM GOAL #2   Title Rt knee AROM improved 0-125 for improved function    Time 8    Period Weeks    Status Achieved      PT LONG TERM GOAL #3   Title Amb independently without significant deviations for improved function    Time 6    Period Weeks    Status On-going    Target Date 05/30/21      PT LONG TERM GOAL #4   Title Report pain < 2/10 for improved function    Time 6    Period Weeks    Status On-going    Target Date 05/30/21      PT LONG TERM GOAL #5   Title FOTO score improved to 71 for improved function    Time 6    Period Weeks    Status On-going    Target Date 05/30/21                   Plan - 05/06/21 0820     Clinical Impression Statement Continued plan within strengthening and control intervention to promote improved quad strength and movement control improvements to facilitate progression towards goals and overall function.  From MD visit yesterday, Pt. reported still having  recommendation of not running but elliptical was noted as allowed.    Examination-Activity Limitations Bend;Squat;Stairs;Stand;Transfers;Lift;Locomotion Level    Examination-Participation Restrictions Community Activity;Shop;Driving;Occupation    PT Frequency 2x / week    PT Duration 6 weeks    PT Treatment/Interventions ADLs/Self Care Home Management;Cryotherapy;Electrical Stimulation;Moist Heat;Balance training;Therapeutic exercise;Therapeutic activities;Functional mobility training;Stair training;Gait training;DME Instruction;Neuromuscular re-education;Patient/family education;Manual techniques;Passive range of motion;Vasopneumatic Device;Taping;Dry needling    PT Next Visit Plan dynamic/compliant surface balance and progress quad strengthening in closed and open chain as tolerated    PT Home Exercise Plan Access Code: [email or web address]    Consulted and Agree with Plan of Care Patient             Patient will benefit from skilled therapeutic intervention in order to improve the following deficits and impairments:  Abnormal gait, Decreased endurance, Pain, Decreased strength, Difficulty walking, Decreased mobility, Decreased balance, Increased edema, Decreased range of motion  Visit Diagnosis: Stiffness of right knee, not elsewhere classified  Acute pain of right knee  Other abnormalities of gait and mobility  Localized edema  Muscle weakness (generalized)     Problem List Patient Active Problem List   Diagnosis Date Noted   Lupus nephritis, ISN/RPS class V (HCC) 08/10/2020   Systemic lupus erythematosus (SLE) in adult (HCC) 11/12/2019   Other proteinuria 11/12/2019   Vitamin D deficiency 11/12/2019   History of asthma 11/12/2019   Seasonal allergies 11/12/2019   Former smoker 11/12/2019   Chyrel Masson, PT, DPT, OCS, ATC 05/06/21  8:32 AM    [facility] [address] [place], [place], [address] Phone: [phone]   Fax:   [phone]  Name: KARRIGAN MESSAMORE MRN: [id number] Date of Birth: 10/15/1985

## 2021-05-11 ENCOUNTER — Other Ambulatory Visit: Payer: Self-pay

## 2021-05-11 ENCOUNTER — Ambulatory Visit (INDEPENDENT_AMBULATORY_CARE_PROVIDER_SITE_OTHER): Payer: PRIVATE HEALTH INSURANCE | Admitting: Rehabilitative and Restorative Service Providers"

## 2021-05-11 ENCOUNTER — Encounter: Payer: Self-pay | Admitting: Rehabilitative and Restorative Service Providers"

## 2021-05-11 DIAGNOSIS — R600 Localized edema: Secondary | ICD-10-CM | POA: Diagnosis not present

## 2021-05-11 DIAGNOSIS — M25561 Pain in right knee: Secondary | ICD-10-CM

## 2021-05-11 DIAGNOSIS — R2689 Other abnormalities of gait and mobility: Secondary | ICD-10-CM

## 2021-05-11 DIAGNOSIS — M25661 Stiffness of right knee, not elsewhere classified: Secondary | ICD-10-CM

## 2021-05-11 DIAGNOSIS — M6281 Muscle weakness (generalized): Secondary | ICD-10-CM

## 2021-05-11 NOTE — Therapy (Signed)
[facility] Therapy [address] [place], [place], [address] Phone: [phone]   Fax:  [phone]  Physical Therapy Treatment  Patient Details  Name: Kristin Murphy MRN: [id number] Date of Birth: 09-17-1985 Referring Provider (PT): Donella Stade, [place]   Encounter Date: 05/11/2021   PT End of Session - 05/11/21 0809     Visit Number 19    Number of Visits 25    Date for PT Re-Evaluation 05/30/21    PT Start Time 0805    PT Stop Time [id number]    PT Time Calculation (min) 39 min    Activity Tolerance Patient tolerated treatment well    Behavior During Therapy [facility] for tasks assessed/performed             Past Medical History:  Diagnosis Date   Systemic lupus erythematosus ([place])     Past Surgical History:  Procedure Laterality Date   KNEE ARTHROSCOPY Right 2022   RENAL BIOPSY     WRIST SURGERY     biopsy    There were no vitals filed for this visit.   Subjective Assessment - 05/11/21 0809     Subjective Pt. stated no pain since last visit.  Some discomfort with standing prolonged in the same spot.    Limitations Standing;Walking    Patient Stated Goals improve mobility. strength, return to work    Currently in Pain? No/denies    Pain Score 0-No pain                               [facility] Adult PT Treatment/Exercise - 05/11/21 0001       Knee/Hip Exercises: Aerobic   Recumbent Bike Lvl 8 10 mins seat 7      Knee/Hip Exercises: Machines for Strengthening   Cybex Knee Extension single leg only 90-5 deg; 5# 3x15 quick concentric & slow eccentric, performed bilateral    Total Gym Leg Press 87# 3x15 single limb performed BLEs switching LE after sets      Knee/Hip Exercises: Standing   Side Lunges Both;20 reps    Side Lunges Limitations 12 lbs    Other Standing Knee Exercises deceleration off ramp  with 15# KB 2x10 for Rt leg    Other Standing Knee Exercises kickstand single leg deadlift with 12# KB 3x10,  performed bilateral                       PT Short Term Goals - 03/24/21 0930       PT SHORT TERM GOAL #1   Title Independent with initial HEP    Time 4    Period Weeks    Status Achieved    Target Date 03/23/21      PT SHORT TERM GOAL #2   Title Rt knee AROM improved 0-10-120 for improved function    Time 4    Period Weeks    Status Achieved    Target Date 03/23/21               PT Long Term Goals - 04/18/21 1004       PT LONG TERM GOAL #1   Title Independent with final HEP    Time 6    Period Weeks    Status On-going    Target Date 05/30/21      PT LONG TERM GOAL #2   Title Rt knee AROM improved 0-125 for improved function  Time 8    Period Weeks    Status Achieved      PT LONG TERM GOAL #3   Title Amb independently without significant deviations for improved function    Time 6    Period Weeks    Status On-going    Target Date 05/30/21      PT LONG TERM GOAL #4   Title Report pain < 2/10 for improved function    Time 6    Period Weeks    Status On-going    Target Date 05/30/21      PT LONG TERM GOAL #5   Title FOTO score improved to 71 for improved function    Time 6    Period Weeks    Status On-going    Target Date 05/30/21                   Plan - 05/11/21 0809     Clinical Impression Statement Cues for stability in lumbar spine to improve performance on RDLs to reduce any back strain.  Pt. performed well with additional resistance on select interventions. Continued skilled PT services indicated.  Mild discomforts noted in loading in deceleration on Rt leg today.  Cues given to limit anterior translation in landing with mild improvements but still some mild complaints noted at times in activity.    Examination-Activity Limitations Bend;Squat;Stairs;Stand;Transfers;Lift;Locomotion Level    Examination-Participation Restrictions Community Activity;Shop;Driving;Occupation    PT Frequency 2x / week    PT Duration 6 weeks     PT Treatment/Interventions ADLs/Self Care Home Management;Cryotherapy;Electrical Stimulation;Moist Heat;Balance training;Therapeutic exercise;Therapeutic activities;Functional mobility training;Stair training;Gait training;DME Instruction;Neuromuscular re-education;Patient/family education;Manual techniques;Passive range of motion;Vasopneumatic Device;Taping;Dry needling    PT Next Visit Plan Long term goal reassessment, objective strength/data collection    PT Home Exercise Plan Access Code: [facility]    Consulted and Agree with Plan of Care Patient             Patient will benefit from skilled therapeutic intervention in order to improve the following deficits and impairments:  Abnormal gait, Decreased endurance, Pain, Decreased strength, Difficulty walking, Decreased mobility, Decreased balance, Increased edema, Decreased range of motion  Visit Diagnosis: Stiffness of right knee, not elsewhere classified  Acute pain of right knee  Other abnormalities of gait and mobility  Localized edema  Muscle weakness (generalized)     Problem List Patient Active Problem List   Diagnosis Date Noted   Lupus nephritis, ISN/RPS class V (HCC) 08/10/2020   Systemic lupus erythematosus (SLE) in adult (HCC) 11/12/2019   Other proteinuria 11/12/2019   Vitamin D deficiency 11/12/2019   History of asthma 11/12/2019   Seasonal allergies 11/12/2019   Former smoker 11/12/2019    Chyrel Masson, PT, DPT, OCS, ATC 05/11/21  8:42 AM    [facility] [address] [place], [place], [address] Phone: [phone]   Fax:  [phone]  Name: Kristin Murphy MRN: [id number] Date of Birth: 09/27/1985

## 2021-05-13 ENCOUNTER — Other Ambulatory Visit: Payer: Self-pay

## 2021-05-13 ENCOUNTER — Ambulatory Visit (INDEPENDENT_AMBULATORY_CARE_PROVIDER_SITE_OTHER): Payer: PRIVATE HEALTH INSURANCE | Admitting: Rehabilitative and Restorative Service Providers"

## 2021-05-13 ENCOUNTER — Encounter: Payer: Self-pay | Admitting: Rehabilitative and Restorative Service Providers"

## 2021-05-13 DIAGNOSIS — R600 Localized edema: Secondary | ICD-10-CM

## 2021-05-13 DIAGNOSIS — M25561 Pain in right knee: Secondary | ICD-10-CM

## 2021-05-13 DIAGNOSIS — R2689 Other abnormalities of gait and mobility: Secondary | ICD-10-CM

## 2021-05-13 DIAGNOSIS — M25661 Stiffness of right knee, not elsewhere classified: Secondary | ICD-10-CM

## 2021-05-13 DIAGNOSIS — M6281 Muscle weakness (generalized): Secondary | ICD-10-CM

## 2021-05-13 NOTE — Therapy (Signed)
[facility] Therapy [address] [place], [place], [address] Phone: [phone]   Fax:  [phone]  Physical Therapy Treatment  Patient Details  Name: MALAISHA KNOPIK MRN: [id number] Date of Birth: [birth year] Referring Provider (PT): Donella Stade, [place]   Encounter Date: 05/13/2021   PT End of Session - 05/13/21 0807     Visit Number 20    Number of Visits 25    Date for PT Re-Evaluation 05/30/21    PT Start Time 0759    PT Stop Time 0840    PT Time Calculation (min) 41 min    Activity Tolerance Patient tolerated treatment well    Behavior During Therapy [facility] for tasks assessed/performed             Past Medical History:  Diagnosis Date   Systemic lupus erythematosus ([place])     Past Surgical History:  Procedure Laterality Date   KNEE ARTHROSCOPY Right 2022   RENAL BIOPSY     WRIST SURGERY     biopsy    There were no vitals filed for this visit.   Subjective Assessment - 05/13/21 0806     Subjective Pt. indicated no particular complaints upon arrival.    Limitations Standing;Walking    Patient Stated Goals improve mobility. strength, return to work    Currently in Pain? No/denies    Pain Score 0-No pain                [facility] PT Assessment - 05/13/21 0001       Assessment   Medical Diagnosis Z98.890 (ICD-10-CM) - Status post arthroscopy of right knee    Referring Provider (PT) Magnant, Gerrianne Scale, PA-C    Onset Date/Surgical Date 01/18/21    Hand Dominance Right      Observation/Other Assessments   Focus on Therapeutic Outcomes (FOTO)  57 update                           [facility] Adult PT Treatment/Exercise - 05/13/21 0001       Neuro Re-ed    Neuro Re-ed Details  single leg stance ball toss x 20 3 rounds bilateral.  single leg stance on Rt c ball circles with other leg x 5      Knee/Hip Exercises: Aerobic   Recumbent Bike Lvl 8 9 mins seat 7      Knee/Hip Exercises: Machines for  Strengthening   Cybex Knee Extension Double leg up, single leg down Rt leg 3 x 10 10 lbs      Knee/Hip Exercises: Standing   Other Standing Knee Exercises agility ladder:  step to pattern fwd 10 ft x 6 each leg leading, lateral shuffle 10 ft x 6 each way, diagonal fwd 10 ft x 12                       PT Short Term Goals - 03/24/21 0930       PT SHORT TERM GOAL #1   Title Independent with initial HEP    Time 4    Period Weeks    Status Achieved    Target Date 03/23/21      PT SHORT TERM GOAL #2   Title Rt knee AROM improved 0-10-120 for improved function    Time 4    Period Weeks    Status Achieved    Target Date 03/23/21  PT Long Term Goals - 05/13/21 0839       PT LONG TERM GOAL #1   Title Independent with final HEP    Time 6    Period Weeks    Status On-going    Target Date 05/30/21      PT LONG TERM GOAL #2   Title Rt knee AROM improved 0-125 for improved function    Time 8    Period Weeks    Status Achieved      PT LONG TERM GOAL #3   Title Amb independently without significant deviations for improved function    Time 6    Period Weeks    Status Achieved    Target Date 05/30/21      PT LONG TERM GOAL #4   Title Report pain < 2/10 for improved function    Time 6    Period Weeks    Status On-going    Target Date 05/30/21      PT LONG TERM GOAL #5   Title FOTO score improved to 71 for improved function    Time 6    Period Weeks    Status On-going    Target Date 05/30/21                   Plan - 05/13/21 0830     Clinical Impression Statement No complaints in agility based intervention performed at medium speed to improve control.  Increase in resistance on LAQ machine produced some mild anterior knee complaints at last 5 to 10 degrees.  Cues given to reduce to only about 15 deg and symptoms were reduced. Single leg balance control to be improved.    Examination-Activity Limitations  Bend;Squat;Stairs;Stand;Transfers;Lift;Locomotion Level    Examination-Participation Restrictions Community Activity;Shop;Driving;Occupation    PT Frequency 2x / week    PT Duration 6 weeks    PT Treatment/Interventions ADLs/Self Care Home Management;Cryotherapy;Electrical Stimulation;Moist Heat;Balance training;Therapeutic exercise;Therapeutic activities;Functional mobility training;Stair training;Gait training;DME Instruction;Neuromuscular re-education;Patient/family education;Manual techniques;Passive range of motion;Vasopneumatic Device;Taping;Dry needling    PT Next Visit Plan Progressive strengthening continued, improved balance control.    PT Home Exercise Plan Access Code: [email or web address]    Consulted and Agree with Plan of Care Patient             Patient will benefit from skilled therapeutic intervention in order to improve the following deficits and impairments:  Abnormal gait, Decreased endurance, Pain, Decreased strength, Difficulty walking, Decreased mobility, Decreased balance, Increased edema, Decreased range of motion  Visit Diagnosis: Stiffness of right knee, not elsewhere classified  Acute pain of right knee  Other abnormalities of gait and mobility  Localized edema  Muscle weakness (generalized)     Problem List Patient Active Problem List   Diagnosis Date Noted   Lupus nephritis, ISN/RPS class V (HCC) 08/10/2020   Systemic lupus erythematosus (SLE) in adult (HCC) 11/12/2019   Other proteinuria 11/12/2019   Vitamin D deficiency 11/12/2019   History of asthma 11/12/2019   Seasonal allergies 11/12/2019   Former smoker 11/12/2019    Chyrel Masson, PT, DPT, OCS, ATC 05/13/21  8:44 AM   [facility] Physical Therapy [address] [place], [place], [address] Phone: [phone]   Fax:  [phone]  Name: CANDIE GINTZ MRN: [id number] Date of Birth: Jun 01, 1985

## 2021-05-18 ENCOUNTER — Ambulatory Visit (INDEPENDENT_AMBULATORY_CARE_PROVIDER_SITE_OTHER): Payer: PRIVATE HEALTH INSURANCE | Admitting: Rehabilitative and Restorative Service Providers"

## 2021-05-18 ENCOUNTER — Encounter: Payer: PRIVATE HEALTH INSURANCE | Admitting: Rehabilitative and Restorative Service Providers"

## 2021-05-18 ENCOUNTER — Encounter: Payer: Self-pay | Admitting: Rehabilitative and Restorative Service Providers"

## 2021-05-18 ENCOUNTER — Other Ambulatory Visit: Payer: Self-pay

## 2021-05-18 DIAGNOSIS — M25561 Pain in right knee: Secondary | ICD-10-CM

## 2021-05-18 DIAGNOSIS — M25661 Stiffness of right knee, not elsewhere classified: Secondary | ICD-10-CM

## 2021-05-18 DIAGNOSIS — R600 Localized edema: Secondary | ICD-10-CM | POA: Diagnosis not present

## 2021-05-18 DIAGNOSIS — R2689 Other abnormalities of gait and mobility: Secondary | ICD-10-CM | POA: Diagnosis not present

## 2021-05-18 DIAGNOSIS — M6281 Muscle weakness (generalized): Secondary | ICD-10-CM

## 2021-05-18 NOTE — Therapy (Signed)
[facility] Physical Therapy [address] [place], [place], [address] Phone: [phone]   Fax:  [phone]  Physical Therapy Treatment  Patient Details  Name: Kristin Murphy MRN: [id number] Date of Birth: 02/02/1986 Referring Provider (PT): Donella Stade, [place]   Encounter Date: 05/18/2021   PT End of Session - 05/18/21 0818     Visit Number 21    Number of Visits 25    Date for PT Re-Evaluation 05/30/21    PT Start Time 0813    PT Stop Time 0852    PT Time Calculation (min) 39 min    Activity Tolerance Patient limited by pain    Behavior During Therapy [facility] for tasks assessed/performed             Past Medical History:  Diagnosis Date   Systemic lupus erythematosus ([place])     Past Surgical History:  Procedure Laterality Date   KNEE ARTHROSCOPY Right 2022   RENAL BIOPSY     WRIST SURGERY     biopsy    There were no vitals filed for this visit.   Subjective Assessment - 05/18/21 0816     Subjective Pt. indicated having a lupus flare up today with complaints all over body.  Pt. indicated Rt knee was around 5-6/10 today.  General stiffness noted throughout body.    Limitations Standing;Walking    Patient Stated Goals improve mobility. strength, return to work    Currently in Pain? Yes    Pain Score 6     Pain Location Knee    Pain Orientation Right    Pain Descriptors / Indicators Aching;Sore    Pain Type Surgical pain    Pain Onset More than a month ago    Pain Frequency Intermittent    Aggravating Factors  lupus flare up, standing    Pain Relieving Factors moving around a little to help stiffness                               [facility] Adult PT Treatment/Exercise - 05/18/21 0001       Neuro Re-ed    Neuro Re-ed Details  tandem stance on foam c head turns/flexion and extension movements throughout 1 min x 2 bilateral      Knee/Hip Exercises: Aerobic   Nustep Lvl 6 10.5 mins UE/LE      Knee/Hip Exercises:  Machines for Strengthening   Cybex Knee Extension Double leg up, single leg down Rt leg 3 x 10  5 lbs                       PT Short Term Goals - 03/24/21 0930       PT SHORT TERM GOAL #1   Title Independent with initial HEP    Time 4    Period Weeks    Status Achieved    Target Date 03/23/21      PT SHORT TERM GOAL #2   Title Rt knee AROM improved 0-10-120 for improved function    Time 4    Period Weeks    Status Achieved    Target Date 03/23/21               PT Long Term Goals - 05/13/21 0839       PT LONG TERM GOAL #1   Title Independent with final HEP    Time 6    Period Weeks  Status On-going    Target Date 05/30/21      PT LONG TERM GOAL #2   Title Rt knee AROM improved 0-125 for improved function    Time 8    Period Weeks    Status Achieved      PT LONG TERM GOAL #3   Title Amb independently without significant deviations for improved function    Time 6    Period Weeks    Status Achieved    Target Date 05/30/21      PT LONG TERM GOAL #4   Title Report pain < 2/10 for improved function    Time 6    Period Weeks    Status On-going    Target Date 05/30/21      PT LONG TERM GOAL #5   Title FOTO score improved to 71 for improved function    Time 6    Period Weeks    Status On-going    Target Date 05/30/21                   Plan - 05/18/21 0818     Clinical Impression Statement Presentation today c generalized achy/soreness complaints that patient related to lupus flareup.  Focus on general mobility and activity to improve symptoms and still work out Emergency planning/management officer.  Adjustments made to difficulty level based off symptoms and will plan return as symptoms reduce.    Personal Factors and Comorbidities Comorbidity 1    Comorbidities lupus    Examination-Activity Limitations Bend;Squat;Stairs;Stand;Transfers;Lift;Locomotion Level    Examination-Participation Restrictions Community Activity;Shop;Driving;Occupation     Stability/Clinical Decision Making Evolving/Moderate complexity    Clinical Decision Making Moderate    Rehab Potential Good    PT Frequency 2x / week    PT Duration 6 weeks    PT Treatment/Interventions ADLs/Self Care Home Management;Cryotherapy;Electrical Stimulation;Moist Heat;Balance training;Therapeutic exercise;Therapeutic activities;Functional mobility training;Stair training;Gait training;DME Instruction;Neuromuscular re-education;Patient/family education;Manual techniques;Passive range of motion;Vasopneumatic Device;Taping;Dry needling    PT Next Visit Plan Return to progress as lupus symptoms reduce.    PT Home Exercise Plan Access Code: [email or web address]    Consulted and Agree with Plan of Care Patient             Patient will benefit from skilled therapeutic intervention in order to improve the following deficits and impairments:  Abnormal gait, Decreased endurance, Pain, Decreased strength, Difficulty walking, Decreased mobility, Decreased balance, Increased edema, Decreased range of motion  Visit Diagnosis: Stiffness of right knee, not elsewhere classified  Acute pain of right knee  Other abnormalities of gait and mobility  Localized edema  Muscle weakness (generalized)     Problem List Patient Active Problem List   Diagnosis Date Noted   Lupus nephritis, ISN/RPS class V (HCC) 08/10/2020   Systemic lupus erythematosus (SLE) in adult (HCC) 11/12/2019   Other proteinuria 11/12/2019   Vitamin D deficiency 11/12/2019   History of asthma 11/12/2019   Seasonal allergies 11/12/2019   Former smoker 11/12/2019    Chyrel Masson, PT, DPT, OCS, ATC 05/18/21  8:54 AM    [facility] Physical Therapy [address] [place], [place], [address] Phone: [phone]   Fax:  [phone]  Name: JASIRA ROBINSON MRN: [id number] Date of Birth: 1985-10-28

## 2021-05-20 ENCOUNTER — Other Ambulatory Visit: Payer: Self-pay | Admitting: Physician Assistant

## 2021-05-20 ENCOUNTER — Encounter: Payer: Self-pay | Admitting: Rehabilitative and Restorative Service Providers"

## 2021-05-20 ENCOUNTER — Ambulatory Visit (INDEPENDENT_AMBULATORY_CARE_PROVIDER_SITE_OTHER): Payer: PRIVATE HEALTH INSURANCE | Admitting: Rehabilitative and Restorative Service Providers"

## 2021-05-20 ENCOUNTER — Other Ambulatory Visit: Payer: Self-pay

## 2021-05-20 DIAGNOSIS — R600 Localized edema: Secondary | ICD-10-CM | POA: Diagnosis not present

## 2021-05-20 DIAGNOSIS — M6281 Muscle weakness (generalized): Secondary | ICD-10-CM

## 2021-05-20 DIAGNOSIS — R2689 Other abnormalities of gait and mobility: Secondary | ICD-10-CM | POA: Diagnosis not present

## 2021-05-20 DIAGNOSIS — M25561 Pain in right knee: Secondary | ICD-10-CM | POA: Diagnosis not present

## 2021-05-20 DIAGNOSIS — M25661 Stiffness of right knee, not elsewhere classified: Secondary | ICD-10-CM | POA: Diagnosis not present

## 2021-05-20 NOTE — Therapy (Signed)
[facility] Physical Therapy [address] [place], [place], [address] Phone: [phone]   Fax:  [phone]  Physical Therapy Treatment /Discharge   Patient Details  Name: Kristin Murphy MRN: [id number] Date of Birth: May 31, 1985 Referring Provider (PT): Magnant, Gerrianne Scale, [place]   Encounter Date: 05/20/2021  PHYSICAL THERAPY DISCHARGE SUMMARY  Visits from Start of Care: 22  Current functional level related to goals / functional outcomes: See note   Remaining deficits: See note   Education / Equipment: HEP   Patient agrees to discharge. Patient goals were met. Patient is being discharged due to meeting the stated rehab goals.    PT End of Session - 05/20/21 0802     Visit Number 22    Number of Visits 25    Date for PT Re-Evaluation 05/30/21    PT Start Time 0759    PT Stop Time 0838    PT Time Calculation (min) 39 min    Activity Tolerance Patient tolerated treatment well    Behavior During Therapy [facility] for tasks assessed/performed             Past Medical History:  Diagnosis Date   Systemic lupus erythematosus ([place])     Past Surgical History:  Procedure Laterality Date   KNEE ARTHROSCOPY Right 2022   RENAL BIOPSY     WRIST SURGERY     biopsy    There were no vitals filed for this visit.   Subjective Assessment - 05/20/21 0801     Subjective Pt. indicated feeling better since last visit.  Pt. rated overall improvement about 75-80% back to normal.  Comfortable with plan at gym.    Limitations Standing;Walking    Patient Stated Goals improve mobility. strength, return to work    Currently in Pain? No/denies    Pain Score 0-No pain    Pain Onset More than a month ago                [facility] PT Assessment - 05/20/21 0001       Assessment   Medical Diagnosis Z98.890 (ICD-10-CM) - Status post arthroscopy of right knee    Referring Provider (PT) Magnant, Gerrianne Scale, PA-C    Onset Date/Surgical Date 01/18/21    Hand  Dominance Right      Observation/Other Assessments   Focus on Therapeutic Outcomes (FOTO)  update 66%      AROM   Right Knee Extension 0    Right Knee Flexion 135      Strength   Right Knee Extension 5/5   40, 37.8 lbs   Left Knee Extension 5/5                           [facility] Adult PT Treatment/Exercise - 05/20/21 0001       Knee/Hip Exercises: Aerobic   Recumbent Bike Various Lvl 6-8 range 10 mins      Knee/Hip Exercises: Machines for Strengthening   Cybex Knee Extension Double leg up, single leg down Rt leg 3 x 10  10 lbs      Knee/Hip Exercises: Standing   Lateral Step Up Step Height: 6";2 sets;10 reps;Both   eccentric step down focus   Other Standing Knee Exercises squat on decline ramp 2 x 10 (above 90 degrees)    Other Standing Knee Exercises flight of stairs reciprocal gait demonstration up/down      Knee/Hip Exercises: Seated   Other Seated Knee/Hip Exercises seated slr Rt  2 x 10 (cues for use at home on daily basis)                       PT Short Term Goals - 03/24/21 0930       PT SHORT TERM GOAL #1   Title Independent with initial HEP    Time 4    Period Weeks    Status Achieved    Target Date 03/23/21      PT SHORT TERM GOAL #2   Title Rt knee AROM improved 0-10-120 for improved function    Time 4    Period Weeks    Status Achieved    Target Date 03/23/21               PT Long Term Goals - 05/20/21 0815       PT LONG TERM GOAL #1   Title Independent with final HEP    Time 6    Period Weeks    Status Achieved    Target Date 05/30/21      PT LONG TERM GOAL #2   Title Rt knee AROM improved 0-125 for improved function    Time 8    Period Weeks    Status Achieved      PT LONG TERM GOAL #3   Title Amb independently without significant deviations for improved function    Time 6    Period Weeks    Status Achieved    Target Date 05/30/21      PT LONG TERM GOAL #4   Title Report pain < 2/10 for improved  function    Time 6    Period Weeks    Status Achieved    Target Date 05/30/21      PT LONG TERM GOAL #5   Title FOTO score improved to 71 for improved function    Time 6    Period Weeks    Status Partially Met    Target Date 05/30/21                   Plan - 05/20/21 0815     Clinical Impression Statement Pt. has attended 21 visits overall during course of treatment, reporting good overall improvement. See objective data for updated information.  Good ROM reaching goal as well as dynamometry equal bilateral for knee extension.  At this point, Pt. has achieved established goals and shown confidence and ability to incorporate plan into gym based activity.  Recommend d/c to HEP at this time for continued gains.    Personal Factors and Comorbidities Comorbidity 1    Comorbidities lupus    Examination-Activity Limitations Bend;Squat;Stairs;Stand;Transfers;Lift;Locomotion Level    Examination-Participation Restrictions Community Activity;Shop;Driving;Occupation    Stability/Clinical Decision Making Evolving/Moderate complexity    Rehab Potential Good    PT Frequency 2x / week    PT Duration 6 weeks    PT Treatment/Interventions ADLs/Self Care Home Management;Cryotherapy;Electrical Stimulation;Moist Heat;Balance training;Therapeutic exercise;Therapeutic activities;Functional mobility training;Stair training;Gait training;DME Instruction;Neuromuscular re-education;Patient/family education;Manual techniques;Passive range of motion;Vasopneumatic Device;Taping;Dry needling    PT Next Visit Plan D/C to HEP    PT Home Exercise Plan Access Code: [facility]    Consulted and Agree with Plan of Care Patient             Patient will benefit from skilled therapeutic intervention in order to improve the following deficits and impairments:  Abnormal gait, Decreased endurance, Pain, Decreased strength, Difficulty walking, Decreased mobility, Decreased balance, Increased edema, Decreased range  of motion  Visit Diagnosis: Stiffness of right knee, not elsewhere classified  Acute pain of right knee  Other abnormalities of gait and mobility  Localized edema  Muscle weakness (generalized)     Problem List Patient Active Problem List   Diagnosis Date Noted   Lupus nephritis, ISN/RPS class V ([place]) 08/10/2020   Systemic lupus erythematosus (SLE) in adult ([place]) 11/12/2019   Other proteinuria 11/12/2019   Vitamin D deficiency 11/12/2019   History of asthma 11/12/2019   Seasonal allergies 11/12/2019   Former smoker 11/12/2019    Scot Jun, PT, DPT, OCS, ATC 05/20/21  8:34 AM    [facility] Physical Therapy [address] [place], [place], [address] Phone: [phone]   Fax:  [phone]  Name: Kristin Murphy MRN: [id number] Date of Birth: September 12, 1985

## 2021-05-24 NOTE — Telephone Encounter (Signed)
Next Visit: 07/27/2021  Last Visit: 04/26/2021  Last Fill: 12/21/2020  DX:  Systemic lupus erythematosus (SLE) in adult   Current Dose per office note 04/26/2021: Imuran 150 mg p.o. daily  Labs: 03/03/2021 CMP is normal.  White cell count is low and stable  Okay to refill Imuran?

## 2021-06-30 IMAGING — MR MR KNEE*R* W/O CM
4 of 6 series · 23 of 40 positions shown · non-contrast
Comparison: Plain films right knee 11/03/2020.

CLINICAL DATA: Right knee pain, swelling and tightness. History of
lupus and fever.

EXAM:
MRI OF THE RIGHT KNEE WITHOUT CONTRAST
TECHNIQUE: Multiplanar, multisequence MR imaging of the knee was performed. No
intravenous contrast was administered.

[Series 4: T1 · coronal · 4.0mm · 0.29mm/px · 3 of 29 slices shown]
[im 6/29]
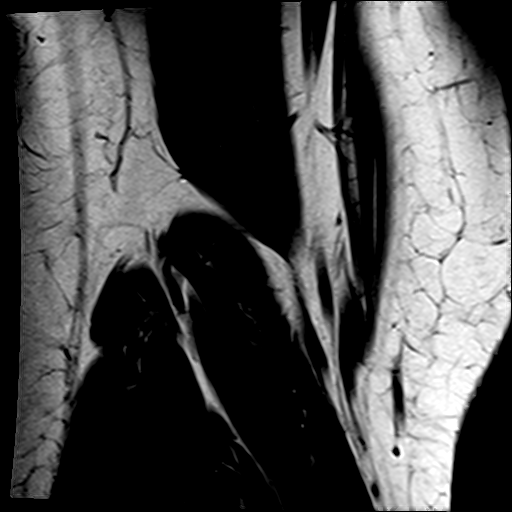
[im 17/29]
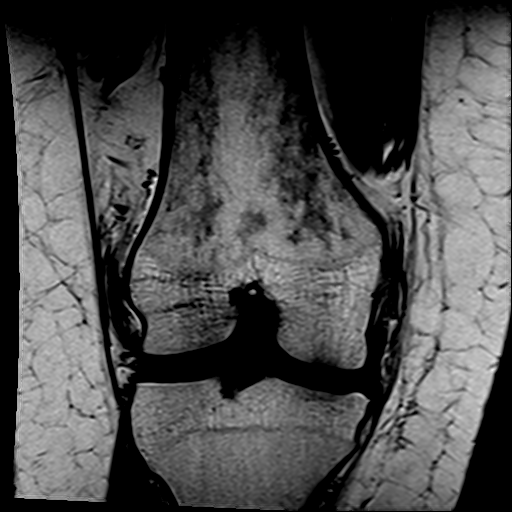
[im 29/29]
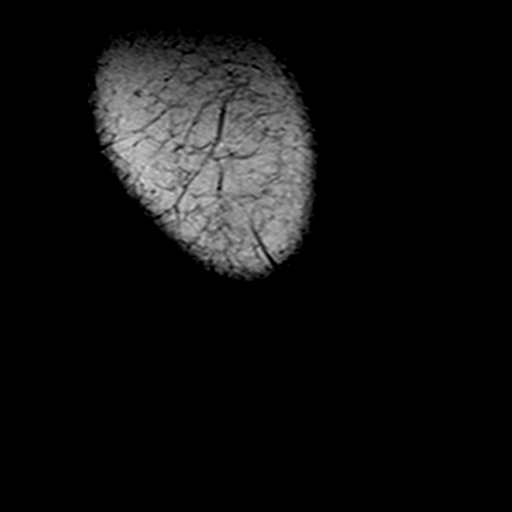

[Series 5: T2 fat-sat · coronal · 4.0mm · 0.59mm/px · 6 of 29 slices shown (1 of 2)]
[im 1/29]
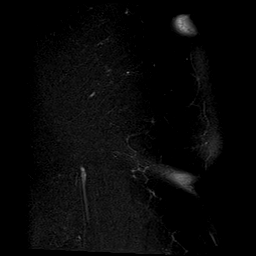
[im 6/29]
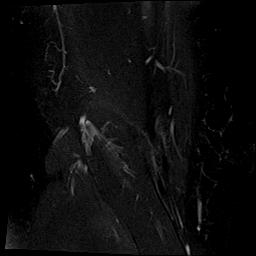
[im 12/29]
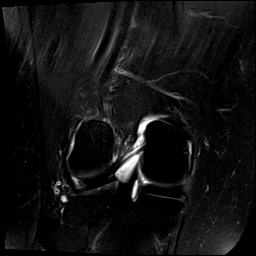
[im 17/29]
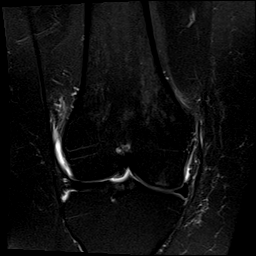
[im 23/29]
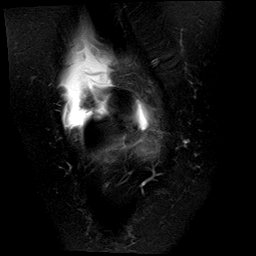
[im 29/29]
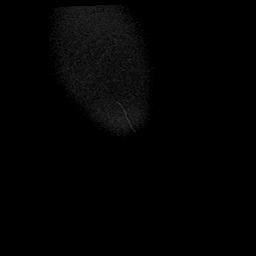

[Series 7: PD fat-sat · sagittal · 3.0mm · 0.29mm/px · 7 of 31 slices shown]
[im 1/31]
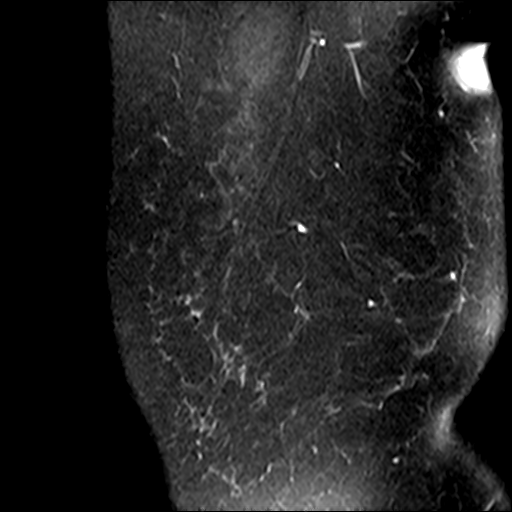
[im 6/31]
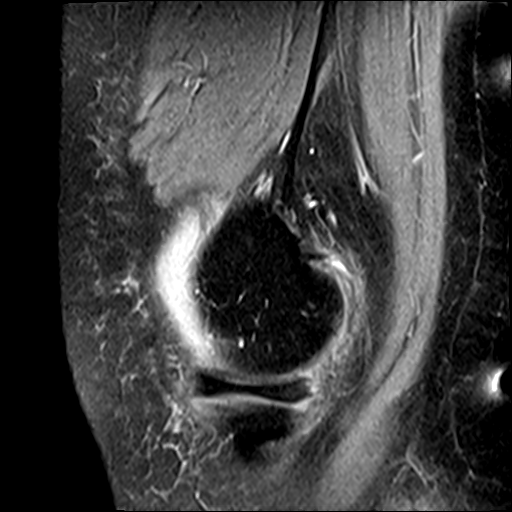
[im 11/31]
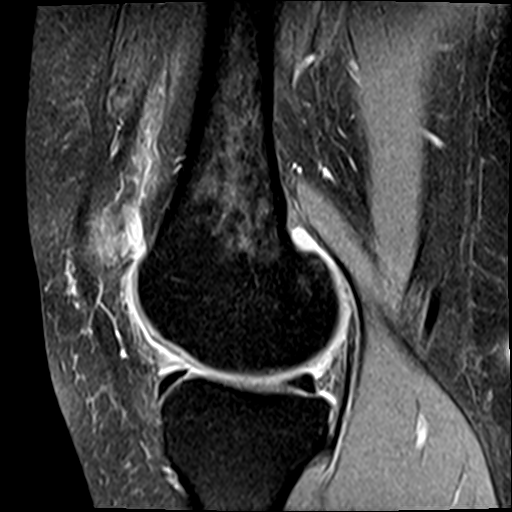
[im 16/31]
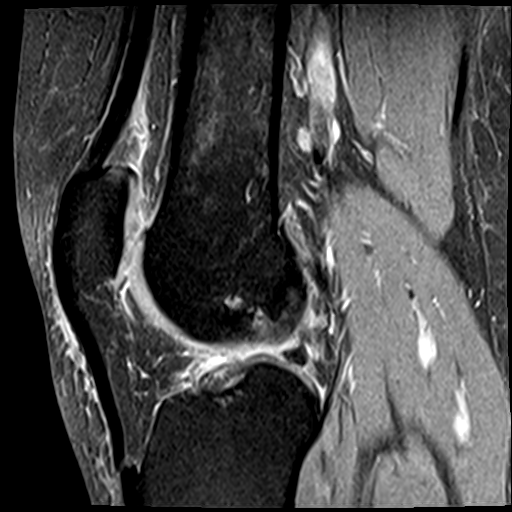
[im 21/31]
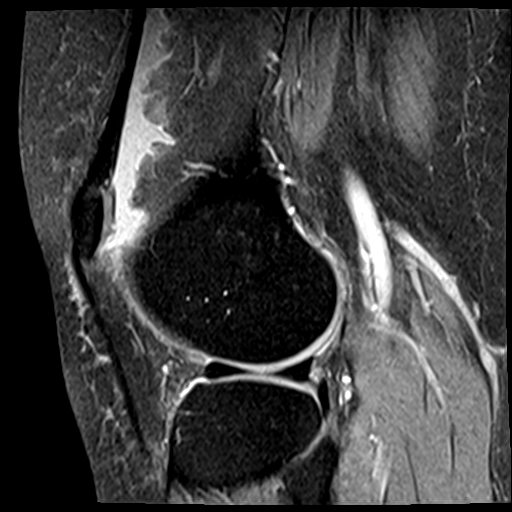
[im 26/31]
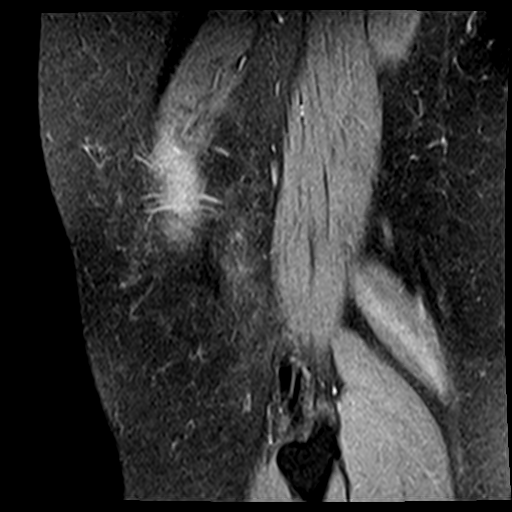
[im 31/31]
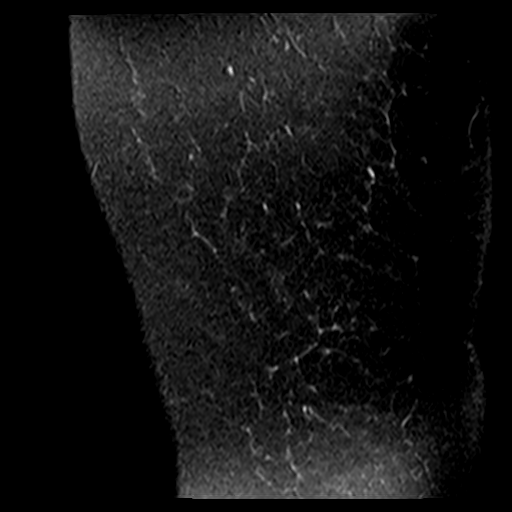

[Series 8: T2 fat-sat · sagittal · 3.0mm · 0.29mm/px · 7 of 31 slices shown (2 of 2)]
[im 1/31]
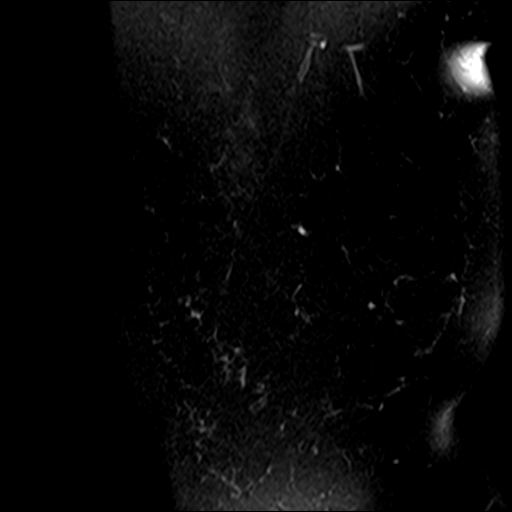
[im 6/31]
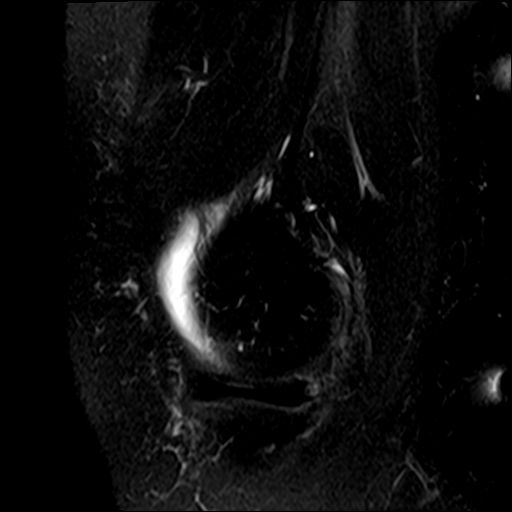
[im 11/31]
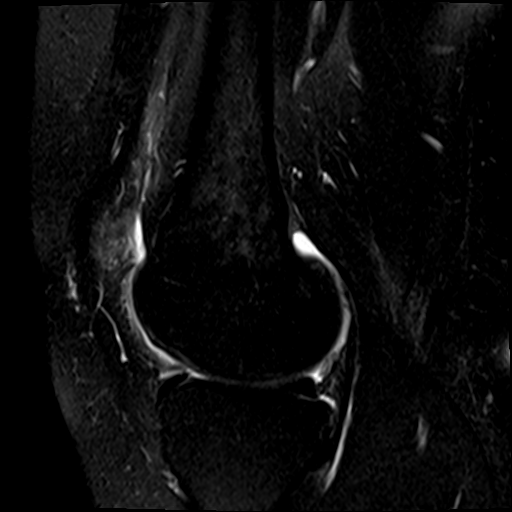
[im 16/31]
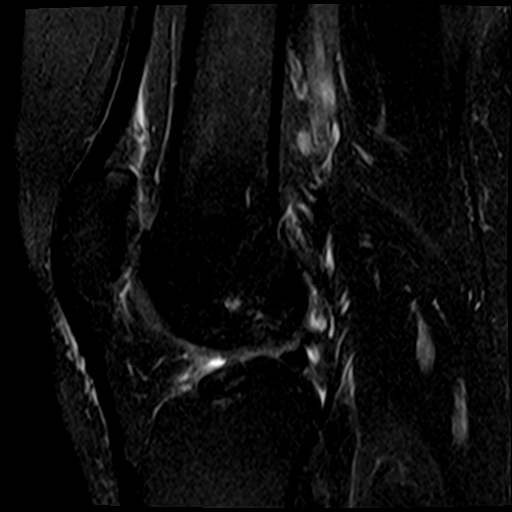
[im 21/31]
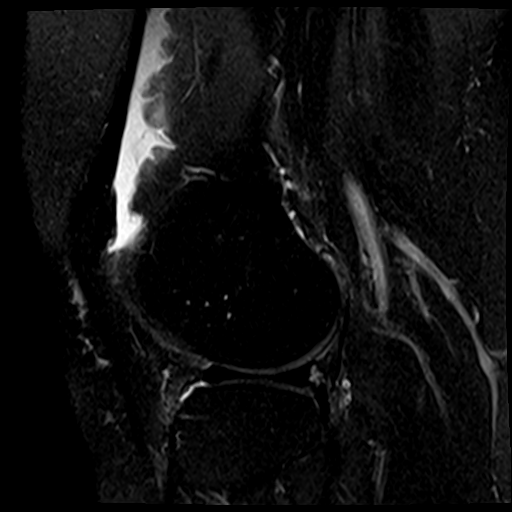
[im 26/31]
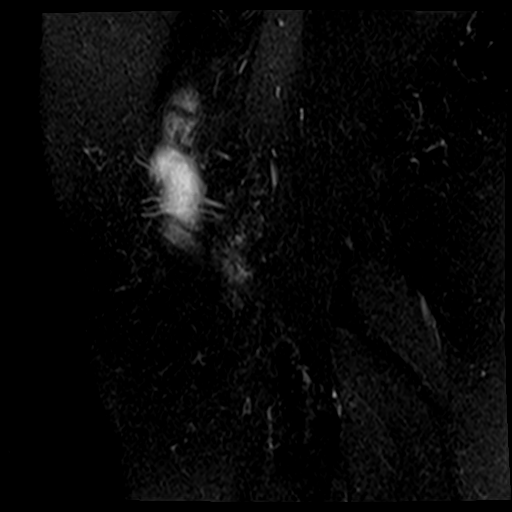
[im 31/31]
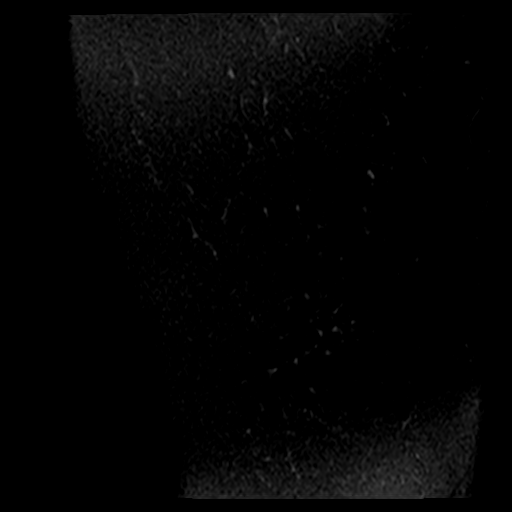

[23 of 40 positions shown; findings below may reference images not displayed]

FINDINGS: MENISCI

Medial meniscus: There is a tear of the capsular surface of the
meniscus in the posterior body. No tear reaching an articular
surface is identified.

Lateral meniscus:  Intact.

LIGAMENTS

Cruciates:  Intact.

Collaterals:  Intact.

CARTILAGE

Patellofemoral: Fraying and irregularity are worst on the inferior
pole of the patella.

Medial:  Frayed and irregular.

Lateral:  Frayed and irregular.

Joint:  Moderate joint effusion.

Popliteal Fossa:  No Baker's cyst.

Extensor Mechanism:  Intact.

Bones: There is a small focus of subchondral edema in the
weight-bearing medial femoral condyle.

Other: None
IMPRESSION: Tear of the capsular surface of the posterior body of the medial
meniscus. Negative for meniscal tear reaching an articular surface.

Mild osteoarthritis about the knee appears worst in the medial
compartment.

## 2021-07-07 ENCOUNTER — Encounter: Payer: Self-pay | Admitting: Rheumatology

## 2021-07-14 NOTE — Progress Notes (Signed)
? ?Office Visit Note ? ?Patient: Kristin Murphy             ?Date of Birth: 1986-04-03           ?MRN: [id number]             ?PCP: [facility] ?Referring: [facility] ?Visit Date: 07/27/2021 ?Occupation: @GUAROCC @ ? ?Subjective:  ?Medication management ? ?History of Present Illness: DEERICA MOEHLE is a 36 y.o. female history of systemic lupus erythematosus and osteoarthritis.  She denies any history of oral ulcers, nasal ulcers, malar rash, photosensitivity, Raynaud's phenomenon or lymphadenopathy.  She states she has been tolerating Plaquenil well.  She is a still recovering from the right knee joint surgery.  She had physical therapy which was helpful.  She notices hyperpigmentation on her face.  She would like to be referred to a dermatologist.  Patient states that she will be moving to [place] next year. ? ?Activities of Daily Living:  ?Patient reports morning stiffness for 0 minutes.   ?Patient Denies nocturnal pain.  ?Difficulty dressing/grooming: Denies ?Difficulty climbing stairs: Reports ?Difficulty getting out of chair: Denies ?Difficulty using hands for taps, buttons, cutlery, and/or writing: Denies ? ?Review of Systems  ?Constitutional:  Negative for fatigue.  ?HENT:  Negative for mouth sores, mouth dryness and nose dryness.   ?Eyes:  Positive for dryness. Negative for pain and itching.  ?Respiratory:  Negative for shortness of breath and difficulty breathing.   ?Cardiovascular:  Positive for chest pain. Negative for palpitations.  ?Gastrointestinal:  Negative for blood in stool, constipation and diarrhea.  ?Endocrine: Negative for increased urination.  ?Genitourinary:  Negative for difficulty urinating.  ?Musculoskeletal:  Positive for joint pain and joint pain. Negative for joint swelling, myalgias, morning stiffness, muscle tenderness and myalgias.  ?Skin:  Negative for color change, rash and redness.  ?Allergic/Immunologic: Negative for susceptible to  infections.  ?Neurological:  Positive for numbness and headaches. Negative for dizziness, memory loss and weakness.  ?Hematological:  Positive for bruising/bleeding tendency.  ?Psychiatric/Behavioral:  Negative for confusion.   ? ?PMFS History:  ?Patient Active Problem List  ? Diagnosis Date Noted  ? Lupus nephritis, ISN/RPS class V ([place]) 08/10/2020  ? Systemic lupus erythematosus (SLE) in adult [facility]) 11/12/2019  ? Other proteinuria 11/12/2019  ? Vitamin D deficiency 11/12/2019  ? History of asthma 11/12/2019  ? Seasonal allergies 11/12/2019  ? Former smoker 11/12/2019  ?  ?Past Medical History:  ?Diagnosis Date  ? Systemic lupus erythematosus ([place])   ?  ?Family History  ?Problem Relation Age of Onset  ? Hypertension Mother   ? Cancer Mother   ? Arthritis Father   ? Hypertension Other   ? Diabetes Other   ? Arthritis Other   ? Sarcoidosis Paternal Grandmother   ? Sarcoidosis Other   ? ?Past Surgical History:  ?Procedure Laterality Date  ? KNEE ARTHROSCOPY Right 2022  ? RENAL BIOPSY    ? WRIST SURGERY    ? biopsy  ? ?Social History  ? ?Social History Narrative  ? Not on file  ? ?Immunization History  ?Administered Date(s) Administered  ? PFIZER(Purple Top)SARS-COV-2 Vaccination 12/08/2019, 12/29/2019  ?  ? ?Objective: ?Vital Signs: BP 119/78 (BP Location: Left Arm, Patient Position: Sitting, Cuff Size: Large)   Pulse 64   Ht 5\' 6"  (1.676 m)   Wt 236 lb (107 kg)   BMI 38.09 kg/m?   ? ?Physical Exam ?Vitals and nursing note reviewed.  ?Constitutional:   ?  Appearance: She is well-developed.  ?HENT:  ?   Head: Normocephalic and atraumatic.  ?Eyes:  ?   Conjunctiva/sclera: Conjunctivae normal.  ?Cardiovascular:  ?   Rate and Rhythm: Normal rate and regular rhythm.  ?   Heart sounds: Normal heart sounds.  ?Pulmonary:  ?   Effort: Pulmonary effort is normal.  ?   Breath sounds: Normal breath sounds.  ?Abdominal:  ?   General: Bowel sounds are normal.  ?   Palpations: Abdomen is soft.  ?Musculoskeletal:  ?   Cervical  back: Normal range of motion.  ?Lymphadenopathy:  ?   Cervical: No cervical adenopathy.  ?Skin: ?   General: Skin is warm and dry.  ?   Capillary Refill: Capillary refill takes less than 2 seconds.  ?Neurological:  ?   Mental Status: She is alert and oriented to person, place, and time.  ?Psychiatric:     ?   Behavior: Behavior normal.  ?  ? ?Musculoskeletal Exam: C-spine was in good range of motion.  Shoulder joints, elbow joints, wrist joints, MCPs PIPs and DIPs with good range of motion with no synovitis.  Hip joints, knee joints, ankles, MTPs and PIPs with good range of motion with no synovitis. ? ?CDAI Exam: ?CDAI Score: -- ?Patient Global: --; Provider Global: -- ?Swollen: --; Tender: -- ?Joint Exam 07/27/2021  ? ?No joint exam has been documented for this visit  ? ?There is currently no information documented on the homunculus. Go to the Rheumatology activity and complete the homunculus joint exam. ? ?Investigation: ?No additional findings. ? ?Imaging: ?No results found. ? ?Recent Labs: ?Lab Results  ?Component Value Date  ? WBC 2.7 (L) 07/25/2021  ? HGB 12.7 07/25/2021  ? PLT 191 07/25/2021  ? NA 139 07/25/2021  ? K 3.8 07/25/2021  ? CL 108 07/25/2021  ? CO2 24 07/25/2021  ? GLUCOSE 81 07/25/2021  ? BUN 8 07/25/2021  ? CREATININE 0.78 07/25/2021  ? BILITOT 0.4 07/25/2021  ? AST 14 07/25/2021  ? ALT 6 07/25/2021  ? PROT 7.0 07/25/2021  ? CALCIUM 9.0 07/25/2021  ? GFRAA 118 11/12/2020  ? QFTBGOLDPLUS NEGATIVE 09/02/2019  ? ? ?Speciality Comments: PLQ eye exam: 07/07/2021 normal. Alois Cliche, OD. Follow up in 1 year. ? ?Procedures:  ?No procedures performed ?Allergies: Amoxicillin, Paba derivatives, Penicillins, and Sulfa antibiotics  ? ?Assessment / Plan:     ?Visit Diagnoses: Systemic lupus erythematosus (SLE) in adult ([place]) - Positive ANA, positive Ro, positive Sm, positive RNP, proteinuria, inflammatory arthritis, lupus nephritis.  She is clinically doing well.  She denies oral ulcers, nasal ulcers, malar  rash, photosensitivity, Raynaud's phenomenon or lymphadenopathy.  She denies any history of inflammatory arthritis.  She states she is concerned about the hyperpigmentation on her face and wants to see a dermatologist. ? 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  Followed by Dr. Joelyn Oms at [facility].  Recent labs showed trace protein in the urine.  Labs were forwarded to Dr. Joelyn Oms. ? ?High risk medication use - Plaquenil 200 mg twice daily, Imuran 150 mg p.o. daily. PLQ eye exam: 07/07/2021  ? ?Other neutropenia (HCC)-White cell count is low and stable.  Lab findings were discussed with the patient. ? ?Other proteinuria - followed by nephrology. ? ?Chronic pain of right knee - This post meniscal tear repair by Dr. Marlou Sa in August 2022.  She is gradually recovering.  She went through physical therapy. ? ?  Hyperpigmentation of skin-she has noticed hyperpigmentation on her face.  She will be referred to dermatologist per her request. ? ?Palmar wart - according to the patient the biopsy was positive for HPV.   ? ?Vitamin D deficiency-she has history of vitamin D deficiency.  We will check vitamin D level with the next labs. ? ?Seasonal allergies ? ?History of asthma ? ?Former smoker ? ?Orders: ?Orders Placed This Encounter  ?Procedures  ? Protein / creatinine ratio, urine  ? CBC with Differential/Platelet  ? COMPLETE METABOLIC PANEL WITH GFR  ? Anti-DNA antibody, double-stranded  ? C3 and C4  ? Sedimentation rate  ? VITAMIN D 25 Hydroxy (Vit-D Deficiency, Fractures)  ? Ambulatory referral to Dermatology  ? ?No orders of the defined types were placed in this encounter. ? ? ? ?Follow-Up Instructions: Return in about 3 months (around 10/27/2021) for Systemic lupus. ? ? ?Bo Merino, MD ? ?Note - This record has been created using [employer].  ?Chart creation errors have been sought, but may not always  ?have  been located. Such creation errors do not reflect on  ?the standard of medical care.  ?

## 2021-07-25 ENCOUNTER — Other Ambulatory Visit: Payer: Self-pay | Admitting: *Deleted

## 2021-07-25 ENCOUNTER — Telehealth: Payer: Self-pay

## 2021-07-25 DIAGNOSIS — Z79899 Other long term (current) drug therapy: Secondary | ICD-10-CM

## 2021-07-25 DIAGNOSIS — M329 Systemic lupus erythematosus, unspecified: Secondary | ICD-10-CM

## 2021-07-25 NOTE — Telephone Encounter (Signed)
Patient is at the office for [facility] for her upcoming appointment with Dr. Estanislado Pandy on Wednesday, 07/27/21.  Patient requested a copy of the labwork results be sent to Dr. Pearson Grippe at [facility].

## 2021-07-26 LAB — COMPLETE METABOLIC PANEL WITH GFR
AG Ratio: 1.1 (calc) (ref 1.0–2.5)
ALT: 6 U/L (ref 6–29)
AST: 14 U/L (ref 10–30)
Albumin: 3.6 g/dL (ref 3.6–5.1)
Alkaline phosphatase (APISO): 54 U/L (ref 31–125)
BUN: 8 mg/dL (ref 7–25)
CO2: 24 mmol/L (ref 20–32)
Calcium: 9 mg/dL (ref 8.6–10.2)
Chloride: 108 mmol/L (ref 98–110)
Creat: 0.78 mg/dL (ref 0.50–0.97)
Globulin: 3.4 g/dL (calc) (ref 1.9–3.7)
Glucose, Bld: 81 mg/dL (ref 65–99)
Potassium: 3.8 mmol/L (ref 3.5–5.3)
Sodium: 139 mmol/L (ref 135–146)
Total Bilirubin: 0.4 mg/dL (ref 0.2–1.2)
Total Protein: 7 g/dL (ref 6.1–8.1)
eGFR: 101 mL/min/{1.73_m2} (ref >=60)

## 2021-07-26 LAB — URINALYSIS, ROUTINE W REFLEX MICROSCOPIC
Bacteria, UA: NONE SEEN /HPF
Bilirubin Urine: NEGATIVE
Glucose, UA: NEGATIVE
Hgb urine dipstick: NEGATIVE
Hyaline Cast: NONE SEEN /LPF
Ketones, ur: NEGATIVE
Leukocytes,Ua: NEGATIVE
Nitrite: NEGATIVE
RBC / HPF: NONE SEEN /HPF (ref 0–2)
Specific Gravity, Urine: 1.016 (ref 1.001–1.035)
WBC, UA: NONE SEEN /HPF (ref 0–5)
pH: 5.5 (ref 5.0–8.0)

## 2021-07-26 LAB — CBC WITH DIFFERENTIAL/PLATELET
Absolute Monocytes: 405 cells/uL (ref 200–950)
Basophils Absolute: 19 cells/uL (ref 0–200)
Basophils Relative: 0.7 %
Eosinophils Absolute: 140 cells/uL (ref 15–500)
Eosinophils Relative: 5.2 %
HCT: 38.2 % (ref 35.0–45.0)
Hemoglobin: 12.7 g/dL (ref 11.7–15.5)
Lymphs Abs: 1053 cells/uL (ref 850–3900)
MCH: 30.8 pg (ref 27.0–33.0)
MCHC: 33.2 g/dL (ref 32.0–36.0)
MCV: 92.5 fL (ref 80.0–100.0)
MPV: 12.3 fL (ref 7.5–12.5)
Monocytes Relative: 15 %
Neutro Abs: 1083 cells/uL — ABNORMAL LOW (ref 1500–7800)
Neutrophils Relative %: 40.1 %
Platelets: 191 10*3/uL (ref 140–400)
RBC: 4.13 10*6/uL (ref 3.80–5.10)
RDW: 13.2 % (ref 11.0–15.0)
Total Lymphocyte: 39 %
WBC: 2.7 10*3/uL — ABNORMAL LOW (ref 3.8–10.8)

## 2021-07-26 LAB — PROTEIN / CREATININE RATIO, URINE
Creatinine, Urine: 211 mg/dL (ref 20–275)
Protein/Creat Ratio: 66 mg/g creat (ref 24–184)
Protein/Creatinine Ratio: 0.066 mg/mg creat (ref 0.024–0.184)
Total Protein, Urine: 14 mg/dL (ref 5–24)

## 2021-07-26 LAB — SEDIMENTATION RATE: Sed Rate: 11 mm/h (ref 0–20)

## 2021-07-26 LAB — ANTI-DNA ANTIBODY, DOUBLE-STRANDED: ds DNA Ab: <1 IU/mL

## 2021-07-26 LAB — MICROSCOPIC MESSAGE

## 2021-07-26 LAB — C3 AND C4
C3 Complement: 82 mg/dL — ABNORMAL LOW (ref 83–193)
C4 Complement: 20 mg/dL (ref 15–57)

## 2021-07-26 NOTE — Progress Notes (Signed)
UA shows trace protein which is not significant.  C3 is still.  White cell count is low and stable.  CMP is normal.  Sed rate is mildly elevated and improved.  Double-stranded DNA is negative.

## 2021-07-26 NOTE — Telephone Encounter (Signed)
Lab results faxed as requested.

## 2021-07-27 ENCOUNTER — Other Ambulatory Visit: Payer: Self-pay

## 2021-07-27 ENCOUNTER — Encounter: Payer: Self-pay | Admitting: Rheumatology

## 2021-07-27 ENCOUNTER — Ambulatory Visit (INDEPENDENT_AMBULATORY_CARE_PROVIDER_SITE_OTHER): Payer: PRIVATE HEALTH INSURANCE | Admitting: Rheumatology

## 2021-07-27 VITALS — BP 119/78 | HR 64 | Ht 66.0 in | Wt 236.0 lb

## 2021-07-27 DIAGNOSIS — R808 Other proteinuria: Secondary | ICD-10-CM

## 2021-07-27 DIAGNOSIS — L819 Disorder of pigmentation, unspecified: Secondary | ICD-10-CM

## 2021-07-27 DIAGNOSIS — B078 Other viral warts: Secondary | ICD-10-CM

## 2021-07-27 DIAGNOSIS — J302 Other seasonal allergic rhinitis: Secondary | ICD-10-CM

## 2021-07-27 DIAGNOSIS — M3214 Glomerular disease in systemic lupus erythematosus: Secondary | ICD-10-CM | POA: Diagnosis not present

## 2021-07-27 DIAGNOSIS — M25561 Pain in right knee: Secondary | ICD-10-CM

## 2021-07-27 DIAGNOSIS — E559 Vitamin D deficiency, unspecified: Secondary | ICD-10-CM

## 2021-07-27 DIAGNOSIS — M329 Systemic lupus erythematosus, unspecified: Secondary | ICD-10-CM

## 2021-07-27 DIAGNOSIS — Z79899 Other long term (current) drug therapy: Secondary | ICD-10-CM | POA: Diagnosis not present

## 2021-07-27 DIAGNOSIS — Z8709 Personal history of other diseases of the respiratory system: Secondary | ICD-10-CM

## 2021-07-27 DIAGNOSIS — D708 Other neutropenia: Secondary | ICD-10-CM

## 2021-07-27 DIAGNOSIS — Z87891 Personal history of nicotine dependence: Secondary | ICD-10-CM

## 2021-07-27 DIAGNOSIS — G8929 Other chronic pain: Secondary | ICD-10-CM

## 2021-09-11 ENCOUNTER — Encounter (HOSPITAL_COMMUNITY): Payer: Self-pay

## 2021-09-11 ENCOUNTER — Emergency Department (HOSPITAL_COMMUNITY)
Admission: EM | Admit: 2021-09-11 | Discharge: 2021-09-11 | Disposition: A | Discharge status: Home / Self Care | Payer: No Typology Code available for payment source | Attending: Emergency Medicine | Admitting: Emergency Medicine

## 2021-09-11 DIAGNOSIS — B9689 Other specified bacterial agents as the cause of diseases classified elsewhere: Secondary | ICD-10-CM

## 2021-09-11 DIAGNOSIS — N760 Acute vaginitis: Secondary | ICD-10-CM | POA: Insufficient documentation

## 2021-09-11 DIAGNOSIS — R102 Pelvic and perineal pain: Secondary | ICD-10-CM | POA: Diagnosis present

## 2021-09-11 LAB — URINALYSIS, ROUTINE W REFLEX MICROSCOPIC
Bilirubin Urine: NEGATIVE
Glucose, UA: NEGATIVE mg/dL
Hgb urine dipstick: NEGATIVE
Ketones, ur: NEGATIVE mg/dL
Leukocytes,Ua: NEGATIVE
Nitrite: NEGATIVE
Protein, ur: NEGATIVE mg/dL
Specific Gravity, Urine: 1.021 (ref 1.005–1.030)
pH: 5 (ref 5.0–8.0)

## 2021-09-11 LAB — WET PREP, GENITAL
Sperm: NONE SEEN
Trich, Wet Prep: NONE SEEN
WBC, Wet Prep HPF POC: <10 (ref <10)
Yeast Wet Prep HPF POC: NONE SEEN

## 2021-09-11 LAB — I-STAT BETA HCG BLOOD, ED (MC, WL, AP ONLY): I-stat hCG, quantitative: <5 m[IU]/mL (ref <5)

## 2021-09-11 MED ORDER — METRONIDAZOLE 500 MG PO TABS: 500.0000 mg | ORAL_TABLET | Freq: Two times a day (BID) | ORAL | 0 refills | Status: DC

## 2021-09-11 NOTE — ED Provider Triage Note (Addendum)
Emergency Medicine Provider Triage Evaluation Note ? ?Kristin Murphy , a 36 y.o. female  was evaluated in triage.  Pt complains of pelvic pain/lower abdominal pain, spotting since her period ended this past Thursday, abnormal odor persisting past the end of menses which is abnormal for her.  She also states that she does not trust her partner and is concerned about STI.  She also mentions diarrhea that has been occurring intermittently for the past couple weeks. ? ?Review of Systems  ?Positive: Pelvic pain/lower abdominal pain, vaginal discharge, vaginal spotting ?Negative: Fever, vomiting, known pregnancy, known sick exposure.  ? ?Physical Exam  ?BP 132/86 (BP Location: Left Arm)   Pulse 77   Temp 98.9 ?F (37.2 ?C)   Resp 20   SpO2 99%  ?Gen:   Awake, no distress   ?Resp:  Normal effort  ?MSK:   Moves extremities without difficulty  ?Other:  Pain in lower abdomen diffusely ? ?Medical Decision Making  ?Medically screening exam initiated at 10:08 AM.  Appropriate orders placed.  Kristin Murphy was informed that the remainder of the evaluation will be completed by another provider, this initial triage assessment does not replace that evaluation, and the importance of remaining in the ED until their evaluation is complete. ? ?  ?Peter Garter, [place] ?09/11/21 1013 ? ?  ?Peter Garter, [place] ?09/11/21 1014 ? ?

## 2021-09-11 NOTE — ED Provider Notes (Signed)
?[facility]-EMERGENCY DEPT ?Provider Note ? ? ?CSN: [id number] ?Arrival date & time: 09/11/21  2330 ? ?  ? ?History ? ?Chief Complaint  ?Patient presents with  ? Abdominal Pain  ? ? ?Kristin Murphy is a 36 y.o. female. ? ?36 year old female presents with concern for possible STI exposure.  Patient states that she has had some vaginal discharge with a smell.  Has had some lower pelvic pain but denies any fever.  No dysuria or hematuria.  No treatment use prior to arrival ? ? ?  ? ?Home Medications ?Prior to Admission medications   ?Medication Sig Start Date End Date Taking? Authorizing Provider  ?Acetaminophen (TYLENOL PO) Take by mouth as needed.    [provider]  ?azaTHIOprine (IMURAN) 50 MG tablet TAKE THREE TABLETS BY MOUTH DAILY 05/24/21   Gearldine Bienenstock, PA-C  ?cetirizine (ZYRTEC) 10 MG tablet Take 10 mg by mouth daily.    [provider]  ?hydroxychloroquine (PLAQUENIL) 200 MG tablet TAKE ONE TABLET BY MOUTH TWICE A DAY 04/19/21   Gearldine Bienenstock, PA-C  ?methocarbamol (ROBAXIN) 500 MG tablet Take 1 tablet (500 mg total) by mouth 4 (four) times daily. ?Patient not taking: Reported on 02/23/2021 01/14/21   Cammy Copa, MD  ?oxycodone (OXY-IR) 5 MG capsule Take 1 capsule (5 mg total) by mouth every 4 (four) hours as needed. ?Patient not taking: Reported on 02/23/2021 01/14/21   Cammy Copa, MD  ?   ? ?Allergies    ?Amoxicillin, Paba derivatives, Penicillins, and Sulfa antibiotics   ? ?Review of Systems   ?Review of Systems  ?All other systems reviewed and are negative. ? ?Physical Exam ?Updated Vital Signs ?BP (!) 145/87 (BP Location: Left Arm)   Pulse 69   Temp 98.9 ?F (37.2 ?C)   Resp 18   LMP  (LMP Unknown)   SpO2 100%  ?Physical Exam ?Vitals and nursing note reviewed. Exam conducted with a chaperone present.  ?Constitutional:   ?   General: She is not in acute distress. ?   Appearance: Normal appearance. She is well-developed. She is not  toxic-appearing.  ?HENT:  ?   Head: Normocephalic and atraumatic.  ?Eyes:  ?   General: Lids are normal.  ?   Conjunctiva/sclera: Conjunctivae normal.  ?   Pupils: Pupils are equal, round, and reactive to light.  ?Neck:  ?   Thyroid: No thyroid mass.  ?   Trachea: No tracheal deviation.  ?Cardiovascular:  ?   Rate and Rhythm: Normal rate and regular rhythm.  ?   Heart sounds: Normal heart sounds. No murmur heard. ?  No gallop.  ?Pulmonary:  ?   Effort: Pulmonary effort is normal. No respiratory distress.  ?   Breath sounds: Normal breath sounds. No stridor. No decreased breath sounds, wheezing, rhonchi or rales.  ?Abdominal:  ?   General: There is no distension.  ?   Palpations: Abdomen is soft.  ?   Tenderness: There is no abdominal tenderness. There is no rebound.  ?Genitourinary: ?   General: Normal vulva.  ?   Vagina: Normal.  ?Musculoskeletal:     ?   General: No tenderness. Normal range of motion.  ?   Cervical back: Normal range of motion and neck supple.  ?Skin: ?   General: Skin is warm and dry.  ?   Findings: No abrasion or rash.  ?Neurological:  ?   Mental Status: She is alert and oriented to person, place, and time.  Mental status is at baseline.  ?   GCS: GCS eye subscore is 4. GCS verbal subscore is 5. GCS motor subscore is 6.  ?   Cranial Nerves: No cranial nerve deficit.  ?   Sensory: No sensory deficit.  ?   Motor: Motor function is intact.  ?Psychiatric:     ?   Attention and Perception: Attention normal.     ?   Speech: Speech normal.     ?   Behavior: Behavior normal.  ? ? ?ED Results / Procedures / Treatments   ?Labs ?(all labs ordered are listed, but only abnormal results are displayed) ?Labs Reviewed  ?WET PREP, GENITAL  ?URINALYSIS, ROUTINE W REFLEX MICROSCOPIC  ?I-STAT BETA HCG BLOOD, ED ([facility], [facility], [facility] ONLY)  ?GC/CHLAMYDIA PROBE AMP ([place]) NOT [facility]  ? ? ?EKG ?None ? ?Radiology ?No results found. ? ?Procedures ?Procedures  ? ? ?Medications Ordered in ED ?Medications - No data to  display ? ?ED Course/ Medical Decision Making/ A&P ?  ?                        ?Medical Decision Making ? ?Patient has evidence of bacterial vaginosis based on results of her wet prep.  Will place on Flagyl and discharge ? ? ? ? ? ? ? ?Final Clinical Impression(s) / ED Diagnoses ?Final diagnoses:  ?None  ? ? ?Rx / DC Orders ?ED Discharge Orders   ? ? None  ? ?  ? ? ?  ?Lorre NickAllen, Dmari Schubring, MD ?09/11/21 1803 ? ?

## 2021-09-11 NOTE — ED Triage Notes (Signed)
Pt presents with c/o abdominal pain. Pt also reports some vaginal discharge but reports that she routinely takes vaginal vitamin suppositories. Pt wants to make sure that she does not have an STD.  ?

## 2021-09-13 LAB — GC/CHLAMYDIA PROBE AMP (~~LOC~~) NOT AT ARMC
Chlamydia: NEGATIVE
Comment: NEGATIVE
Comment: NORMAL
Neisseria Gonorrhea: NEGATIVE

## 2021-09-14 ENCOUNTER — Other Ambulatory Visit: Payer: Self-pay

## 2021-09-14 ENCOUNTER — Other Ambulatory Visit: Payer: Self-pay | Admitting: Physician Assistant

## 2021-09-14 DIAGNOSIS — M329 Systemic lupus erythematosus, unspecified: Secondary | ICD-10-CM

## 2021-09-14 MED ORDER — HYDROXYCHLOROQUINE SULFATE 200 MG PO TABS: 200.0000 mg | ORAL_TABLET | Freq: Two times a day (BID) | ORAL | 0 refills | Status: DC

## 2021-09-14 MED ORDER — AZATHIOPRINE 50 MG PO TABS: 150.0000 mg | ORAL_TABLET | Freq: Every day | ORAL | 0 refills | Status: DC

## 2021-09-14 NOTE — Telephone Encounter (Signed)
Next Visit: Due June 2023. Message sent to the front to schedule patient.  ? ?Last Visit: 07/27/2021 ? ?Labs: 07/25/2021 UA shows trace protein which is not significant.  C3 is still.  White cell count is low and stable.  CMP is normal.  Sed rate is mildly elevated and improved.  Double-stranded DNA is negative. ? ?Eye exam: 07/07/2021 normal  ? ?Current Dose per office note 07/27/2021: Plaquenil 200 mg twice daily, Imuran 150 mg p.o. daily ? ?[id number]:Systemic lupus erythematosus (SLE) in adult  ? ?Last Fill: 04/19/2021 (PLQ), 05/24/2021 (Imuran) ? ?Okay to refill Plaquenil and Imuran?  ?

## 2021-09-14 NOTE — Telephone Encounter (Signed)
Patient called requesting prescription refills of Hydroxychloroquine and Azathioprine to be sent to [facility] at [address].  Patient states she is out of Hydroxychloroquine, but still has a few tablets left of Azathioprine.   ?

## 2021-09-14 NOTE — Telephone Encounter (Signed)
Please schedule patient for a follow up visit. Patient due June 2023. Thanks! 

## 2021-10-21 NOTE — Progress Notes (Signed)
Office Visit Note  Patient: Kristin Murphy             Date of Birth: 1986-01-26           MRN: [id number]             PCP: Pcp, No Referring: No ref. provider found Visit Date: 11/04/2021 Occupation: @GUAROCC @  Subjective:  Medication management  History of Present Illness: Kristin Murphy is a 36 y.o. female with history of systemic lupus, lupus nephritis and osteoarthritis.  She states she has been tolerating hydroxychloroquine and Imuran without any side effects.  She denies any history of oral ulcers, nasal ulcers, malar rash, photosensitivity, Raynaud's phenomenon or lymphadenopathy.  She continues to have some pain and inflammation in her right knee joint since she had arthroscopic surgery.  She still has difficulty climbing stairs and walking especially in the evening.  She was treated in April for bacterial vaginosis with Flagyl.  She is asymptomatic now.  Activities of Daily Living:  Patient reports morning stiffness for 0  none .   Patient Denies nocturnal pain.  Difficulty dressing/grooming: Reports Difficulty climbing stairs: Reports Difficulty getting out of chair: Reports Difficulty using hands for taps, buttons, cutlery, and/or writing: Reports  Review of Systems  Constitutional:  Positive for fatigue.  HENT:  Positive for mouth dryness. Negative for mouth sores.   Eyes:  Positive for dryness.  Respiratory:  Negative for shortness of breath.   Cardiovascular:  Negative for swelling in legs/feet.  Gastrointestinal:  Positive for diarrhea.  Endocrine: Positive for increased urination.  Genitourinary:  Negative for difficulty urinating.  Musculoskeletal:  Positive for gait problem.  Skin:  Negative for color change, rash and sensitivity to sunlight.  Allergic/Immunologic: Positive for susceptible to infections.  Neurological:  Negative for numbness.  Hematological:  Positive for bruising/bleeding tendency. Negative for swollen glands.  Psychiatric/Behavioral:   Negative for depressed mood and sleep disturbance. The patient is not nervous/anxious.     PMFS History:  Patient Active Problem List   Diagnosis Date Noted   Lupus nephritis, ISN/RPS class V (HCC) 08/10/2020   Systemic lupus erythematosus (SLE) in adult (HCC) 11/12/2019   Other proteinuria 11/12/2019   Vitamin D deficiency 11/12/2019   History of asthma 11/12/2019   Seasonal allergies 11/12/2019   Former smoker 11/12/2019    Past Medical History:  Diagnosis Date   PMDD (premenstrual dysphoric disorder)    Systemic lupus erythematosus (HCC)     Family History  Problem Relation Age of Onset   Hypertension Mother    Cancer Mother    Arthritis Father    Hypertension Other    Diabetes Other    Arthritis Other    Sarcoidosis Paternal Grandmother    Sarcoidosis Other    Past Surgical History:  Procedure Laterality Date   KNEE ARTHROSCOPY Right 2022   RENAL BIOPSY     WRIST SURGERY     biopsy   Social History   Social History Narrative   Not on file   Immunization History  Administered Date(s) Administered   PFIZER(Purple Top)SARS-COV-2 Vaccination 12/08/2019, 12/29/2019     Objective: Vital Signs: BP 119/78 (BP Location: Left Arm, Patient Position: Sitting, Cuff Size: Normal)   Pulse 74   Resp 15   Ht 5\' 6"  (1.676 m)   Wt 240 lb (108.9 kg)   BMI 38.74 kg/m    Physical Exam Vitals and nursing note reviewed.  Constitutional:      Appearance: She is well-developed.  HENT:     Head: Normocephalic and atraumatic.  Eyes:     Conjunctiva/sclera: Conjunctivae normal.  Cardiovascular:     Rate and Rhythm: Normal rate and regular rhythm.     Heart sounds: Normal heart sounds.  Pulmonary:     Effort: Pulmonary effort is normal.     Breath sounds: Normal breath sounds.  Abdominal:     General: Bowel sounds are normal.     Palpations: Abdomen is soft.  Musculoskeletal:     Cervical back: Normal range of motion.  Lymphadenopathy:     Cervical: No cervical  adenopathy.  Skin:    General: Skin is warm and dry.     Capillary Refill: Capillary refill takes less than 2 seconds.     Comments: Hyperpigmented lesions were noted over plantar surface of bilateral feet.  Neurological:     Mental Status: She is alert and oriented to person, place, and time.  Psychiatric:        Behavior: Behavior normal.      Musculoskeletal Exam: C-spine thoracic and lumbar spine were in good range of motion.  Shoulder joints, elbow joints, wrist joints, MCPs PIPs and DIPs with good range of motion with no synovitis.  Hip joints, knee joints, ankles, MTPs and PIPs with good range of motion with no synovitis.  CDAI Exam: CDAI Score: -- Patient Global: --; Provider Global: -- Swollen: --; Tender: -- Joint Exam 11/04/2021   No joint exam has been documented for this visit   There is currently no information documented on the homunculus. Go to the Rheumatology activity and complete the homunculus joint exam.  Investigation: No additional findings.  Imaging: No results found.  Recent Labs: Lab Results  Component Value Date   WBC 2.7 (L) 07/25/2021   HGB 12.7 07/25/2021   PLT 191 07/25/2021   NA 139 07/25/2021   K 3.8 07/25/2021   CL 108 07/25/2021   CO2 24 07/25/2021   GLUCOSE 81 07/25/2021   BUN 8 07/25/2021   CREATININE 0.78 07/25/2021   BILITOT 0.4 07/25/2021   AST 14 07/25/2021   ALT 6 07/25/2021   PROT 7.0 07/25/2021   CALCIUM 9.0 07/25/2021   GFRAA 118 11/12/2020   QFTBGOLDPLUS NEGATIVE 09/02/2019    Speciality Comments: PLQ eye exam: 07/07/2021 normal. London Sheer, OD. Follow up in 1 year.  Procedures:  No procedures performed Allergies: Amoxicillin, Paba derivatives, Penicillins, and Sulfa antibiotics   Assessment / Plan:     Visit Diagnoses: Systemic lupus erythematosus (SLE) in adult (HCC) - Positive ANA, positive Ro, positive Sm, positive RNP, proteinuria, inflammatory arthritis, lupus nephritis -she has been doing well on  hydroxychloroquine 200 mg twice daily and Imuran 150 mg p.o. daily.  She denies any history of oral ulcers, nasal ulcers, malar rash, photosensitivity, Raynaud's phenomenon, lymphadenopathy or inflammatory arthritis.  She continues to have some discomfort in her right knee joint.  We will obtain labs today.  Plan: Protein / creatinine ratio, urine, Anti-DNA antibody, double-stranded, C3 and C4, Sedimentation rate  Lupus nephritis, ISN/RPS class V (HCC) - Renal biopsy Oct 21, 2019 consistent with membranous lupus nephritis (class V), proteinuria.  Followed by Dr. Marisue Humble at [facility].  Patient states she missed her last appointment with Dr. Marisue Humble due to financial reasons.  She will schedule a follow-up visit.  High risk medication use - Plaquenil 200 mg twice daily, Imuran 150 mg p.o. daily. PLQ eye exam: 07/07/2021  - Plan: CBC with Differential/Platelet, COMPLETE METABOLIC PANEL WITH GFR  today and then every 3 months.  Other neutropenia (HCC)-she has chronic neutropenia due to autoimmune disease.  Her white cell count was 2.7 in February 2023.  Contracture of joint of both elbows-resolved.  Chronic pain of right knee -  post meniscal tear repair by Dr. August Saucer in August 2022.  She continues to have warmth and swelling in her right knee joint.  She has been working out on a regular basis.  She plans to schedule an appointment with Dr. August Saucer.  Patient requested a parking placard, the form was filled and given to the patient.  Hyperpigmentation of skin-she has hyperpigmentation on the plantar surface of her feet and also few lesions on her face.  She states she was seen by dermatologist to told her the lesions was postinflammatory inflammation.  Stress-patient has been under a lot of stress.  She states she has an exam coming up and she is also planning to move to [facility].  Association of autoimmune disease flare with a stress was discussed.  Stress relieving techniques were  discussed.  Palmar wart  Vitamin D deficiency-use of vitamin D was advised.  Seasonal allergies  History of asthma  Former smoker  Orders: Orders Placed This Encounter  Procedures   Protein / creatinine ratio, urine   CBC with Differential/Platelet   COMPLETE METABOLIC PANEL WITH GFR   Anti-DNA antibody, double-stranded   C3 and C4   Sedimentation rate   No orders of the defined types were placed in this encounter.    Follow-Up Instructions: Return in about 3 months (around 02/04/2022) for SLE.   Pollyann Savoy, MD  Note - This record has been created using Animal nutritionist.  Chart creation errors have been sought, but may not always  have been located. Such creation errors do not reflect on  the standard of medical care.

## 2021-11-04 ENCOUNTER — Ambulatory Visit (INDEPENDENT_AMBULATORY_CARE_PROVIDER_SITE_OTHER): Payer: No Typology Code available for payment source | Admitting: Rheumatology

## 2021-11-04 ENCOUNTER — Encounter: Payer: Self-pay | Admitting: Rheumatology

## 2021-11-04 VITALS — BP 119/78 | HR 74 | Resp 15 | Ht 66.0 in | Wt 240.0 lb

## 2021-11-04 DIAGNOSIS — M3214 Glomerular disease in systemic lupus erythematosus: Secondary | ICD-10-CM

## 2021-11-04 DIAGNOSIS — Z79899 Other long term (current) drug therapy: Secondary | ICD-10-CM

## 2021-11-04 DIAGNOSIS — Z8709 Personal history of other diseases of the respiratory system: Secondary | ICD-10-CM

## 2021-11-04 DIAGNOSIS — J302 Other seasonal allergic rhinitis: Secondary | ICD-10-CM

## 2021-11-04 DIAGNOSIS — M329 Systemic lupus erythematosus, unspecified: Secondary | ICD-10-CM | POA: Diagnosis not present

## 2021-11-04 DIAGNOSIS — D708 Other neutropenia: Secondary | ICD-10-CM

## 2021-11-04 DIAGNOSIS — B078 Other viral warts: Secondary | ICD-10-CM

## 2021-11-04 DIAGNOSIS — Z87891 Personal history of nicotine dependence: Secondary | ICD-10-CM

## 2021-11-04 DIAGNOSIS — L819 Disorder of pigmentation, unspecified: Secondary | ICD-10-CM

## 2021-11-04 DIAGNOSIS — M24521 Contracture, right elbow: Secondary | ICD-10-CM

## 2021-11-04 DIAGNOSIS — G8929 Other chronic pain: Secondary | ICD-10-CM

## 2021-11-04 DIAGNOSIS — F439 Reaction to severe stress, unspecified: Secondary | ICD-10-CM

## 2021-11-04 DIAGNOSIS — E559 Vitamin D deficiency, unspecified: Secondary | ICD-10-CM

## 2021-11-04 DIAGNOSIS — M25561 Pain in right knee: Secondary | ICD-10-CM

## 2021-11-04 NOTE — Patient Instructions (Signed)
Standing Labs We placed an order today for your standing lab work.   Please have your standing labs drawn in September and every 3 months  If possible, please have your labs drawn 2 weeks prior to your appointment so that the provider can discuss your results at your appointment.  Please note that you may see your imaging and lab results in MyChart before we have reviewed them. We may be awaiting multiple results to interpret others before contacting you. Please allow our office up to 72 hours to thoroughly review all of the results before contacting the office for clarification of your results.  We have open lab daily: Monday through Thursday from 1:30-4:30 PM and Friday from 1:30-4:00 PM at the office of Dr. Maeli Spacek, [facility].   Please be advised, all patients with office appointments requiring lab work will take precedent over walk-in lab work.  If possible, please come for your lab work on Monday and Friday afternoons, as you may experience shorter wait times. The office is located at [address], [address], [place], [place] [address] No appointment is necessary.   Labs are drawn by [facility]. Please bring your co-pay at the time of your lab draw.  You may receive a bill from [facility] for your lab work.  Please note if you are on Hydroxychloroquine and and an order has been placed for a Hydroxychloroquine level, you will need to have it drawn 4 hours or more after your last dose.  If you wish to have your labs drawn at another location, please call the office 24 hours in advance to send orders.  If you have any questions regarding directions or hours of operation,  please call [phone].   As a reminder, please drink plenty of water prior to coming for your lab work. Thanks!   Vaccines You are taking a medication(s) that can suppress your immune system.  The following immunizations are recommended: Flu annually Covid-19  Td/Tdap (tetanus, diphtheria,  pertussis) every 10 years Pneumonia (Prevnar 15 then Pneumovax 23 at least 1 year apart.  Alternatively, can take Prevnar 20 without needing additional dose) Shingrix: 2 doses from 4 weeks to 6 months apart  Please check with your PCP to make sure you are up to date.  

## 2021-11-05 LAB — CBC WITH DIFFERENTIAL/PLATELET
Absolute Monocytes: 468 cells/uL (ref 200–950)
Basophils Absolute: 20 cells/uL (ref 0–200)
Basophils Relative: 0.8 %
Eosinophils Absolute: 100 cells/uL (ref 15–500)
Eosinophils Relative: 4 %
HCT: 39 % (ref 35.0–45.0)
Hemoglobin: 12.9 g/dL (ref 11.7–15.5)
Lymphs Abs: 1045 cells/uL (ref 850–3900)
MCH: 31.5 pg (ref 27.0–33.0)
MCHC: 33.1 g/dL (ref 32.0–36.0)
MCV: 95.4 fL (ref 80.0–100.0)
MPV: 11.8 fL (ref 7.5–12.5)
Monocytes Relative: 18.7 %
Neutro Abs: 868 cells/uL — ABNORMAL LOW (ref 1500–7800)
Neutrophils Relative %: 34.7 %
Platelets: 208 10*3/uL (ref 140–400)
RBC: 4.09 10*6/uL (ref 3.80–5.10)
RDW: 12.7 % (ref 11.0–15.0)
Total Lymphocyte: 41.8 %
WBC: 2.5 10*3/uL — ABNORMAL LOW (ref 3.8–10.8)

## 2021-11-05 LAB — ANTI-DNA ANTIBODY, DOUBLE-STRANDED: ds DNA Ab: <1 IU/mL

## 2021-11-05 LAB — C3 AND C4
C3 Complement: 99 mg/dL (ref 83–193)
C4 Complement: 25 mg/dL (ref 15–57)

## 2021-11-05 LAB — COMPLETE METABOLIC PANEL WITH GFR
AG Ratio: 1.1 (calc) (ref 1.0–2.5)
ALT: 7 U/L (ref 6–29)
AST: 13 U/L (ref 10–30)
Albumin: 3.7 g/dL (ref 3.6–5.1)
Alkaline phosphatase (APISO): 68 U/L (ref 31–125)
BUN: 8 mg/dL (ref 7–25)
CO2: 28 mmol/L (ref 20–32)
Calcium: 9.2 mg/dL (ref 8.6–10.2)
Chloride: 108 mmol/L (ref 98–110)
Creat: 0.77 mg/dL (ref 0.50–0.97)
Globulin: 3.4 g/dL (calc) (ref 1.9–3.7)
Glucose, Bld: 64 mg/dL — ABNORMAL LOW (ref 65–99)
Potassium: 4.3 mmol/L (ref 3.5–5.3)
Sodium: 140 mmol/L (ref 135–146)
Total Bilirubin: 0.3 mg/dL (ref 0.2–1.2)
Total Protein: 7.1 g/dL (ref 6.1–8.1)
eGFR: 102 mL/min/{1.73_m2} (ref >=60)

## 2021-11-05 LAB — SEDIMENTATION RATE: Sed Rate: 17 mm/h (ref 0–20)

## 2021-11-05 LAB — PROTEIN / CREATININE RATIO, URINE
Creatinine, Urine: 131 mg/dL (ref 20–275)
Protein/Creat Ratio: 84 mg/g creat (ref 24–184)
Protein/Creatinine Ratio: 0.084 mg/mg creat (ref 0.024–0.184)
Total Protein, Urine: 11 mg/dL (ref 5–24)

## 2021-11-05 NOTE — Progress Notes (Signed)
White cell count is low and stable due to autoimmune disease.  CMP is normal.  Double-stranded DNA negative, complements normal, sed rate normal, UA negative for protein.  Labs do not indicate disease flare.  Please forward results to her nephrologist.

## 2021-12-28 NOTE — Progress Notes (Deleted)
Office Visit Note  Patient: Kristin Murphy             Date of Birth: [birth year]           MRN: [id number]             PCP: Pcp, No Referring: No ref. provider found Visit Date: 01/10/2022 Occupation: @GUAROCC @  Subjective:  No chief complaint on file.   History of Present Illness: Kristin Murphy is a 36 y.o. female ***   Activities of Daily Living:  Patient reports morning stiffness for *** {minute/hour:19697}.   Patient {ACTIONS;DENIES/REPORTS:21021675::"Denies"} nocturnal pain.  Difficulty dressing/grooming: {ACTIONS;DENIES/REPORTS:21021675::"Denies"} Difficulty climbing stairs: {ACTIONS;DENIES/REPORTS:21021675::"Denies"} Difficulty getting out of chair: {ACTIONS;DENIES/REPORTS:21021675::"Denies"} Difficulty using hands for taps, buttons, cutlery, and/or writing: {ACTIONS;DENIES/REPORTS:21021675::"Denies"}  No Rheumatology ROS completed.   PMFS History:  Patient Active Problem List   Diagnosis Date Noted  . Lupus nephritis, ISN/RPS class V (HCC) 08/10/2020  . Systemic lupus erythematosus (SLE) in adult (HCC) 11/12/2019  . Other proteinuria 11/12/2019  . Vitamin D deficiency 11/12/2019  . History of asthma 11/12/2019  . Seasonal allergies 11/12/2019  . Former smoker 11/12/2019    Past Medical History:  Diagnosis Date  . PMDD (premenstrual dysphoric disorder)   . Systemic lupus erythematosus (HCC)     Family History  Problem Relation Age of Onset  . Hypertension Mother   . Cancer Mother   . Arthritis Father   . Hypertension Other   . Diabetes Other   . Arthritis Other   . Sarcoidosis Paternal Grandmother   . Sarcoidosis Other    Past Surgical History:  Procedure Laterality Date  . KNEE ARTHROSCOPY Right 2022  . RENAL BIOPSY    . WRIST SURGERY     biopsy   Social History   Social History Narrative  . Not on file   Immunization History  Administered Date(s) Administered  . PFIZER(Purple Top)SARS-COV-2 Vaccination 12/08/2019, 12/29/2019      Objective: Vital Signs: There were no vitals taken for this visit.   Physical Exam   Musculoskeletal Exam: ***  CDAI Exam: CDAI Score: -- Patient Global: --; Provider Global: -- Swollen: --; Tender: -- Joint Exam 01/10/2022   No joint exam has been documented for this visit   There is currently no information documented on the homunculus. Go to the Rheumatology activity and complete the homunculus joint exam.  Investigation: No additional findings.  Imaging: No results found.  Recent Labs: Lab Results  Component Value Date   WBC 2.5 (L) 11/04/2021   HGB 12.9 11/04/2021   PLT 208 11/04/2021   NA 140 11/04/2021   K 4.3 11/04/2021   CL 108 11/04/2021   CO2 28 11/04/2021   GLUCOSE 64 (L) 11/04/2021   BUN 8 11/04/2021   CREATININE 0.77 11/04/2021   BILITOT 0.3 11/04/2021   AST 13 11/04/2021   ALT 7 11/04/2021   PROT 7.1 11/04/2021   CALCIUM 9.2 11/04/2021   GFRAA 118 11/12/2020   QFTBGOLDPLUS NEGATIVE 09/02/2019    Speciality Comments: PLQ eye exam: 07/07/2021 normal. 09/04/2021, OD. Follow up in 1 year.  Procedures:  No procedures performed Allergies: Amoxicillin, Paba derivatives, Penicillins, and Sulfa antibiotics   Assessment / Plan:     Visit Diagnoses: No diagnosis found.  Orders: No orders of the defined types were placed in this encounter.  No orders of the defined types were placed in this encounter.   Face-to-face time spent with patient was *** minutes. Greater than 50% of time was spent in  counseling and coordination of care.  Follow-Up Instructions: No follow-ups on file.   Earnestine Mealing, CMA  Note - This record has been created using Editor, commissioning.  Chart creation errors have been sought, but may not always  have been located. Such creation errors do not reflect on  the standard of medical care.

## 2022-01-06 ENCOUNTER — Other Ambulatory Visit: Payer: Self-pay | Admitting: Rheumatology

## 2022-01-06 DIAGNOSIS — M329 Systemic lupus erythematosus, unspecified: Secondary | ICD-10-CM

## 2022-01-06 NOTE — Telephone Encounter (Signed)
Next Visit: 01/10/2022  Last Visit: 11/04/2021  Labs: 11/04/2021 White cell count is low and stable due to autoimmune disease.  CMP is normal.    Eye exam: 07/07/2021 normal   Current Dose per office note 11/04/2021: hydroxychloroquine 200 mg twice daily and Imuran 150 mg p.o. daily  OZ:HYQMVHQI lupus erythematosus  Last Fill: 09/14/2021  Okay to refill Plaquenil?

## 2022-01-09 ENCOUNTER — Ambulatory Visit: Payer: No Typology Code available for payment source | Attending: Rheumatology | Admitting: Physician Assistant

## 2022-01-09 ENCOUNTER — Encounter: Payer: Self-pay | Admitting: Physician Assistant

## 2022-01-09 VITALS — BP 110/72 | HR 75 | Resp 15 | Ht 66.0 in | Wt 245.2 lb

## 2022-01-09 DIAGNOSIS — Z8709 Personal history of other diseases of the respiratory system: Secondary | ICD-10-CM

## 2022-01-09 DIAGNOSIS — F439 Reaction to severe stress, unspecified: Secondary | ICD-10-CM

## 2022-01-09 DIAGNOSIS — M24522 Contracture, left elbow: Secondary | ICD-10-CM

## 2022-01-09 DIAGNOSIS — M329 Systemic lupus erythematosus, unspecified: Secondary | ICD-10-CM | POA: Diagnosis not present

## 2022-01-09 DIAGNOSIS — E559 Vitamin D deficiency, unspecified: Secondary | ICD-10-CM

## 2022-01-09 DIAGNOSIS — D708 Other neutropenia: Secondary | ICD-10-CM | POA: Diagnosis not present

## 2022-01-09 DIAGNOSIS — Z79899 Other long term (current) drug therapy: Secondary | ICD-10-CM | POA: Diagnosis not present

## 2022-01-09 DIAGNOSIS — L819 Disorder of pigmentation, unspecified: Secondary | ICD-10-CM

## 2022-01-09 DIAGNOSIS — J302 Other seasonal allergic rhinitis: Secondary | ICD-10-CM

## 2022-01-09 DIAGNOSIS — M3214 Glomerular disease in systemic lupus erythematosus: Secondary | ICD-10-CM | POA: Diagnosis not present

## 2022-01-09 DIAGNOSIS — B078 Other viral warts: Secondary | ICD-10-CM

## 2022-01-09 DIAGNOSIS — M25561 Pain in right knee: Secondary | ICD-10-CM

## 2022-01-09 DIAGNOSIS — G8929 Other chronic pain: Secondary | ICD-10-CM

## 2022-01-09 DIAGNOSIS — M24521 Contracture, right elbow: Secondary | ICD-10-CM

## 2022-01-09 DIAGNOSIS — Z87891 Personal history of nicotine dependence: Secondary | ICD-10-CM

## 2022-01-09 MED ORDER — AZATHIOPRINE 50 MG PO TABS: 150.0000 mg | ORAL_TABLET | Freq: Every day | ORAL | 0 refills | Status: DC

## 2022-01-09 NOTE — Progress Notes (Signed)
Office Visit Note  Patient: Kristin Murphy             Date of Birth: 09-22-1985           MRN: [id number]             PCP: Pcp, No Referring: No ref. provider found Visit Date: 01/09/2022 Occupation: @GUAROCC @  Subjective:  Medication monitoring   History of Present Illness: Kristin Murphy is a 36 y.o. female with history of systemic lupus erythematosus. She is taking Plaquenil 200 mg twice daily and Imuran 150 mg p.o. daily.  She is tolerating both medications without any side effects.  She denies any signs or symptoms of a systemic lupus flare.  She has not had any recent rashes or photosensitivity.  She tries to avoid direct [place] exposure.  He denies any hair loss recently.  She denies any symptoms of raynaud's.  She denies any sores in her mouth or nose or symptoms of dry mouth.  She does have chronic dry eyes which have been tolerable overall.  She denies any shortness of breath or pleuritic chest pain.  She has occasional arthralgias but denies any joint swelling.  She is currently in the process of packing to move to [place] this weekend.  She will be establishing care with a new PCP as well as nephrologist in [place] but plans on keeping her rheumatologic care with [place] in [place] for now.     Activities of Daily Living:  Patient reports morning stiffness for 0 minutes.   Patient Denies nocturnal pain.  Difficulty dressing/grooming: Reports Difficulty climbing stairs: Denies Difficulty getting out of chair: Denies Difficulty using hands for taps, buttons, cutlery, and/or writing: Reports  Review of Systems  Constitutional:  Negative for fatigue.  HENT:  Positive for mouth sores and mouth dryness.   Eyes:  Positive for dryness.  Respiratory:  Negative for shortness of breath.   Cardiovascular:  Negative for chest pain and palpitations.  Gastrointestinal:  Negative for blood in stool, constipation and diarrhea.  Endocrine: Negative for increased urination.   Genitourinary:  Negative for involuntary urination.  Musculoskeletal:  Positive for joint pain, joint pain, joint swelling, myalgias, muscle tenderness and myalgias. Negative for muscle weakness and morning stiffness.  Skin:  Negative for color change, rash, hair loss and sensitivity to sunlight.  Allergic/Immunologic: Positive for susceptible to infections.  Neurological:  Positive for headaches. Negative for dizziness.  Hematological:  Negative for swollen glands.  Psychiatric/Behavioral:  Negative for depressed mood and sleep disturbance. The patient is nervous/anxious.     PMFS History:  Patient Active Problem List   Diagnosis Date Noted   Lupus nephritis, ISN/RPS class V ([place]) 08/10/2020   Systemic lupus erythematosus (SLE) in adult ([facility]) 11/12/2019   Other proteinuria 11/12/2019   Vitamin D deficiency 11/12/2019   History of asthma 11/12/2019   Seasonal allergies 11/12/2019   Former smoker 11/12/2019    Past Medical History:  Diagnosis Date   PMDD (premenstrual dysphoric disorder)    Systemic lupus erythematosus ([place])     Family History  Problem Relation Age of Onset   Hypertension Mother    Cancer Mother    Arthritis Father    Hypertension Other    Diabetes Other    Arthritis Other    Sarcoidosis Paternal Grandmother    Sarcoidosis Other    Past Surgical History:  Procedure Laterality Date   KNEE ARTHROSCOPY Right 2022   RENAL BIOPSY     WRIST SURGERY  biopsy   Social History   Social History Narrative   Not on file   Immunization History  Administered Date(s) Administered   PFIZER(Purple Top)SARS-COV-2 Vaccination 12/08/2019, 12/29/2019     Objective: Vital Signs: BP 110/72 (BP Location: Left Arm, Patient Position: Sitting, Cuff Size: Large)   Pulse 75   Resp 15   Ht 5' 6"  (1.676 m)   Wt 245 lb 3.2 oz (111.2 kg)   BMI 39.58 kg/m    Physical Exam Vitals and nursing note reviewed.  Constitutional:      Appearance: She is well-developed.   HENT:     Head: Normocephalic and atraumatic.  Eyes:     Conjunctiva/sclera: Conjunctivae normal.  Cardiovascular:     Rate and Rhythm: Normal rate and regular rhythm.     Heart sounds: Normal heart sounds.  Pulmonary:     Effort: Pulmonary effort is normal.     Breath sounds: Normal breath sounds.  Abdominal:     General: Bowel sounds are normal.     Palpations: Abdomen is soft.  Musculoskeletal:     Cervical back: Normal range of motion.  Skin:    General: Skin is warm and dry.     Capillary Refill: Capillary refill takes less than 2 seconds.  Neurological:     Mental Status: She is alert and oriented to person, place, and time.  Psychiatric:        Behavior: Behavior normal.      Musculoskeletal Exam: C-spine, thoracic spine, lumbar spine have good range of motion.  Shoulder joints, elbow joints, wrist joints, MCPs, PIPs, DIPs have good range of motion with no synovitis.  She is able to make a complete fist bilaterally.  Hip joints have good range of motion with no groin pain.  Knee joints have good range of motion.  Warmth in the right knee but no effusion noted.  Ankle joints have good range of motion with no tenderness or synovitis.  No tenderness over MTP joints.  CDAI Exam: CDAI Score: -- Patient Global: --; Provider Global: -- Swollen: --; Tender: -- Joint Exam 01/09/2022   No joint exam has been documented for this visit   There is currently no information documented on the homunculus. Go to the Rheumatology activity and complete the homunculus joint exam.  Investigation: No additional findings.  Imaging: No results found.  Recent Labs: Lab Results  Component Value Date   WBC 2.5 (L) 11/04/2021   HGB 12.9 11/04/2021   PLT 208 11/04/2021   NA 140 11/04/2021   K 4.3 11/04/2021   CL 108 11/04/2021   CO2 28 11/04/2021   GLUCOSE 64 (L) 11/04/2021   BUN 8 11/04/2021   CREATININE 0.77 11/04/2021   BILITOT 0.3 11/04/2021   AST 13 11/04/2021   ALT 7  11/04/2021   PROT 7.1 11/04/2021   CALCIUM 9.2 11/04/2021   GFRAA 118 11/12/2020   QFTBGOLDPLUS NEGATIVE 09/02/2019    Speciality Comments: PLQ eye exam: 07/07/2021 normal. Alois Cliche, OD. Follow up in 1 year.  Procedures:  No procedures performed Allergies: Amoxicillin, Paba derivatives, Penicillins, and Sulfa antibiotics     Assessment / Plan:     Visit Diagnoses: Systemic lupus erythematosus (SLE) in adult ([facility]) - Positive ANA, positive Ro, positive Sm, positive RNP, proteinuria, inflammatory arthritis, lupus nephritis: She has not had any signs or symptoms of a systemic lupus flare recently.  She has clinically been doing well taking Plaquenil 200 mg 1 tablet by mouth twice daily and Imuran 150  mg by mouth daily.  She is tolerating combination therapy without any side effects or missed doses.  She has not had any recent rashes or photosensitivity.  Discussed the importance of avoiding direct Kristin exposure as well as wearing sunscreen SPF 50 on a daily basis.  She has no signs of alopecia at this time.  She has not had any oral or nasal ulcerations.  No symptoms of Raynaud's phenomenon and no digital ulcerations or signs of gangrene were noted.  She has not had any shortness of breath or pleuritic chest pain.  Her lungs were clear to auscultation on examination today.  She has no signs of synovitis.  Overall her energy level has been stable.  She is currently in the process of moving to [facility].  She plans on establishing care with a new PCP as well as a nephrologist for close follow-up.  She would like to remain under our rheumatologic care for now but was advised to notify [place] when and if she finds a rheumatologist in [place] at which time we can send her records and transfer care.  For now she plans on following back up with [place] in 3 months for routine follow-up. Lab work from 11/04/21 was reviewed today in the office: dsDNA is negative, complements WNL, and ESR WNL.  Discussed that labs  were not consistent with active disease or a flare.  Patient plans on having updated lab work in September.  Future lab orders for [facility] remain in place. She will remain on the current dose of Plaquenil and Imuran as prescribed.  She was advised to notify [place] if she develops any signs or symptoms of a flare.  She will follow-up in the office in 3 months or sooner if needed.  Lupus nephritis, ISN/RPS class V ([facility]) - Renal biopsy Oct 21, 2019 consistent with membranous lupus nephritis (class V), proteinuria.  Followed by Dr. Joelyn Oms at [facility].  She will be establishing care with a new nephrologist in the [facility] area. Future order for protein creatinine ratio remains in place and will be drawn with upcoming lab work in September 2023.  High risk medication use - Plaquenil 200 mg twice daily, Imuran 150 mg p.o. daily.  PLQ eye exam: 07/07/2021 normal. Alois Cliche, OD. Follow up in 1 year. CBC and CMP were drawn on 11/04/2021.  She will be due to update lab work in September and every 3 months to monitor for drug toxicity.  Future orders for CBC and CMP remain in place.  Other neutropenia ([place]): White blood cell count was 2.5-stable on 11/04/2021.  Absolute neutrophils remain low but stable.  Future order for CBC with diff is in place to be drawn in September.   Contracture of joint of both elbows - Resolved.  No tenderness or inflammation noted on examination today.  Chronic pain of right knee - post meniscal tear repair by Dr. Marlou Sa in August 2022.  Chronic warmth but no effusion.   Other medical conditions are listed as follows:  Hyperpigmentation of skin  Stress  Palmar wart  Vitamin D deficiency  History of asthma  Seasonal allergies  Former smoker  Orders: No orders of the defined types were placed in this encounter.  Meds ordered this encounter  Medications   azaTHIOprine (IMURAN) 50 MG tablet    Sig: Take 3 tablets (150 mg total) by mouth daily.     Dispense:  135 tablet    Refill:  0    Follow-Up Instructions:  Return in about 3 months (around 04/11/2022) for Systemic lupus erythematosus.   Ofilia Neas, PA-C  Note - This record has been created using Dragon software.  Chart creation errors have been sought, but may not always  have been located. Such creation errors do not reflect on  the standard of medical care.

## 2022-01-09 NOTE — Patient Instructions (Signed)
Standing Labs We placed an order today for your standing lab work.   Please have your standing labs drawn in September and every 3 months   If possible, please have your labs drawn 2 weeks prior to your appointment so that the provider can discuss your results at your appointment.  Please note that you may see your imaging and lab results in MyChart before we have reviewed them. We may be awaiting multiple results to interpret others before contacting you. Please allow our office up to 72 hours to thoroughly review all of the results before contacting the office for clarification of your results.  We have open lab daily: Monday through Thursday from 1:30-4:30 PM and Friday from 1:30-4:00 PM at the office of Dr. Shaili Deveshwar, [facility].   Please be advised, all patients with office appointments requiring lab work will take precedent over walk-in lab work.  If possible, please come for your lab work on Monday and Friday afternoons, as you may experience shorter wait times. The office is located at [address], [address], [place],  [address] No appointment is necessary.   Labs are drawn by [facility]. Please bring your co-pay at the time of your lab draw.  You may receive a bill from [facility] for your lab work.  Please note if you are on Hydroxychloroquine and and an order has been placed for a Hydroxychloroquine level, you will need to have it drawn 4 hours or more after your last dose.  If you wish to have your labs drawn at another location, please call the office 24 hours in advance to send orders.  If you have any questions regarding directions or hours of operation,  please call [phone].   As a reminder, please drink plenty of water prior to coming for your lab work. Thanks!  

## 2022-01-10 ENCOUNTER — Ambulatory Visit: Payer: PRIVATE HEALTH INSURANCE | Admitting: Physician Assistant

## 2022-01-10 DIAGNOSIS — L819 Disorder of pigmentation, unspecified: Secondary | ICD-10-CM

## 2022-01-10 DIAGNOSIS — Z87891 Personal history of nicotine dependence: Secondary | ICD-10-CM

## 2022-01-10 DIAGNOSIS — M329 Systemic lupus erythematosus, unspecified: Secondary | ICD-10-CM

## 2022-01-10 DIAGNOSIS — Z8709 Personal history of other diseases of the respiratory system: Secondary | ICD-10-CM

## 2022-01-10 DIAGNOSIS — D708 Other neutropenia: Secondary | ICD-10-CM

## 2022-01-10 DIAGNOSIS — M3214 Glomerular disease in systemic lupus erythematosus: Secondary | ICD-10-CM

## 2022-01-10 DIAGNOSIS — J302 Other seasonal allergic rhinitis: Secondary | ICD-10-CM

## 2022-01-10 DIAGNOSIS — Z79899 Other long term (current) drug therapy: Secondary | ICD-10-CM

## 2022-01-10 DIAGNOSIS — E559 Vitamin D deficiency, unspecified: Secondary | ICD-10-CM

## 2022-01-10 DIAGNOSIS — M24521 Contracture, right elbow: Secondary | ICD-10-CM

## 2022-01-10 DIAGNOSIS — B078 Other viral warts: Secondary | ICD-10-CM

## 2022-01-10 DIAGNOSIS — G8929 Other chronic pain: Secondary | ICD-10-CM

## 2022-01-25 ENCOUNTER — Ambulatory Visit: Payer: PRIVATE HEALTH INSURANCE | Admitting: Rheumatology

## 2022-03-17 NOTE — Progress Notes (Signed)
Office Visit Note  Patient: [place]             Date of Birth: 05/04/1986           MRN: [id number]             PCP: Pcp, No Referring: No ref. provider found Visit Date: 03/31/2022 Occupation: @GUAROCC @  Subjective:  Intermittent right knee pain   History of Present Illness: [place] is a 36 y.o. female with history of systemic lupus erythematosus and lupus nephritis.  She remains on Plaquenil 200 mg twice daily and  Imuran 150 mg by mouth daily.  She is tolerating combination therapy without any side effects.  She requires refills of both medications to be sent to the pharmacy today.  She states that she has moved to [place] but plans on keeping as has her rheumatologist and will be establishing care with a PCP in [place].  She denies any signs or symptoms of a systemic lupus flare recently.  She states that she has occasional pain in both thumbs and her right knee joint.  She denies any joint swelling at this time.  She has occasional buckling and discomfort in her right knee especially if she is more active and having to walk prolonged periods of time.  She denies any other joint pain or joint swelling at this time.  She denies any recent rashes, Raynaud's phenomenon, or hair loss.  She denies any sores in her mouth or nose.  She has not had any sicca symptoms. This week she has been getting over an upper respiratory tract infection.  She continues to have some coughing and congestion but her symptoms have been improving.   Activities of Daily Living:  Patient reports morning stiffness for 0 minute.   Patient Reports nocturnal pain.  Difficulty dressing/grooming: Denies Difficulty climbing stairs: Denies Difficulty getting out of chair: Denies Difficulty using hands for taps, buttons, cutlery, and/or writing: Reports  Review of Systems  Constitutional:  Positive for fatigue.  HENT:  Negative for mouth sores and mouth dryness.   Eyes:  Positive for dryness.   Respiratory:  Positive for shortness of breath.   Cardiovascular:  Negative for chest pain and palpitations.  Gastrointestinal:  Negative for blood in stool, constipation and diarrhea.  Endocrine: Positive for increased urination.  Genitourinary:  Negative for involuntary urination.  Musculoskeletal:  Positive for joint pain, gait problem, joint pain, joint swelling, myalgias, muscle weakness, muscle tenderness and myalgias. Negative for morning stiffness.  Skin:  Positive for color change and sensitivity to sunlight. Negative for rash and hair loss.  Allergic/Immunologic: Negative for susceptible to infections.  Neurological:  Positive for headaches. Negative for dizziness.  Hematological:  Positive for swollen glands.  Psychiatric/Behavioral:  Positive for sleep disturbance. Negative for depressed mood. The patient is nervous/anxious.     PMFS History:  Patient Active Problem List   Diagnosis Date Noted   Lupus nephritis, ISN/RPS class V ([place]) 08/10/2020   Systemic lupus erythematosus (SLE) in adult ([facility]) 11/12/2019   Other proteinuria 11/12/2019   Vitamin D deficiency 11/12/2019   History of asthma 11/12/2019   Seasonal allergies 11/12/2019   Former smoker 11/12/2019    Past Medical History:  Diagnosis Date   PMDD (premenstrual dysphoric disorder)    Systemic lupus erythematosus ([place])     Family History  Problem Relation Age of Onset   Hypertension Mother    Cancer Mother    Arthritis Father    Hypertension Other  Diabetes Other    Arthritis Other    Sarcoidosis Paternal Grandmother    Sarcoidosis Other    Past Surgical History:  Procedure Laterality Date   KNEE ARTHROSCOPY Right 2022   RENAL BIOPSY     WRIST SURGERY     biopsy   Social History   Social History Narrative   Not on file   Immunization History  Administered Date(s) Administered   PFIZER(Purple Top)SARS-COV-2 Vaccination 12/08/2019, 12/29/2019     Objective: Vital Signs: BP 134/86 (BP  Location: Right Arm, Patient Position: Sitting, Cuff Size: Normal)   Pulse (!) 53   Resp 15   Ht $R'5\' 6"'XO$  (1.676 m)   Wt 256 lb 9.6 oz (116.4 kg)   LMP 03/07/2022 (Exact Date)   BMI 41.42 kg/m    Physical Exam Vitals and nursing note reviewed.  Constitutional:      Appearance: She is well-developed.  HENT:     Head: Normocephalic and atraumatic.  Eyes:     Conjunctiva/sclera: Conjunctivae normal.  Cardiovascular:     Rate and Rhythm: Normal rate and regular rhythm.     Heart sounds: Normal heart sounds.  Pulmonary:     Effort: Pulmonary effort is normal.     Breath sounds: Wheezing present.  Abdominal:     General: Bowel sounds are normal.     Palpations: Abdomen is soft.  Musculoskeletal:     Cervical back: Normal range of motion.  Skin:    General: Skin is warm and dry.     Capillary Refill: Capillary refill takes less than 2 seconds.  Neurological:     Mental Status: She is alert and oriented to person, place, and time.  Psychiatric:        Behavior: Behavior normal.      Musculoskeletal Exam: C-spine, thoracic spine, and lumbar spine good ROM.  Shoulder joints, elbow joints, wrist joints, MCPs, PIPs, and DIPs good ROM with no synovitis.  Complete fist formation bilaterally.  Hip joints have good ROM with no groin pain.  Right knee crepitus.  No warmth or effusion of knee joints.  No tenderness or swelling of ankle joints.   CDAI Exam: CDAI Score: -- Patient Global: --; Provider Global: -- Swollen: --; Tender: -- Joint Exam 03/31/2022   No joint exam has been documented for this visit   There is currently no information documented on the homunculus. Go to the Rheumatology activity and complete the homunculus joint exam.  Investigation: No additional findings.  Imaging: No results found.  Recent Labs: Lab Results  Component Value Date   WBC 2.5 (L) 11/04/2021   HGB 12.9 11/04/2021   PLT 208 11/04/2021   NA 140 11/04/2021   K 4.3 11/04/2021   CL 108  11/04/2021   CO2 28 11/04/2021   GLUCOSE 64 (L) 11/04/2021   BUN 8 11/04/2021   CREATININE 0.77 11/04/2021   BILITOT 0.3 11/04/2021   AST 13 11/04/2021   ALT 7 11/04/2021   PROT 7.1 11/04/2021   CALCIUM 9.2 11/04/2021   GFRAA 118 11/12/2020   QFTBGOLDPLUS NEGATIVE 09/02/2019    Speciality Comments: PLQ eye exam: 07/07/2021 normal. Alois Cliche, OD. Follow up in 1 year.  Procedures:  No procedures performed Allergies: Amoxicillin, Paba derivatives, Penicillins, and Sulfa antibiotics   Assessment / Plan:     Visit Diagnoses: Systemic lupus erythematosus (SLE) in adult ([place]) - Positive ANA, positive Ro, positive Sm, positive RNP, proteinuria, inflammatory arthritis, lupus nephritis: Patient has not had any signs or symptoms of  a systemic lupus flare.  She has clinically been doing well taking Plaquenil 200 mg 1 tablet by mouth twice daily and Imuran 150 mg daily.  She is tolerating both medications and has not missed any doses.  She requested refills to be sent to the pharmacy today.  She has occasional discomfort in both thumbs in her right knee joint but denies any joint swelling.  No synovitis was noted on examination today.  She has not had any recent rashes, alopecia, or Raynaud's phenomenon.  She has not had any oral or nasal ulcerations.  No chronic sicca symptoms.  She is currently recovering from an upper respiratory tract infection and continues to have some coughing and wheezing.  She was advised to be evaluated in urgent care if her symptoms persist or worsen.  She can hold Imuran until her symptoms have completely resolved. Lab work from 11/04/2021 was reviewed today in the office: Complements within normal limits, ESR within normal limits, double-stranded DNA negative, and protein creatinine ratio within normal limits.  The following lab work will be obtained today for further evaluation. Medication refills for Plaquenil and Imuran will be sent to the pharmacy.  She was advised to notify  us if she develops signs or symptoms of a flare.  She will follow-up in the office in 3 months or sooner if needed. - Plan: CBC with Differential/Platelet, Protein / creatinine ratio, urine, COMPLETE METABOLIC PANEL WITH GFR, Anti-DNA antibody, double-stranded, C3 and C4, Sedimentation rate  Lupus nephritis, ISN/RPS class V ([facility]) - Renal biopsy Oct 21, 2019 consistent with membranous lupus nephritis (class V), proteinuria.  Followed by Dr. Joelyn Oms at [facility].  Protein creatinine ratio ordered today.- Plan: Protein / creatinine ratio, urine  High risk medication use - Plaquenil 200 mg twice daily, Imuran 150 mg p.o. daily.  CBC and CMP were drawn on 11/04/2021.  Orders for CBC and CMP were released today.  Her next lab work will be due in February and every 3 months to monitor for drug toxicity. PLQ eye exam: 07/07/2021 normal. Alois Cliche, OD. Follow up in 1 year.  - Plan: CBC with Differential/Platelet, COMPLETE METABOLIC PANEL WITH GFR  Other neutropenia ([place]): White blood cell count was 2.5, absolute neutrophils 868 on 11/04/2021.  CBC with differential ordered today.  Contracture of joint of both elbows - Resolved.  Chronic pain of right knee - Post meniscal tear repair by Dr. Marlou Sa in August 2022.  Patient experiences intermittent discomfort in her right knee joint especially with strenuous activity.  She has occasional buckling in her right knee.  She will benefit from lower extremity muscle strengthening.  Patient was encouraged to consider physical therapy.  She plans on following back up with Dr. Marlou Sa for further evaluation. She has good range of motion of the right knee joint on examination today.  No warmth or effusion noted.   Other medical conditions are listed as follows:   Hyperpigmentation of skin  Stress  Palmar wart  Vitamin D deficiency  History of asthma  Seasonal allergies  Former smoker  Orders: Orders Placed This Encounter  Procedures   CBC with  Differential/Platelet   Protein / creatinine ratio, urine   COMPLETE METABOLIC PANEL WITH GFR   Anti-DNA antibody, double-stranded   C3 and C4   Sedimentation rate   No orders of the defined types were placed in this encounter.   Follow-Up Instructions: Return in about 3 months (around 07/01/2022) for Systemic lupus erythematosus.   Ofilia Neas,  PA-C  Note - This record has been created using [employer].  Chart creation errors have been sought, but may not always  have been located. Such creation errors do not reflect on  the standard of medical care.

## 2022-03-31 ENCOUNTER — Encounter: Payer: Self-pay | Admitting: *Deleted

## 2022-03-31 ENCOUNTER — Ambulatory Visit: Payer: PRIVATE HEALTH INSURANCE | Attending: Physician Assistant | Admitting: Physician Assistant

## 2022-03-31 ENCOUNTER — Other Ambulatory Visit: Payer: Self-pay

## 2022-03-31 ENCOUNTER — Encounter: Payer: Self-pay | Admitting: Physician Assistant

## 2022-03-31 VITALS — BP 134/86 | HR 53 | Resp 15 | Ht 66.0 in | Wt 256.6 lb

## 2022-03-31 DIAGNOSIS — M329 Systemic lupus erythematosus, unspecified: Secondary | ICD-10-CM

## 2022-03-31 DIAGNOSIS — M3214 Glomerular disease in systemic lupus erythematosus: Secondary | ICD-10-CM

## 2022-03-31 DIAGNOSIS — M25561 Pain in right knee: Secondary | ICD-10-CM

## 2022-03-31 DIAGNOSIS — D708 Other neutropenia: Secondary | ICD-10-CM

## 2022-03-31 DIAGNOSIS — F439 Reaction to severe stress, unspecified: Secondary | ICD-10-CM

## 2022-03-31 DIAGNOSIS — Z8709 Personal history of other diseases of the respiratory system: Secondary | ICD-10-CM

## 2022-03-31 DIAGNOSIS — G8929 Other chronic pain: Secondary | ICD-10-CM

## 2022-03-31 DIAGNOSIS — J302 Other seasonal allergic rhinitis: Secondary | ICD-10-CM

## 2022-03-31 DIAGNOSIS — Z79899 Other long term (current) drug therapy: Secondary | ICD-10-CM

## 2022-03-31 DIAGNOSIS — E559 Vitamin D deficiency, unspecified: Secondary | ICD-10-CM

## 2022-03-31 DIAGNOSIS — B078 Other viral warts: Secondary | ICD-10-CM

## 2022-03-31 DIAGNOSIS — M24521 Contracture, right elbow: Secondary | ICD-10-CM

## 2022-03-31 DIAGNOSIS — M24522 Contracture, left elbow: Secondary | ICD-10-CM

## 2022-03-31 DIAGNOSIS — L819 Disorder of pigmentation, unspecified: Secondary | ICD-10-CM

## 2022-03-31 DIAGNOSIS — Z87891 Personal history of nicotine dependence: Secondary | ICD-10-CM

## 2022-03-31 MED ORDER — HYDROXYCHLOROQUINE SULFATE 200 MG PO TABS: 200.0000 mg | ORAL_TABLET | Freq: Two times a day (BID) | ORAL | 0 refills | Status: DC

## 2022-03-31 MED ORDER — AZATHIOPRINE 50 MG PO TABS: 150.0000 mg | ORAL_TABLET | Freq: Every day | ORAL | 0 refills | Status: DC

## 2022-03-31 NOTE — Progress Notes (Unsigned)
Please review and sign pended plaquenil and imuran refills. Thanks!

## 2022-04-01 LAB — CBC WITH DIFFERENTIAL/PLATELET
Absolute Monocytes: 398 cells/uL (ref 200–950)
Basophils Absolute: 31 cells/uL (ref 0–200)
Basophils Relative: 1.2 %
Eosinophils Absolute: 185 cells/uL (ref 15–500)
Eosinophils Relative: 7.1 %
HCT: 37.4 % (ref 35.0–45.0)
Hemoglobin: 12.6 g/dL (ref 11.7–15.5)
Lymphs Abs: 1407 cells/uL (ref 850–3900)
MCH: 30.4 pg (ref 27.0–33.0)
MCHC: 33.7 g/dL (ref 32.0–36.0)
MCV: 90.3 fL (ref 80.0–100.0)
MPV: 12 fL (ref 7.5–12.5)
Monocytes Relative: 15.3 %
Neutro Abs: 580 cells/uL — ABNORMAL LOW (ref 1500–7800)
Neutrophils Relative %: 22.3 %
Platelets: 197 10*3/uL (ref 140–400)
RBC: 4.14 10*6/uL (ref 3.80–5.10)
RDW: 13.3 % (ref 11.0–15.0)
Total Lymphocyte: 54.1 %
WBC: 2.6 10*3/uL — ABNORMAL LOW (ref 3.8–10.8)

## 2022-04-01 LAB — COMPLETE METABOLIC PANEL WITH GFR
AG Ratio: 1.1 (calc) (ref 1.0–2.5)
ALT: 8 U/L (ref 6–29)
AST: 15 U/L (ref 10–30)
Albumin: 3.8 g/dL (ref 3.6–5.1)
Alkaline phosphatase (APISO): 69 U/L (ref 31–125)
BUN: 10 mg/dL (ref 7–25)
CO2: 28 mmol/L (ref 20–32)
Calcium: 8.9 mg/dL (ref 8.6–10.2)
Chloride: 106 mmol/L (ref 98–110)
Creat: 0.73 mg/dL (ref 0.50–0.97)
Globulin: 3.5 g/dL (calc) (ref 1.9–3.7)
Glucose, Bld: 87 mg/dL (ref 65–99)
Potassium: 4.3 mmol/L (ref 3.5–5.3)
Sodium: 140 mmol/L (ref 135–146)
Total Bilirubin: 0.3 mg/dL (ref 0.2–1.2)
Total Protein: 7.3 g/dL (ref 6.1–8.1)
eGFR: 109 mL/min/{1.73_m2} (ref >=60)

## 2022-04-01 LAB — PROTEIN / CREATININE RATIO, URINE
Creatinine, Urine: 209 mg/dL (ref 20–275)
Protein/Creat Ratio: 340 mg/g creat — ABNORMAL HIGH (ref 24–184)
Protein/Creatinine Ratio: 0.34 mg/mg creat — ABNORMAL HIGH (ref 0.024–0.184)
Total Protein, Urine: 71 mg/dL — ABNORMAL HIGH (ref 5–24)

## 2022-04-01 LAB — ANTI-DNA ANTIBODY, DOUBLE-STRANDED: ds DNA Ab: <1 IU/mL

## 2022-04-01 LAB — C3 AND C4
C3 Complement: 103 mg/dL (ref 83–193)
C4 Complement: 25 mg/dL (ref 15–57)

## 2022-04-01 LAB — SEDIMENTATION RATE: Sed Rate: 25 mm/h — ABNORMAL HIGH (ref 0–20)

## 2022-04-04 NOTE — Progress Notes (Signed)
White blood cell count remains low: 2.6.  Absolute neutrophil count low 580-trending down.  Rest of CBC within normal limits.  CMP within normal limits.  Complements within normal limits.  Double-stranded DNA negative. ESR is borderline elevated-25.   Protein creatinine ratio is borderline elevated.  Please clarify when she will be seeing her nephrologist.

## 2022-04-05 NOTE — Progress Notes (Signed)
Please clarify if she plans on following up with her previous nephrologist or if she would like a new referral to someone in [place]?

## 2022-04-07 ENCOUNTER — Telehealth: Payer: Self-pay

## 2022-04-07 DIAGNOSIS — M3214 Glomerular disease in systemic lupus erythematosus: Secondary | ICD-10-CM

## 2022-04-07 NOTE — Telephone Encounter (Signed)
Referral placed. I called patient and advised. 

## 2022-04-07 NOTE — Telephone Encounter (Signed)
Okay to place nephrology referral per patient's request.

## 2022-04-07 NOTE — Telephone Encounter (Signed)
Patient left a message requesting a referral for Dr. Chrisandra Netters at [facility] in [place], [place] since she has now moved.  Location, phone and fax number were provided by patinet:  [address]  [place], [place]  Phone: [phone] Fax: [phone]  Okay to place nephrology referral?

## 2022-05-09 ENCOUNTER — Other Ambulatory Visit: Payer: Self-pay | Admitting: *Deleted

## 2022-05-09 DIAGNOSIS — M329 Systemic lupus erythematosus, unspecified: Secondary | ICD-10-CM

## 2022-05-09 MED ORDER — HYDROXYCHLOROQUINE SULFATE 200 MG PO TABS: 200.0000 mg | ORAL_TABLET | Freq: Two times a day (BID) | ORAL | 0 refills | Status: DC

## 2022-05-09 NOTE — Telephone Encounter (Signed)
Next Visit: 07/06/2022  Last Visit: 03/31/2022  Labs: 03/31/2022 White blood cell count remains low: 2.6.  Absolute neutrophil count low 580-trending down.  Rest of CBC within normal limits.  CMP within normal limits.   Eye exam: 07/07/2021 normal.   Current Dose per office note 03/31/2022: Plaquenil 200 mg twice daily   KG:OVPCHEK lupus erythematosus (SLE) in adult   Okay to refill Plaquenil?

## 2022-05-24 ENCOUNTER — Telehealth: Payer: Self-pay

## 2022-05-24 NOTE — Telephone Encounter (Signed)
Insurance needed extended auth weeks ago for Imuran when rx was sent by paper fax to [employer] [employer] while she was in [place]. Now patient needs RX sent to [place], [place]. Pharmacy:[facility] [address]  Also requesting a new handicap sticker form be faxed to [facility].  Please reach patient at [phone]

## 2022-05-26 MED ORDER — AZATHIOPRINE 50 MG PO TABS: 150.0000 mg | ORAL_TABLET | Freq: Every day | ORAL | 0 refills | Status: DC

## 2022-05-26 NOTE — Telephone Encounter (Signed)
Prescription resent to requested pharmacy.   Please advise if okay to give handicap placard.

## 2022-05-26 NOTE — Addendum Note (Signed)
Addended by: Henriette Combs on: 05/26/2022 10:05 AM   Modules accepted: Orders

## 2022-06-26 NOTE — Progress Notes (Signed)
Office Visit Note  Patient: Kristin Murphy             Date of Birth: [birth year]           MRN: [id number]             PCP: Pcp, No Referring: No ref. provider found Visit Date: 07/06/2022 Occupation: @GUAROCC @  Subjective:  Pain in right knee  History of Present Illness: Kristin Murphy is a 37 y.o. female does not history of systemic lupus and lupus nephritis.  She states she has moved to [facility].  She will be transferring her care to [facility] rheumatology and nephrology.  She has been under a lot of stress recently.  She has been experiencing increased pain and stiffness in her bilateral hands and her bilateral knee joints.  She has not seen any visible swelling.  She denies any history of oral ulcers, nasal ulcers, malar rash, sicca symptoms, photosensitivity, Raynaud's or lymphadenopathy.    Activities of Daily Living:  Patient reports morning stiffness for 30 minutes.   Patient Reports nocturnal pain.  Difficulty dressing/grooming: Denies Difficulty climbing stairs: Reports Difficulty getting out of chair: Denies Difficulty using hands for taps, buttons, cutlery, and/or writing: Reports  Review of Systems  Constitutional:  Positive for fatigue.  HENT: Negative.  Negative for mouth sores and mouth dryness.   Eyes: Negative.  Negative for dryness.  Respiratory:  Negative for difficulty breathing.   Cardiovascular: Negative.  Negative for chest pain and palpitations.  Gastrointestinal: Negative.  Negative for blood in stool, constipation and diarrhea.  Endocrine: Negative.  Negative for increased urination.  Genitourinary: Negative.  Negative for involuntary urination.  Musculoskeletal:  Positive for joint pain, joint pain, joint swelling, myalgias, muscle weakness, morning stiffness, muscle tenderness and myalgias. Negative for gait problem.  Skin: Negative.  Negative for color change, rash, hair loss and sensitivity to sunlight.  Allergic/Immunologic: Negative.   Negative for susceptible to infections.  Neurological:  Positive for headaches. Negative for dizziness.  Hematological: Negative.  Negative for swollen glands.  Psychiatric/Behavioral:  Positive for sleep disturbance. Negative for depressed mood. The patient is not nervous/anxious.     PMFS History:  Patient Active Problem List   Diagnosis Date Noted   Lupus nephritis, ISN/RPS class V (HCC) 08/10/2020   Systemic lupus erythematosus (SLE) in adult (HCC) 11/12/2019   Other proteinuria 11/12/2019   Vitamin D deficiency 11/12/2019   History of asthma 11/12/2019   Seasonal allergies 11/12/2019   Former smoker 11/12/2019    Past Medical History:  Diagnosis Date   PMDD (premenstrual dysphoric disorder)    Systemic lupus erythematosus (HCC)     Family History  Problem Relation Age of Onset   Hypertension Mother    Cancer Mother    Arthritis Father    Hypertension Other    Diabetes Other    Arthritis Other    Sarcoidosis Paternal Grandmother    Sarcoidosis Other    Past Surgical History:  Procedure Laterality Date   KNEE ARTHROSCOPY Right 2022   RENAL BIOPSY     WRIST SURGERY     biopsy   Social History   Social History Narrative   Not on file   Immunization History  Administered Date(s) Administered   PFIZER(Purple Top)SARS-COV-2 Vaccination 12/08/2019, 12/29/2019     Objective: Vital Signs: BP 116/80 (BP Location: Left Arm, Patient Position: Sitting, Cuff Size: Large)   Pulse 76   Ht 5\' 6"  (1.676 m)   Wt 269 lb (122  kg)   BMI 43.42 kg/m    Physical Exam Vitals and nursing note reviewed.  Constitutional:      Appearance: She is well-developed.  HENT:     Head: Normocephalic and atraumatic.  Eyes:     Conjunctiva/sclera: Conjunctivae normal.  Cardiovascular:     Rate and Rhythm: Normal rate and regular rhythm.     Heart sounds: Normal heart sounds.  Pulmonary:     Effort: Pulmonary effort is normal.     Breath sounds: Normal breath sounds.  Abdominal:      General: Bowel sounds are normal.     Palpations: Abdomen is soft.  Musculoskeletal:     Cervical back: Normal range of motion.  Lymphadenopathy:     Cervical: No cervical adenopathy.  Skin:    General: Skin is warm and dry.     Capillary Refill: Capillary refill takes less than 2 seconds.  Neurological:     Mental Status: She is alert and oriented to person, place, and time.  Psychiatric:        Behavior: Behavior normal.      Musculoskeletal Exam: Apical spine was in good range of motion.  Shoulders, elbows, wrist joint, MCPs PIPs and DIPs Juengel range of motion with no synovitis.  Hip joints and knee joints in good range of motion.  No warmth swelling or effusion was noted.  There was no tenderness over ankles or MTPs.  CDAI Exam: CDAI Score: -- Patient Global: --; Provider Global: -- Swollen: --; Tender: -- Joint Exam 07/06/2022   No joint exam has been documented for this visit   There is currently no information documented on the homunculus. Go to the Rheumatology activity and complete the homunculus joint exam.  Investigation: No additional findings.  Imaging: No results found.  Recent Labs: Lab Results  Component Value Date   WBC 2.6 (L) 03/31/2022   HGB 12.6 03/31/2022   PLT 197 03/31/2022   NA 140 03/31/2022   K 4.3 03/31/2022   CL 106 03/31/2022   CO2 28 03/31/2022   GLUCOSE 87 03/31/2022   BUN 10 03/31/2022   CREATININE 0.73 03/31/2022   BILITOT 0.3 03/31/2022   AST 15 03/31/2022   ALT 8 03/31/2022   PROT 7.3 03/31/2022   CALCIUM 8.9 03/31/2022   GFRAA 118 11/12/2020   QFTBGOLDPLUS NEGATIVE 09/02/2019     Speciality Comments: PLQ eye exam: 07/07/2021 normal. Alois Cliche, OD. Follow up in 1 year.  Procedures:  No procedures performed Allergies: Amoxicillin, Paba derivatives, Penicillins, and Sulfa antibiotics   Assessment / Plan:     Visit Diagnoses: Systemic lupus erythematosus (SLE) in adult ([place]) - Positive ANA, positive Ro, positive Sm,  positive RNP, proteinuria, inflammatory arthritis, lupus nephritis: -She is doing well on the combination of hydroxychloroquine and Imuran.  She has been taking her medications on a regular basis.  She denies any history of oral ulcers, nasal ulcers, malar rash, photosensitivity, Raynaud's, inflammatory arthritis or lymphadenopathy.  Plan: Anti-DNA antibody, double-stranded, C3 and C4, Sedimentation rate, Protein / creatinine ratio, urine today.  Patient is transferring her care to [facility].  She will sign release of information to transfer records.  Lupus nephritis, ISN/RPS class V (HCC) - Renal biopsy Oct 21, 2019 consistent with membranous lupus nephritis (class V), proteinuria.  Followed by Dr. Joelyn Oms at [facility].  She is transferring her care to [facility] and is establishing with a nephrologist there.  High risk medication use - Plaquenil 200 mg twice daily, Imuran 150 mg  p.o. daily. PLQ eye exam: 07/07/2021 -07/01/22 CBC WBC 3.0, hemoglobin 13.2, platelets 234, UA showed negative protein.  I will check CMP today.  Patient was advised to get eye examination.  Plan: COMPLETE METABOLIC PANEL WITH GFR.  She will get future labs to [facility].  Other neutropenia (HCC)-stable.  Last WBC count was 3.0.  Contracture of joint of both elbows - Resolved.  Chronic pain of right knee - Post meniscal tear repair by Dr. Marlou Sa in August 2022.  She is doing better.  She had good range of motion in the knee joint today.  She requested a temporary handicap placard as she does get knee joint discomfort.  The form was filled and given to her.  Other medical problems are noted as follows:  Hyperpigmentation of skin  Palmar wart  Stress-patient had been a lot of stress at her previous job.  She has switched jobs and her stress level has been better.  Vitamin D deficiency  Seasonal allergies  History of asthma  Former smoker  Orders: Orders Placed This Encounter  Procedures   COMPLETE METABOLIC PANEL WITH  GFR   Anti-DNA antibody, double-stranded   C3 and C4   Sedimentation rate   Protein / creatinine ratio, urine   No orders of the defined types were placed in this encounter.   Follow-Up Instructions: Return if symptoms worsen or fail to improve, for Systemic lupus.   Bo Merino, MD  Note - This record has been created using Editor, commissioning.  Chart creation errors have been sought, but may not always  have been located. Such creation errors do not reflect on  the standard of medical care.

## 2022-06-29 ENCOUNTER — Telehealth: Payer: Self-pay | Admitting: Rheumatology

## 2022-06-29 NOTE — Telephone Encounter (Signed)
Attempted to contact the patient and left message to advise patient we cannot provide virtual visits in [place]. She will have to establish with a rheumatologist in [place].

## 2022-06-29 NOTE — Telephone Encounter (Signed)
Last visit: 03/31/2022 Dx: Systemic lupus erythematosus (SLE) in adult   Patient is requesting a virtual visit on 07/06/2022 due to moving to [place]. Please advise.

## 2022-06-29 NOTE — Telephone Encounter (Signed)
Patient states she recently moved to [place], [place] and started a new job.  Patient is requesting her appointment on 07/06/22 with Dr. Estanislado Pandy be virtual.  Patient requested a return call.

## 2022-06-29 NOTE — Telephone Encounter (Signed)
We cannot provide virtual visits in [place].  She will have to establish with a rheumatologist in [place].

## 2022-07-06 ENCOUNTER — Telehealth: Payer: Self-pay

## 2022-07-06 ENCOUNTER — Encounter: Payer: Self-pay | Admitting: Rheumatology

## 2022-07-06 ENCOUNTER — Ambulatory Visit: Payer: BC Managed Care – PPO | Attending: Rheumatology | Admitting: Rheumatology

## 2022-07-06 VITALS — BP 116/80 | HR 76 | Ht 66.0 in | Wt 269.0 lb

## 2022-07-06 DIAGNOSIS — M329 Systemic lupus erythematosus, unspecified: Secondary | ICD-10-CM

## 2022-07-06 DIAGNOSIS — D708 Other neutropenia: Secondary | ICD-10-CM

## 2022-07-06 DIAGNOSIS — E559 Vitamin D deficiency, unspecified: Secondary | ICD-10-CM

## 2022-07-06 DIAGNOSIS — Z79899 Other long term (current) drug therapy: Secondary | ICD-10-CM

## 2022-07-06 DIAGNOSIS — G8929 Other chronic pain: Secondary | ICD-10-CM

## 2022-07-06 DIAGNOSIS — M24521 Contracture, right elbow: Secondary | ICD-10-CM

## 2022-07-06 DIAGNOSIS — J302 Other seasonal allergic rhinitis: Secondary | ICD-10-CM

## 2022-07-06 DIAGNOSIS — M24522 Contracture, left elbow: Secondary | ICD-10-CM

## 2022-07-06 DIAGNOSIS — L819 Disorder of pigmentation, unspecified: Secondary | ICD-10-CM

## 2022-07-06 DIAGNOSIS — M3214 Glomerular disease in systemic lupus erythematosus: Secondary | ICD-10-CM | POA: Diagnosis not present

## 2022-07-06 DIAGNOSIS — Z87891 Personal history of nicotine dependence: Secondary | ICD-10-CM

## 2022-07-06 DIAGNOSIS — Z8709 Personal history of other diseases of the respiratory system: Secondary | ICD-10-CM

## 2022-07-06 DIAGNOSIS — F439 Reaction to severe stress, unspecified: Secondary | ICD-10-CM

## 2022-07-06 DIAGNOSIS — M25561 Pain in right knee: Secondary | ICD-10-CM

## 2022-07-06 DIAGNOSIS — B078 Other viral warts: Secondary | ICD-10-CM

## 2022-07-06 NOTE — Patient Instructions (Signed)
Standing Labs We placed an order today for your standing lab work.   Please have your standing labs drawn in May  Please have your labs drawn 2 weeks prior to your appointment so that the provider can discuss your lab results at your appointment.  Please note that you may see your imaging and lab results in MyChart before we have reviewed them. We will contact you once all results are reviewed. Please allow our office up to 72 hours to thoroughly review all of the results before contacting the office for clarification of your results.  Lab hours are:   Monday through Thursday from 8:00 am -12:30 pm and 1:00 pm-5:00 pm and Friday from 8:00 am-12:00 pm.  Please be advised, all patients with office appointments requiring lab work will take precedent over walk-in lab work.   Labs are drawn by [facility]. Please bring your co-pay at the time of your lab draw.  You may receive a bill from [facility] for your lab work.  Please note if you are on Hydroxychloroquine and and an order has been placed for a Hydroxychloroquine level, you will need to have it drawn 4 hours or more after your last dose.  If you wish to have your labs drawn at another location, please call the office 24 hours in advance so we can fax the orders.  The office is located at [address], [address], [place], [place] [address] No appointment is necessary.    If you have any questions regarding directions or hours of operation,  please call [phone].   As a reminder, please drink plenty of water prior to coming for your lab work. Thanks!   Vaccines You are taking a medication(s) that can suppress your immune system.  The following immunizations are recommended: Flu annually Covid-19  RSV Td/Tdap (tetanus, diphtheria, pertussis) every 10 years Pneumonia (Prevnar 15 then Pneumovax 23 at least 1 year apart.  Alternatively, can take Prevnar 20 without needing additional dose) Shingrix: 2 doses from 4 weeks to 6 months  apart  Please check with your PCP to make sure you are up to date.  

## 2022-07-06 NOTE — Telephone Encounter (Signed)
Gave patient handicap sticker form for DMV and Plaqu Eye Exam form and reminded she is due for exam.

## 2022-07-07 LAB — SEDIMENTATION RATE: Sed Rate: 19 mm/h (ref 0–20)

## 2022-07-07 LAB — COMPLETE METABOLIC PANEL WITH GFR
AG Ratio: 1.1 (calc) (ref 1.0–2.5)
ALT: 6 U/L (ref 6–29)
AST: 14 U/L (ref 10–30)
Albumin: 3.7 g/dL (ref 3.6–5.1)
Alkaline phosphatase (APISO): 68 U/L (ref 31–125)
BUN: 9 mg/dL (ref 7–25)
CO2: 26 mmol/L (ref 20–32)
Calcium: 8.8 mg/dL (ref 8.6–10.2)
Chloride: 108 mmol/L (ref 98–110)
Creat: 0.75 mg/dL (ref 0.50–0.97)
Globulin: 3.3 g/dL (calc) (ref 1.9–3.7)
Glucose, Bld: 62 mg/dL — ABNORMAL LOW (ref 65–99)
Potassium: 4 mmol/L (ref 3.5–5.3)
Sodium: 141 mmol/L (ref 135–146)
Total Bilirubin: 0.2 mg/dL (ref 0.2–1.2)
Total Protein: 7 g/dL (ref 6.1–8.1)
eGFR: 105 mL/min/{1.73_m2} (ref >=60)

## 2022-07-07 LAB — PROTEIN / CREATININE RATIO, URINE
Creatinine, Urine: 91 mg/dL (ref 20–275)
Protein/Creat Ratio: 110 mg/g creat (ref 24–184)
Protein/Creatinine Ratio: 0.11 mg/mg creat (ref 0.024–0.184)
Total Protein, Urine: 10 mg/dL (ref 5–24)

## 2022-07-07 LAB — C3 AND C4
C3 Complement: 100 mg/dL (ref 83–193)
C4 Complement: 23 mg/dL (ref 15–57)

## 2022-07-07 LAB — ANTI-DNA ANTIBODY, DOUBLE-STRANDED: ds DNA Ab: <1 IU/mL

## 2022-07-09 NOTE — Progress Notes (Signed)
CMP normal, complements normal, double-stranded DNA negative, sed rate normal urine protein creatinine ratio is elevated at 340.  Labs are stable.  Please forward labs to patient's nephrologist.

## 2022-07-26 ENCOUNTER — Telehealth: Payer: Self-pay | Admitting: Rheumatology

## 2022-07-26 DIAGNOSIS — M329 Systemic lupus erythematosus, unspecified: Secondary | ICD-10-CM

## 2022-07-26 DIAGNOSIS — M3214 Glomerular disease in systemic lupus erythematosus: Secondary | ICD-10-CM

## 2022-07-26 NOTE — Telephone Encounter (Signed)
Referral placed.

## 2022-07-26 NOTE — Telephone Encounter (Signed)
Patient called requesting a referral for transfer of care be faxed to [facility]. Fax # [phone]

## 2022-08-04 DIAGNOSIS — M3214 Glomerular disease in systemic lupus erythematosus: Secondary | ICD-10-CM | POA: Diagnosis not present

## 2022-08-04 DIAGNOSIS — M329 Systemic lupus erythematosus, unspecified: Secondary | ICD-10-CM | POA: Diagnosis not present

## 2022-08-04 DIAGNOSIS — R319 Hematuria, unspecified: Secondary | ICD-10-CM | POA: Diagnosis not present

## 2022-08-06 ENCOUNTER — Other Ambulatory Visit: Payer: Self-pay | Admitting: Physician Assistant

## 2022-08-06 DIAGNOSIS — M329 Systemic lupus erythematosus, unspecified: Secondary | ICD-10-CM

## 2022-08-07 NOTE — Telephone Encounter (Signed)
Next Visit: Patient is transferring care to [facility]  Last Visit: 07/06/2022  Labs: 07/06/2022  CMP normal, complements normal, double-stranded DNA negative, sed rate normal urine protein creatinine ratio is elevated at 340.  Labs are stable.  Please forward labs to patient's nephrologist.07/05/2022  CBC CBC WBC 3.0, hemoglobin 13.2, platelets 234, UA showed negative protein   Eye exam: 07/07/2021   Current Dose per office note 07/06/2022 : Plaquenil 200 mg twice daily and  Imuran 150 mg p.o. daily   IN:[id number] lupus erythematosus (SLE) in adult   Last Fill: 05/09/2022  Okay to refill Plaquenil and Imuran?

## 2022-08-14 ENCOUNTER — Telehealth: Payer: Self-pay | Admitting: Orthopaedic Surgery

## 2022-08-14 NOTE — Telephone Encounter (Signed)
Patient would like for Lovena Le to call her because she know what is going on with her. [id number]

## 2022-09-04 ENCOUNTER — Other Ambulatory Visit (INDEPENDENT_AMBULATORY_CARE_PROVIDER_SITE_OTHER): Payer: BC Managed Care – PPO

## 2022-09-04 ENCOUNTER — Ambulatory Visit (INDEPENDENT_AMBULATORY_CARE_PROVIDER_SITE_OTHER): Payer: BC Managed Care – PPO | Admitting: Surgical

## 2022-09-04 DIAGNOSIS — M25561 Pain in right knee: Secondary | ICD-10-CM | POA: Diagnosis not present

## 2022-09-04 DIAGNOSIS — M25562 Pain in left knee: Secondary | ICD-10-CM | POA: Diagnosis not present

## 2022-09-06 ENCOUNTER — Encounter: Payer: Self-pay | Admitting: Surgical

## 2022-09-06 NOTE — Progress Notes (Signed)
Office Visit Note   Patient: Kristin Murphy           Date of Birth: 04-15-1986           MRN: [id number] Visit Date: 09/04/2022 Requested by: No referring provider defined for this encounter. PCP: Pcp, No  Subjective: Chief Complaint  Patient presents with   Right Knee - Pain   Left Knee - Pain    HPI: Kristin Murphy is a 37 y.o. female who presents to the office reporting bilateral knee pain, right greater than left.  Patient states that she has had increase in her knee pain since November 2023.  She has noticed an increase in the grinding sensation in her right knee.  Mostly localizes pain to the anterior aspect of the right knee diffusely.  She has associated stiffness and feels like the knee wants to give out on her at times.  [place] and is seeing a rheumatologist there.  Pain does not wake her up at night.  She has no radiation of pain.  No radicular pain.  No groin pain.  Taking Tylenol only.  Cannot take NSAIDs.  Has history of lupus..                ROS: All systems reviewed are negative as they relate to the chief complaint within the history of present illness.  Patient denies fevers or chills.  Assessment & Plan: Visit Diagnoses:  1. Pain in both knees, unspecified chronicity   2. Right knee pain, unspecified chronicity     Plan: Patient is a 37 year old female who presents for evaluation of primarily right-sided knee pain.  She has history of prior right knee arthroscopy with debridement and microfracture on 01/17/2021.  Increased pain recently without injury.  Diffuse pain throughout the anterior knee.  Minimal degenerative changes of the medial compartment on radiographs today.  With increased crepitus and pain as well as her history of microfracture of chondral defect, plan for further evaluation of right knee pain with MRI right knee.  We can discuss these results over the phone and then she will decide whether or not she wants to  continue treatment at this office or if she would like to be referred to another orthopedic provider that is closer to [place] new [place].  Follow-Up Instructions: No follow-ups on file.   Orders:  Orders Placed This Encounter  Procedures   XR KNEE 3 VIEW RIGHT   XR Knee 1-2 Views Left   MR Knee Right w/o contrast   No orders of the defined types were placed in this encounter.     Procedures: No procedures performed   Clinical Data: No additional findings.  Objective: Vital Signs: There were no vitals taken for this visit.  Physical Exam:  Constitutional: Patient appears well-developed HEENT:  Head: Normocephalic Eyes:EOM are normal Neck: Normal range of motion Cardiovascular: Normal rate Pulmonary/chest: Effort normal Neurologic: Patient is alert Skin: Skin is warm Psychiatric: Patient has normal mood and affect  Ortho Exam: Ortho exam demonstrates right knee with 0 degrees extension and 120 degrees of knee flexion.  She has ability to perform straight leg raise though she has about a 10 degree extensor lag.  No pain with hip range of motion.  She does have significant patellofemoral crepitus noted with passive motion knee that is increased compared with contralateral side.  Trace effusion is noted.  She has tenderness primarily over the medial compartment with no  tenderness over the lateral joint line.  Stable to anterior and posterior drawer sign.  Stable to varus and valgus stress at 0 and 30 degrees.  Specialty Comments:  No specialty comments available.  Imaging: No results found.   PMFS History: Patient Active Problem List   Diagnosis Date Noted   Lupus nephritis, ISN/RPS class V 08/10/2020   Systemic lupus erythematosus (SLE) in adult 11/12/2019   Other proteinuria 11/12/2019   Vitamin D deficiency 11/12/2019   History of asthma 11/12/2019   Seasonal allergies 11/12/2019   Former smoker 11/12/2019   Past Medical History:  Diagnosis Date   PMDD  (premenstrual dysphoric disorder)    Systemic lupus erythematosus (HCC)     Family History  Problem Relation Age of Onset   Hypertension Mother    Cancer Mother    Arthritis Father    Hypertension Other    Diabetes Other    Arthritis Other    Sarcoidosis Paternal Grandmother    Sarcoidosis Other     Past Surgical History:  Procedure Laterality Date   KNEE ARTHROSCOPY Right 2022   RENAL BIOPSY     WRIST SURGERY     biopsy   Social History   Occupational History   Not on file  Tobacco Use   Smoking status: Former    Types: Cigarettes    Passive exposure: Current   Smokeless tobacco: Never   Tobacco comments:    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

## 2022-09-28 ENCOUNTER — Telehealth: Payer: Self-pay | Admitting: Orthopedic Surgery

## 2022-09-28 NOTE — Telephone Encounter (Signed)
Holding as a reminder once MRI has been completed.

## 2022-09-28 NOTE — Telephone Encounter (Signed)
Called pt and spoke with her about setting an appt for MRI review after 5/13. Pt states she leaves in [place] and can she please have results read to her over the phone after results come back. Pt phone number is [phone]

## 2022-10-09 ENCOUNTER — Other Ambulatory Visit: Payer: BC Managed Care – PPO

## 2022-10-09 ENCOUNTER — Ambulatory Visit
Admission: RE | Admit: 2022-10-09 | Discharge: 2022-10-09 | Disposition: A | Discharge status: Home / Self Care | Payer: BC Managed Care – PPO | Source: Ambulatory Visit | Attending: Orthopedic Surgery | Admitting: Orthopedic Surgery

## 2022-10-09 DIAGNOSIS — M25561 Pain in right knee: Secondary | ICD-10-CM

## 2022-10-10 NOTE — Telephone Encounter (Signed)
Results still pending.

## 2022-10-16 NOTE — Telephone Encounter (Signed)
I called and we discussed results.  She is going to call the imaging facility and request a disc which she forgot to do at the time of MRI scan.  She will send this disc to her new orthopedic provider in [place].

## 2023-01-21 ENCOUNTER — Emergency Department (HOSPITAL_COMMUNITY): Payer: BC Managed Care – PPO

## 2023-01-21 ENCOUNTER — Other Ambulatory Visit: Payer: Self-pay

## 2023-01-21 ENCOUNTER — Emergency Department (HOSPITAL_COMMUNITY)
Admission: EM | Admit: 2023-01-21 | Discharge: 2023-01-21 | Disposition: A | Discharge status: Home / Self Care | Payer: BC Managed Care – PPO | Source: Home / Self Care | Attending: Emergency Medicine | Admitting: Emergency Medicine

## 2023-01-21 DIAGNOSIS — M25562 Pain in left knee: Secondary | ICD-10-CM

## 2023-01-21 DIAGNOSIS — M25462 Effusion, left knee: Secondary | ICD-10-CM | POA: Insufficient documentation

## 2023-01-21 MED ORDER — HYDROCODONE-ACETAMINOPHEN 5-325 MG PO TABS
1.0000 | ORAL_TABLET | Freq: Once | ORAL | Status: AC
  Administered 2023-01-21: 1 via ORAL
  Filled 2023-01-21: qty 1

## 2023-01-21 NOTE — ED Provider Notes (Signed)
[place] EMERGENCY DEPARTMENT AT [facility] Provider Note   CSN: [id number] Arrival date & time: 01/21/23  8295     History  Chief Complaint  Patient presents with   Leg Pain    Kristin Murphy is a 37 y.o. female, history of lupus, nephritis, who presents to the ED secondary to left knee pain, that is been going on for the last few months, but has gotten worse this week.  She states that about a week ago, she twisted her leg, and has had more knee pain since then.  She notes that yesterday, she felt like her knee was so weak and painful, that she could not hardly stand.  She denies any kind of redness, but does endorse slight swelling and warmth.  Cannot take ibuprofen or naproxen, so has been taking some Tylenol without relief.  Denies any trauma to the area, or IV drug use.  Has had a knee replacement on the right knee, but no replacement/hardware in the left knee.  Pain is worse when walking, or bearing any weight.  Worse with movement as well.     Home Medications Prior to Admission medications   Medication Sig Start Date End Date Taking? Authorizing Provider  Acetaminophen (TYLENOL PO) Take by mouth as needed. Patient not taking: Reported on 07/06/2022    [provider]  azaTHIOprine (IMURAN) 50 MG tablet TAKE THREE TABLETS BY MOUTH DAILY 08/07/22   Gearldine Bienenstock, PA-C  cetirizine (ZYRTEC) 10 MG tablet Take 10 mg by mouth daily.    [provider]  hydroxychloroquine (PLAQUENIL) 200 MG tablet TAKE 1 TABLET BY MOUTH TWICE A DAY 08/07/22   Gearldine Bienenstock, PA-C      Allergies    Amoxicillin, Paba derivatives, Penicillins, and Sulfa antibiotics    Review of Systems   Review of Systems  Cardiovascular:  Negative for leg swelling.  Musculoskeletal:  Negative for back pain.       +L knee pain    Physical Exam Updated Vital Signs BP (!) 145/92 (BP Location: Right Arm)   Pulse 73   Temp 98.2 F (36.8 C) (Oral)   Resp 18   SpO2 99%  Physical  Exam Vitals and nursing note reviewed.  Constitutional:      General: She is not in acute distress.    Appearance: She is well-developed.  HENT:     Head: Normocephalic and atraumatic.  Eyes:     General:        Right eye: No discharge.        Left eye: No discharge.     Conjunctiva/sclera: Conjunctivae normal.  Cardiovascular:     Pulses:          Dorsalis pedis pulses are 2+ on the right side and 2+ on the left side.     Comments: No calf tenderness or swelling of LLE except for knee.  Pulmonary:     Effort: No respiratory distress.  Musculoskeletal:     Comments: Left Knee: Tenderness to palpation of area inferior and superior of patella. A mild effusion is present.  Negative anterior and posterior drawer. Negative Mcmurray's. +Patellar stability. Negative valgus and varus stress test. Passive extension and flexion intact. No sensory deficits. No erythema noted.   Skin:    General: Skin is warm and dry.     Findings: No erythema, lesion or rash.  Neurological:     Mental Status: She is alert.     Comments: Clear speech.  Psychiatric:        Behavior: Behavior normal.        Thought Content: Thought content normal.     ED Results / Procedures / Treatments   Labs (all labs ordered are listed, but only abnormal results are displayed) Labs Reviewed - No data to display  EKG None  Radiology DG Knee Complete 4 Views Left  Result Date: 01/21/2023 CLINICAL DATA:  Left leg pain and swelling EXAM: LEFT KNEE - COMPLETE 4 VIEW COMPARISON:  09/04/2022 FINDINGS: No evidence of fracture, dislocation, or joint effusion. No evidence of arthropathy or other focal bone abnormality. Soft tissues are unremarkable. IMPRESSION: Negative. Electronically Signed   By: Tiburcio Pea M.D.   On: 01/21/2023 07:31    Procedures Procedures    Medications Ordered in ED Medications  HYDROcodone-acetaminophen (NORCO/VICODIN) 5-325 MG per tablet 1 tablet (1 tablet Oral Given 01/21/23 2130)     ED Course/ Medical Decision Making/ A&P                                 Medical Decision Making Patient is a 37 year old female, history of lupus, here for left knee pain, this been going on for the last few months, but has gotten acutely worse, in the past week.  She states she is having difficult time bearing weight on it.  It is mildly swollen, with no erythema, and just a little bit of warmth.  She is having difficulty with active flexion and extension.  Denies any trauma other than twisting her knee the other day.  Pain is inferior and superior to patella.  We will obtain x-ray of her knee, as well as give her hydrocodone for the pain, she cannot take ibuprofen or naproxen given her lupus nephritis  Amount and/or Complexity of Data Reviewed Radiology: ordered.    Details: X-rays unremarkable Discussion of management or test interpretation with external provider(s): Discussed with patient, possibly bursitis, given the inflammation, from possible osteo?,  Versus inflammation amatory due to lupus.  She is not an IV drug user, has not had any fevers.  She is overall well-appearing.  Has not had any history of knee replacements in that knee.  This may be inflammatory, I offered her prednisone, and she declined.  We discussed follow-up with primary care doctor as well as orthopedics and likely physical therapy, we stressed the importance of icing the area, and using Tylenol for pain control.  Placed in knee immobilizer for increased support, as well as crutches, to help offload weight, as well as help with mobility.  Return precautions emphasized  Risk Prescription drug management.    Final Clinical Impression(s) / ED Diagnoses Final diagnoses:  Acute pain of left knee    Rx / DC Orders ED Discharge Orders     None         Celita Aron Elbert Ewings, PA 01/21/23 0838    Rexford Maus, DO 01/21/23 1503

## 2023-01-21 NOTE — Discharge Instructions (Addendum)
Please follow-up with your primary care doctor, and orthopedics as needed.  I recommend that you likely have physical therapy, and further orthopedic evaluation.  I believe that you may have inflammation of the bursa of your knee, which is causing the swelling, I think it is less likely to be a meniscal tear.  However it warrants further evaluation.  Please take Tylenol for the pain control, and try to off bear weight with the crutches.  Return to the ER if the swelling gets worse, and becomes red, hot to the touch or you develop a fever

## 2023-01-21 NOTE — ED Triage Notes (Signed)
Patient coming to ED for evaluation of L leg pain.  Report pain started suddenly after driving down from [place].  C/o pain in knee.  Increased warmth and swelling.  No reports of injury.  States "they have been giving me problems recently.
